# Patient Record
Sex: Male | Born: 1950 | Race: Black or African American | Hispanic: No | Smoking: Former smoker
Health system: Southern US, Community
[De-identification: ages and names within clinical notes are randomized; demographics above are authoritative.]

## PROBLEM LIST (undated history)

## (undated) DIAGNOSIS — Z86010 Personal history of colonic polyps: Secondary | ICD-10-CM

## (undated) DIAGNOSIS — C61 Malignant neoplasm of prostate: Secondary | ICD-10-CM

## (undated) DIAGNOSIS — M5030 Other cervical disc degeneration, unspecified cervical region: Secondary | ICD-10-CM

## (undated) DIAGNOSIS — Z8679 Personal history of other diseases of the circulatory system: Secondary | ICD-10-CM

## (undated) DIAGNOSIS — R972 Elevated prostate specific antigen [PSA]: Secondary | ICD-10-CM

## (undated) DIAGNOSIS — Z973 Presence of spectacles and contact lenses: Secondary | ICD-10-CM

## (undated) DIAGNOSIS — J449 Chronic obstructive pulmonary disease, unspecified: Secondary | ICD-10-CM

## (undated) DIAGNOSIS — Z87898 Personal history of other specified conditions: Secondary | ICD-10-CM

## (undated) DIAGNOSIS — K219 Gastro-esophageal reflux disease without esophagitis: Secondary | ICD-10-CM

## (undated) DIAGNOSIS — N138 Other obstructive and reflux uropathy: Secondary | ICD-10-CM

## (undated) DIAGNOSIS — N401 Enlarged prostate with lower urinary tract symptoms: Secondary | ICD-10-CM

## (undated) DIAGNOSIS — I2510 Atherosclerotic heart disease of native coronary artery without angina pectoris: Secondary | ICD-10-CM

## (undated) DIAGNOSIS — K5730 Diverticulosis of large intestine without perforation or abscess without bleeding: Secondary | ICD-10-CM

## (undated) HISTORY — PX: COLONOSCOPY: SHX174

## (undated) HISTORY — DX: Gastro-esophageal reflux disease without esophagitis: K21.9

## (undated) HISTORY — DX: Chronic obstructive pulmonary disease, unspecified: J44.9

## (undated) HISTORY — DX: Atherosclerotic heart disease of native coronary artery without angina pectoris: I25.10

## (undated) HISTORY — PX: CARDIAC CATHETERIZATION: SHX172

---

## 1998-05-05 ENCOUNTER — Ambulatory Visit (HOSPITAL_COMMUNITY): Admission: RE | Admit: 1998-05-05 | Discharge: 1998-05-05 | Payer: Self-pay | Admitting: Gastroenterology

## 1998-06-19 ENCOUNTER — Emergency Department (HOSPITAL_COMMUNITY): Admission: EM | Admit: 1998-06-19 | Discharge: 1998-06-19 | Payer: Self-pay | Admitting: Emergency Medicine

## 1998-09-21 ENCOUNTER — Emergency Department (HOSPITAL_COMMUNITY): Admission: EM | Admit: 1998-09-21 | Discharge: 1998-09-21 | Payer: Self-pay | Admitting: Emergency Medicine

## 1999-01-18 ENCOUNTER — Emergency Department (HOSPITAL_COMMUNITY): Admission: EM | Admit: 1999-01-18 | Discharge: 1999-01-18 | Payer: Self-pay

## 1999-02-28 ENCOUNTER — Encounter: Payer: Self-pay | Admitting: Emergency Medicine

## 1999-02-28 ENCOUNTER — Emergency Department (HOSPITAL_COMMUNITY): Admission: EM | Admit: 1999-02-28 | Discharge: 1999-03-01 | Payer: Self-pay | Admitting: Emergency Medicine

## 1999-03-15 ENCOUNTER — Emergency Department (HOSPITAL_COMMUNITY): Admission: EM | Admit: 1999-03-15 | Discharge: 1999-03-15 | Payer: Self-pay | Admitting: Emergency Medicine

## 1999-11-16 ENCOUNTER — Emergency Department (HOSPITAL_COMMUNITY): Admission: EM | Admit: 1999-11-16 | Discharge: 1999-11-16 | Payer: Self-pay | Admitting: *Deleted

## 2000-10-31 ENCOUNTER — Emergency Department (HOSPITAL_COMMUNITY): Admission: EM | Admit: 2000-10-31 | Discharge: 2000-10-31 | Payer: Self-pay | Admitting: Internal Medicine

## 2000-10-31 ENCOUNTER — Encounter: Payer: Self-pay | Admitting: Emergency Medicine

## 2000-11-06 ENCOUNTER — Encounter: Payer: Self-pay | Admitting: Gastroenterology

## 2000-11-06 ENCOUNTER — Ambulatory Visit (HOSPITAL_COMMUNITY): Admission: RE | Admit: 2000-11-06 | Discharge: 2000-11-06 | Payer: Self-pay | Admitting: Gastroenterology

## 2001-05-24 ENCOUNTER — Emergency Department (HOSPITAL_COMMUNITY): Admission: EM | Admit: 2001-05-24 | Discharge: 2001-05-24 | Payer: Self-pay | Admitting: Emergency Medicine

## 2001-05-29 ENCOUNTER — Emergency Department (HOSPITAL_COMMUNITY): Admission: EM | Admit: 2001-05-29 | Discharge: 2001-05-29 | Payer: Self-pay | Admitting: Emergency Medicine

## 2001-05-29 ENCOUNTER — Encounter: Payer: Self-pay | Admitting: Emergency Medicine

## 2001-06-12 ENCOUNTER — Encounter: Payer: Self-pay | Admitting: Gastroenterology

## 2001-06-12 ENCOUNTER — Encounter: Admission: RE | Admit: 2001-06-12 | Discharge: 2001-06-12 | Payer: Self-pay | Admitting: Gastroenterology

## 2002-03-21 ENCOUNTER — Emergency Department (HOSPITAL_COMMUNITY): Admission: EM | Admit: 2002-03-21 | Discharge: 2002-03-21 | Payer: Self-pay | Admitting: Emergency Medicine

## 2002-03-29 ENCOUNTER — Emergency Department (HOSPITAL_COMMUNITY): Admission: EM | Admit: 2002-03-29 | Discharge: 2002-03-29 | Payer: Self-pay | Admitting: Emergency Medicine

## 2002-04-14 ENCOUNTER — Emergency Department (HOSPITAL_COMMUNITY): Admission: EM | Admit: 2002-04-14 | Discharge: 2002-04-14 | Payer: Self-pay | Admitting: Emergency Medicine

## 2002-04-14 ENCOUNTER — Encounter: Payer: Self-pay | Admitting: *Deleted

## 2002-06-22 ENCOUNTER — Emergency Department (HOSPITAL_COMMUNITY): Admission: EM | Admit: 2002-06-22 | Discharge: 2002-06-22 | Payer: Self-pay | Admitting: Emergency Medicine

## 2002-06-22 ENCOUNTER — Encounter: Payer: Self-pay | Admitting: Emergency Medicine

## 2002-07-29 ENCOUNTER — Emergency Department (HOSPITAL_COMMUNITY): Admission: EM | Admit: 2002-07-29 | Discharge: 2002-07-29 | Payer: Self-pay | Admitting: Emergency Medicine

## 2002-07-29 ENCOUNTER — Encounter: Payer: Self-pay | Admitting: Emergency Medicine

## 2003-01-08 ENCOUNTER — Ambulatory Visit (HOSPITAL_COMMUNITY): Admission: RE | Admit: 2003-01-08 | Discharge: 2003-01-08 | Payer: Self-pay | Admitting: *Deleted

## 2003-01-08 ENCOUNTER — Encounter: Payer: Self-pay | Admitting: *Deleted

## 2003-01-18 ENCOUNTER — Emergency Department (HOSPITAL_COMMUNITY): Admission: EM | Admit: 2003-01-18 | Discharge: 2003-01-18 | Payer: Self-pay | Admitting: Emergency Medicine

## 2003-02-14 ENCOUNTER — Emergency Department (HOSPITAL_COMMUNITY): Admission: EM | Admit: 2003-02-14 | Discharge: 2003-02-14 | Payer: Self-pay | Admitting: Emergency Medicine

## 2003-02-14 ENCOUNTER — Encounter: Payer: Self-pay | Admitting: Emergency Medicine

## 2003-06-02 ENCOUNTER — Emergency Department (HOSPITAL_COMMUNITY): Admission: EM | Admit: 2003-06-02 | Discharge: 2003-06-02 | Payer: Self-pay | Admitting: Emergency Medicine

## 2003-06-12 ENCOUNTER — Emergency Department (HOSPITAL_COMMUNITY): Admission: EM | Admit: 2003-06-12 | Discharge: 2003-06-12 | Payer: Self-pay | Admitting: Emergency Medicine

## 2003-06-13 ENCOUNTER — Emergency Department (HOSPITAL_COMMUNITY): Admission: EM | Admit: 2003-06-13 | Discharge: 2003-06-13 | Payer: Self-pay | Admitting: Emergency Medicine

## 2005-11-06 ENCOUNTER — Emergency Department (HOSPITAL_COMMUNITY): Admission: EM | Admit: 2005-11-06 | Discharge: 2005-11-06 | Payer: Self-pay | Admitting: Emergency Medicine

## 2005-12-04 ENCOUNTER — Emergency Department (HOSPITAL_COMMUNITY): Admission: EM | Admit: 2005-12-04 | Discharge: 2005-12-04 | Payer: Self-pay | Admitting: Emergency Medicine

## 2006-05-01 ENCOUNTER — Emergency Department (HOSPITAL_COMMUNITY): Admission: EM | Admit: 2006-05-01 | Discharge: 2006-05-02 | Payer: Self-pay | Admitting: Emergency Medicine

## 2007-06-30 ENCOUNTER — Emergency Department (HOSPITAL_COMMUNITY): Admission: EM | Admit: 2007-06-30 | Discharge: 2007-06-30 | Payer: Self-pay | Admitting: Emergency Medicine

## 2007-11-15 ENCOUNTER — Emergency Department (HOSPITAL_COMMUNITY): Admission: EM | Admit: 2007-11-15 | Discharge: 2007-11-15 | Payer: Self-pay | Admitting: Emergency Medicine

## 2007-11-22 ENCOUNTER — Emergency Department (HOSPITAL_COMMUNITY): Admission: EM | Admit: 2007-11-22 | Discharge: 2007-11-22 | Payer: Self-pay | Admitting: Emergency Medicine

## 2007-12-14 ENCOUNTER — Emergency Department (HOSPITAL_COMMUNITY): Admission: EM | Admit: 2007-12-14 | Discharge: 2007-12-14 | Payer: Self-pay | Admitting: Emergency Medicine

## 2007-12-23 DIAGNOSIS — J449 Chronic obstructive pulmonary disease, unspecified: Secondary | ICD-10-CM

## 2007-12-23 DIAGNOSIS — J45909 Unspecified asthma, uncomplicated: Secondary | ICD-10-CM | POA: Insufficient documentation

## 2007-12-23 DIAGNOSIS — J4489 Other specified chronic obstructive pulmonary disease: Secondary | ICD-10-CM | POA: Insufficient documentation

## 2007-12-23 DIAGNOSIS — K219 Gastro-esophageal reflux disease without esophagitis: Secondary | ICD-10-CM | POA: Insufficient documentation

## 2007-12-24 ENCOUNTER — Ambulatory Visit: Payer: Self-pay | Admitting: Internal Medicine

## 2007-12-24 DIAGNOSIS — I10 Essential (primary) hypertension: Secondary | ICD-10-CM | POA: Insufficient documentation

## 2008-02-23 ENCOUNTER — Emergency Department (HOSPITAL_COMMUNITY): Admission: EM | Admit: 2008-02-23 | Discharge: 2008-02-24 | Payer: Self-pay | Admitting: Emergency Medicine

## 2008-03-05 ENCOUNTER — Emergency Department (HOSPITAL_COMMUNITY): Admission: EM | Admit: 2008-03-05 | Discharge: 2008-03-06 | Payer: Self-pay | Admitting: Emergency Medicine

## 2008-03-22 ENCOUNTER — Telehealth (INDEPENDENT_AMBULATORY_CARE_PROVIDER_SITE_OTHER): Payer: Self-pay | Admitting: *Deleted

## 2008-03-30 ENCOUNTER — Emergency Department (HOSPITAL_COMMUNITY): Admission: EM | Admit: 2008-03-30 | Discharge: 2008-03-30 | Payer: Self-pay | Admitting: Emergency Medicine

## 2008-07-23 ENCOUNTER — Ambulatory Visit: Payer: Self-pay | Admitting: Internal Medicine

## 2008-11-08 ENCOUNTER — Telehealth (INDEPENDENT_AMBULATORY_CARE_PROVIDER_SITE_OTHER): Payer: Self-pay | Admitting: *Deleted

## 2008-12-14 ENCOUNTER — Telehealth (INDEPENDENT_AMBULATORY_CARE_PROVIDER_SITE_OTHER): Payer: Self-pay | Admitting: *Deleted

## 2009-02-14 ENCOUNTER — Emergency Department (HOSPITAL_COMMUNITY): Admission: EM | Admit: 2009-02-14 | Discharge: 2009-02-14 | Payer: Self-pay | Admitting: Emergency Medicine

## 2009-05-30 ENCOUNTER — Telehealth: Payer: Self-pay | Admitting: Internal Medicine

## 2009-11-29 ENCOUNTER — Emergency Department (HOSPITAL_COMMUNITY): Admission: EM | Admit: 2009-11-29 | Discharge: 2009-11-30 | Payer: Self-pay | Admitting: Emergency Medicine

## 2010-08-05 ENCOUNTER — Emergency Department (HOSPITAL_COMMUNITY): Admission: EM | Admit: 2010-08-05 | Discharge: 2010-08-05 | Payer: Self-pay | Admitting: Emergency Medicine

## 2010-11-21 LAB — BASIC METABOLIC PANEL WITH GFR
BUN: 5 mg/dL — ABNORMAL LOW (ref 6–23)
CO2: 30 meq/L (ref 19–32)
Calcium: 9.2 mg/dL (ref 8.4–10.5)
Chloride: 101 meq/L (ref 96–112)
Creatinine, Ser: 0.91 mg/dL (ref 0.4–1.5)
GFR calc non Af Amer: >60 mL/min
Glucose, Bld: 72 mg/dL (ref 70–99)
Potassium: 3.7 meq/L (ref 3.5–5.1)
Sodium: 136 meq/L (ref 135–145)

## 2010-11-21 LAB — CBC
HCT: 40.2 % (ref 39.0–52.0)
Hemoglobin: 13.5 g/dL (ref 13.0–17.0)
MCH: 27.6 pg (ref 26.0–34.0)
MCHC: 33.6 g/dL (ref 30.0–36.0)
MCV: 82 fL (ref 78.0–100.0)
Platelets: 226 K/uL (ref 150–400)
RBC: 4.9 MIL/uL (ref 4.22–5.81)
RDW: 13 % (ref 11.5–15.5)
WBC: 4 K/uL (ref 4.0–10.5)

## 2010-11-21 LAB — CK TOTAL AND CKMB (NOT AT ARMC)
Relative Index: 1.7 (ref 0.0–2.5)
Total CK: 111 U/L (ref 7–232)

## 2010-11-21 LAB — DIFFERENTIAL
Basophils Relative: 2 % — ABNORMAL HIGH (ref 0–1)
Monocytes Relative: 10 % (ref 3–12)
Neutrophils Relative %: 52 % (ref 43–77)

## 2010-11-21 LAB — TROPONIN I: Troponin I: 0.02 ng/mL (ref 0.00–0.06)

## 2010-12-04 LAB — CBC
Hemoglobin: 13 g/dL (ref 13.0–17.0)
MCHC: 33.4 g/dL (ref 30.0–36.0)
MCV: 85.8 fL (ref 78.0–100.0)
Platelets: 200 10*3/uL (ref 150–400)
RBC: 4.52 MIL/uL (ref 4.22–5.81)
RDW: 13.6 % (ref 11.5–15.5)

## 2010-12-04 LAB — DIFFERENTIAL
Basophils Absolute: 0 10*3/uL (ref 0.0–0.1)
Eosinophils Absolute: 0.2 10*3/uL (ref 0.0–0.7)
Eosinophils Relative: 7 % — ABNORMAL HIGH (ref 0–5)
Lymphs Abs: 1.4 10*3/uL (ref 0.7–4.0)
Neutro Abs: 1.4 10*3/uL — ABNORMAL LOW (ref 1.7–7.7)
Neutrophils Relative %: 42 % — ABNORMAL LOW (ref 43–77)

## 2010-12-04 LAB — BASIC METABOLIC PANEL
BUN: 7 mg/dL (ref 6–23)
CO2: 25 mEq/L (ref 19–32)
GFR calc non Af Amer: >60 mL/min (ref >60)
Glucose, Bld: 98 mg/dL (ref 70–99)
Sodium: 135 mEq/L (ref 135–145)

## 2010-12-04 LAB — TROPONIN I: Troponin I: 0.04 ng/mL (ref 0.00–0.06)

## 2010-12-04 LAB — CK TOTAL AND CKMB (NOT AT ARMC): CK, MB: 2.9 ng/mL (ref 0.3–4.0)

## 2011-01-26 NOTE — Cardiovascular Report (Signed)
[name], [name]                           ACCOUNT NO.:  [id number]   MEDICAL RECORD NO.:  [id number]                   PATIENT TYPE:  OIB   LOCATION:  2860                                 FACILITY:  [facility]   PHYSICIAN:  Veneda Melter, M.D.                   DATE OF BIRTH:  Dec 12, 1950   DATE OF PROCEDURE:  01/08/2003  DATE OF DISCHARGE:  01/08/2003                              CARDIAC CATHETERIZATION   PROCEDURE PERFORMED:  1. Left heart catheterization.  2. Left ventriculogram.  3. Selective coronary angiography.  4. Attempted placement, Perclose, right femoral artery.   DIAGNOSES:  1. Mild coronary artery disease.  2. Normal left ventricular systolic function.   HISTORY:  The patient is a 60 year old black gentleman who presents with  substernal chest discomfort and shortness of breath.  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  He presents for further assessment.   TECHNIQUE:  Informed consent was obtained.  The patient was brought to the  catheterization lab.  A 6-French sheath was placed in the right femoral  artery using the modified Seldinger technique.  JL4 and JR4 6-French  catheters were then used to engage the left and right coronary arteries, and  selective angiography was performed in various projections using manual  injections of contrast.  A 6-French pigtail catheter was then advanced to  the left ventricle, and a left ventriculogram performed using power  injections of contrast.  At the termination of the case, an attempt was made  to place a Perclose suture closure device in the right femoral artery;  however, hemostasis could not be achieved, and manual pressure was applied.  Hemostasis was then achieved, and the patient was transferred to the  outpatient center in stable condition.  Findings are as follows:   FINDINGS:  1. Left main trunk:  Angiographically normal.  2. LAD:  This is a medium caliber vessel that provides 3 small diagonal     branches in its mid section.  The LAD has mild narrowing of 30% in the     mid section.  The remainder of the vessel has luminal irregularities.  3. Left circumflex artery:  This is a medium caliber vessel that provides 3     marginal branches in the proximal segment.  The left circumflex system     has mild irregularities.  4. Right coronary artery:  Dominant.  This is a medium caliber vessel that     provides a posterior descending artery and posterior ventricular branch     in the terminal segment.  The right coronary artery has luminal disease.  5. LV:  Normal end systolic and end diastolic dimensions.  Overall left     ventricular function appears well preserved.  Ejection fraction of     greater than 55%.  No  mitral regurgitation.   HEMODYNAMICS:  1. LV pressure is 140/0.  2. Aortic is 140/80.  3. LVEDP equals 20.    ASSESSMENT AND PLAN:  The patient is a 60 year old gentleman with mild  coronary artery disease and normal LV function.  Continued medical therapy  will be recommended and other causes of his symptoms investigated.                                                Veneda Melter, M.D.    Melton Alar  D:  01/08/2003  T:  01/09/2003  Job:  [id number]   cc:   Ulyess Mort, M.D. [facility]   Lindaann Slough, M.D.  [address]  [place]  [place] [address]  Fax: [phone]

## 2011-02-22 ENCOUNTER — Emergency Department (HOSPITAL_COMMUNITY)
Admission: EM | Admit: 2011-02-22 | Discharge: 2011-02-22 | Disposition: A | Discharge status: Home / Self Care | Payer: BC Managed Care – PPO | Attending: Emergency Medicine | Admitting: Emergency Medicine

## 2011-02-22 DIAGNOSIS — S61209A Unspecified open wound of unspecified finger without damage to nail, initial encounter: Secondary | ICD-10-CM | POA: Insufficient documentation

## 2011-02-22 DIAGNOSIS — W260XXA Contact with knife, initial encounter: Secondary | ICD-10-CM | POA: Insufficient documentation

## 2011-02-22 DIAGNOSIS — I2510 Atherosclerotic heart disease of native coronary artery without angina pectoris: Secondary | ICD-10-CM | POA: Insufficient documentation

## 2011-02-22 DIAGNOSIS — I519 Heart disease, unspecified: Secondary | ICD-10-CM | POA: Insufficient documentation

## 2011-02-22 DIAGNOSIS — J45909 Unspecified asthma, uncomplicated: Secondary | ICD-10-CM | POA: Insufficient documentation

## 2011-02-22 DIAGNOSIS — K219 Gastro-esophageal reflux disease without esophagitis: Secondary | ICD-10-CM | POA: Insufficient documentation

## 2011-02-22 DIAGNOSIS — M79609 Pain in unspecified limb: Secondary | ICD-10-CM | POA: Insufficient documentation

## 2011-02-22 DIAGNOSIS — W261XXA Contact with sword or dagger, initial encounter: Secondary | ICD-10-CM | POA: Insufficient documentation

## 2011-02-22 DIAGNOSIS — I10 Essential (primary) hypertension: Secondary | ICD-10-CM | POA: Insufficient documentation

## 2011-05-16 ENCOUNTER — Encounter: Payer: Self-pay | Admitting: Internal Medicine

## 2011-06-04 LAB — I-STAT 8, (EC8 V) (CONVERTED LAB)
Chloride: 105
Glucose, Bld: 107 — ABNORMAL HIGH
Potassium: 3.6
TCO2: 27

## 2011-06-04 LAB — DIFFERENTIAL
Basophils Absolute: 0.1
Basophils Relative: 1
Eosinophils Absolute: 0.4
Lymphocytes Relative: 38
Lymphs Abs: 2
Monocytes Absolute: 0.4
Neutrophils Relative %: 45

## 2011-06-04 LAB — POCT CARDIAC MARKERS
CKMB, poc: 2.4
Myoglobin, poc: 67
Operator id: 294341
Troponin i, poc: <0.05

## 2011-06-04 LAB — CBC
MCV: 84
Platelets: 238
WBC: 5.2

## 2011-06-04 LAB — PROTIME-INR
INR: 1
Prothrombin Time: 13

## 2011-06-05 LAB — CBC
HCT: 41.2
Hemoglobin: 13.7
MCHC: 33.2
MCV: 84
RBC: 4.91
RDW: 13.9
WBC: 4.5

## 2011-06-05 LAB — POCT I-STAT, CHEM 8
Calcium, Ion: 1.21
Chloride: 104
Creatinine, Ser: 1
Hemoglobin: 15
Sodium: 142

## 2011-06-05 LAB — DIFFERENTIAL
Basophils Absolute: 0.1
Basophils Relative: 3 — ABNORMAL HIGH
Monocytes Relative: 11
Neutro Abs: 1.7

## 2011-06-05 LAB — POCT CARDIAC MARKERS: Troponin i, poc: <0.05

## 2011-06-26 ENCOUNTER — Encounter: Payer: Self-pay | Admitting: Internal Medicine

## 2011-08-01 ENCOUNTER — Encounter: Payer: Self-pay | Admitting: Internal Medicine

## 2011-08-01 ENCOUNTER — Ambulatory Visit (INDEPENDENT_AMBULATORY_CARE_PROVIDER_SITE_OTHER): Payer: BC Managed Care – PPO | Admitting: Internal Medicine

## 2011-08-01 VITALS — BP 124/90 | HR 72 | Ht 63.0 in | Wt 113.6 lb

## 2011-08-01 DIAGNOSIS — I2510 Atherosclerotic heart disease of native coronary artery without angina pectoris: Secondary | ICD-10-CM

## 2011-08-01 DIAGNOSIS — K219 Gastro-esophageal reflux disease without esophagitis: Secondary | ICD-10-CM

## 2011-08-01 DIAGNOSIS — Z86010 Personal history of colonic polyps: Secondary | ICD-10-CM

## 2011-08-01 DIAGNOSIS — J449 Chronic obstructive pulmonary disease, unspecified: Secondary | ICD-10-CM

## 2011-08-01 NOTE — Progress Notes (Signed)
HISTORY OF PRESENT ILLNESS:  [name] is a 60 y.o. male with hypertension, coronary artery disease, COPD, and GERD. He presents to the regarding surveillance colonoscopy. As well some complaints of acid reflux. The patient has previously been under the GI care of Dr. Terrial Rhodes. He has had multiple colonoscopies with documented adenomatous polyps, dating back to 24. Most recent colonoscopy performed September 2005. No polyps at that time. Followup in 7 years recommended. The patient denies any lower GI complaints. He does complain of intermittent pyrosis and regurgitation. No dysphagia. He has also undergone previous upper endoscopy with esophageal dilation. His chronic medical problems are reported as stable.  REVIEW OF SYSTEMS:  All non-GI ROS negative except for difficulty initiating urinary stream  Past Medical History  Diagnosis Date  . Hypertension   . COPD (chronic obstructive pulmonary disease)   . CAD (coronary artery disease)   . GERD (gastroesophageal reflux disease)     No past surgical history on file.  Social History [name]  reports that he has quit smoking. He has never used smokeless tobacco. He reports that he does not drink alcohol or use illicit drugs.  family history includes Heart disease in his brother and sister.  No Known Allergies     PHYSICAL EXAMINATION: Vital signs: BP 124/90  Pulse 72  Ht 5\' 3"  (1.6 m)  Wt 113 lb 9.6 oz (51.529 kg)  BMI 20.12 kg/m2  Constitutional: generally well-appearing, no acute distress Psychiatric: alert and oriented x3, cooperative Eyes: extraocular movements intact, anicteric, conjunctiva pink Mouth: oral pharynx moist, no lesions Neck: supple no lymphadenopathy Cardiovascular: heart regular rate and rhythm, no murmur Lungs: clear to auscultation bilaterally Abdomen: soft, nontender, nondistended, no obvious ascites, no peritoneal signs, normal bowel sounds, no organomegaly Rectal: Deferred until  colonoscopy Extremities: no lower extremity edema bilaterally Skin: no lesions on visible extremities Neuro: No focal deficits.   ASSESSMENT:  #1. GERD without alarm features #2. History of adenomatous colon polyps. Due for surveillance #3. Multiple significant medical problems, stable   PLAN:  #1. Reflux precautions #2. Prilosec OTC on demand #3. Surveillance colonoscopy.The nature of the procedure, as well as the risks, benefits, and alternatives were carefully and thoroughly reviewed with the patient. Ample time for discussion and questions allowed. The patient understood, was satisfied, and agreed to proceed. The prep prescribed is Movi prep. The patient instructed on its use #4. Ongoing general medical care with your  PCP

## 2011-08-01 NOTE — Patient Instructions (Signed)
You have been scheduled for a colonoscopy. Please follow written instructions given to you at your visit today.  Please pick up your prep kit at the pharmacy within the next 2-3 days. 

## 2011-08-12 ENCOUNTER — Emergency Department (HOSPITAL_COMMUNITY): Payer: BC Managed Care – PPO

## 2011-08-12 ENCOUNTER — Emergency Department (HOSPITAL_COMMUNITY)
Admission: EM | Admit: 2011-08-12 | Discharge: 2011-08-12 | Disposition: A | Discharge status: Home / Self Care | Payer: BC Managed Care – PPO | Attending: Emergency Medicine | Admitting: Emergency Medicine

## 2011-08-12 ENCOUNTER — Encounter (HOSPITAL_COMMUNITY): Payer: Self-pay

## 2011-08-12 DIAGNOSIS — R059 Cough, unspecified: Secondary | ICD-10-CM | POA: Insufficient documentation

## 2011-08-12 DIAGNOSIS — R51 Headache: Secondary | ICD-10-CM

## 2011-08-12 DIAGNOSIS — R05 Cough: Secondary | ICD-10-CM | POA: Insufficient documentation

## 2011-08-12 DIAGNOSIS — R0602 Shortness of breath: Secondary | ICD-10-CM | POA: Insufficient documentation

## 2011-08-12 DIAGNOSIS — J4489 Other specified chronic obstructive pulmonary disease: Secondary | ICD-10-CM | POA: Insufficient documentation

## 2011-08-12 DIAGNOSIS — J449 Chronic obstructive pulmonary disease, unspecified: Secondary | ICD-10-CM

## 2011-08-12 DIAGNOSIS — I10 Essential (primary) hypertension: Secondary | ICD-10-CM | POA: Insufficient documentation

## 2011-08-12 MED ORDER — METOCLOPRAMIDE HCL 5 MG/ML IJ SOLN
10.0000 mg | Freq: Once | INTRAMUSCULAR | Status: DC
  Filled 2011-08-12: qty 2

## 2011-08-12 MED ORDER — ALBUTEROL SULFATE HFA 108 (90 BASE) MCG/ACT IN AERS
2.0000 | INHALATION_SPRAY | Freq: Once | RESPIRATORY_TRACT | Status: AC
  Administered 2011-08-12: 2 via RESPIRATORY_TRACT
  Filled 2011-08-12: qty 6.7

## 2011-08-12 MED ORDER — METOCLOPRAMIDE HCL 5 MG/ML IJ SOLN
10.0000 mg | Freq: Once | INTRAMUSCULAR | Status: AC
  Administered 2011-08-12: 10 mg via INTRAMUSCULAR

## 2011-08-12 NOTE — ED Provider Notes (Signed)
Medical screening examination/treatment/procedure(s) were conducted as a shared visit with non-physician practitioner(s) and myself.  I personally evaluated the patient during the encounter  60 year old male complains of a headache for the last week.  No other symptoms.  No fever.  No history of trauma.  He denies dizziness, vision changes, weakness in his arms or legs.  He denies nausea or vomiting.  He rarely gets headaches.  His neurological examination and mental status are completely normal.  Head CT shows asymmetry of the lateral ventricles, but no other abnormalities.  Consultation with the neurosurgeon was performed and the recommendation was that he gets no further radiographic testing  Nicholes Stairs, MD 08/12/11 1345

## 2011-08-12 NOTE — ED Notes (Signed)
Pt in from home with cold like sx and generalized body aches x1 week denies n/v/d no apparent distress

## 2011-08-12 NOTE — ED Provider Notes (Signed)
History     CSN: [id number] Arrival date & time: 08/12/2011  8:36 AM   First MD Initiated Contact with Patient 08/12/11 1008      Chief Complaint  Patient presents with  . URI    (Consider location/radiation/quality/duration/timing/severity/associated sxs/prior treatment) HPI History provided by pt.   Pt has had a severe, intermittent headache for the past 7 days.  Has temporary relief w/ tylenol.  No associated sx including fever, dizziness, blurred vision, photo/phonophobia, N/V, weakness, paresthesias.  Has not had a headache like this in several yrs.  No h/o migraines.  Denies head trauma.  Also c/o cough; onset approx same time as headache.  Non-productive.  Associated w/ mild, exertional SOB, wheezing, rhinorrhea and right ear pain.  H/o COPD and this feels like an exacerbation. Also has HTN for which he is non-compliant w/ medications.  Otherwise healthy.  No h/o cancer.  Past Medical History  Diagnosis Date  . Hypertension   . COPD (chronic obstructive pulmonary disease)   . CAD (coronary artery disease)   . GERD (gastroesophageal reflux disease)     History reviewed. No pertinent past surgical history.  Family History  Problem Relation Age of Onset  . Heart disease Brother   . Heart disease Sister     History  Substance Use Topics  . Smoking status: Former Games developer  . Smokeless tobacco: Never Used   Comment: Quit in 1988  . Alcohol Use: No      Review of Systems  All other systems reviewed and are negative.    Allergies  Review of patient's allergies indicates no known allergies.  Home Medications   Current Outpatient Rx  Name Route Sig Dispense Refill  . NON FORMULARY  Prostate medication, patient unable to remember name       BP 142/98  Pulse 75  Temp(Src) 98.6 F (37 C) (Oral)  Resp 20  SpO2 96%  Physical Exam  Nursing note and vitals reviewed. Constitutional: He is oriented to person, place, and time. He appears well-developed and  well-nourished.  HENT:  Head: Normocephalic and atraumatic.  Right Ear: External ear normal.       Right TM nml.  No edema, erythema or tenderness of mastoid.  Pinna nml.   Eyes:       Normal appearance  Neck: Normal range of motion. Neck supple. No rigidity. No Brudzinski's sign and no Kernig's sign noted.  Cardiovascular: Normal rate and regular rhythm.   Pulmonary/Chest: Effort normal and breath sounds normal.  Neurological: He is alert and oriented to person, place, and time. No cranial nerve deficit or sensory deficit. Coordination normal.       5/5 and equal upper and lower extremity strength.  No past pointing.  Nml gait.  Skin: Skin is warm and dry. No rash noted.  Psychiatric: He has a normal mood and affect. His behavior is normal.    ED Course  Procedures (including critical care time)  Date: 08/12/2011  Rate: 67  Rhythm: normal sinus rhythm  QRS Axis: left  Intervals: normal  ST/T Wave abnormalities: normal  Conduction Disutrbances:left anterior fascicular block and early precordial R/S transition  Narrative Interpretation:   Old EKG Reviewed: none available   Labs Reviewed - No data to display Dg Chest 2 View  08/12/2011  *RADIOLOGY REPORT*  Clinical Data: Cough, shortness of breath.  CHEST - 2 VIEW  Comparison: 08/05/2010  Findings: Mild hyperaeration, stable.  Peribronchial thickening again noted, also stable.  Heart is normal size.  Lungs are clear. No effusions or acute bony abnormality.  IMPRESSION: Suspect chronic bronchitic changes or asthma.  No active disease.  Original Report Authenticated By: Cyndie Chime, M.D.   Ct Head Wo Contrast  08/12/2011  *RADIOLOGY REPORT*  Clinical Data: Headache.  CT HEAD WITHOUT CONTRAST  Technique:  Contiguous axial images were obtained from the base of the skull through the vertex without contrast.  Comparison: None.  Findings: There is asymmetric size of the lateral ventricles with the left lateral ventricle slightly larger  than the right.  This is likely a normal variant or related to intraventricular cyst. If there are is concern for a increased intracranial pressure, MRI may be helpful.  No acute infarct or hemorrhage.  No mass lesion.  No acute calvarial abnormality. Visualized paranasal sinuses and mastoids clear.  Orbital soft tissues unremarkable.  IMPRESSION: Asymmetric size of the lateral ventricles, left larger than right. This may be a normal variant or related to intraventricular cyst. If there is concern that the patient's headache may be related to increased intracranial pressure, MRI may be helpful.  Original Report Authenticated By: Cyndie Chime, M.D.     1. Headache   2. COPD (chronic obstructive pulmonary disease)       MDM  Pt presents w/ diffuse, intermittent, non-traumatic headache x 1 week.  Afebrile, no meningeal signs or neuro deficits on exam.  B/c new onset and age of pt, CT head ordered and pending.  Pt to received 10mg  IM reglan for pain.  Also c/o non-productive cough x 1wk.  Associated w/ mild, exertional SOB and wheezing.  Feels like a COPD exacerbation.  CXR pending.  CXR shows chronic bronchitis changes; no pneumonia.  CT head shows asymmetric size lateral ventricles; nml variant vs. Intraventricular cyst.  Consulted Dr. Newell Coral who has reviewed his CT and does not believe he needs an MRI nor outpatient NS f/u.  Discussed w/ pt.  Return precautions given.  Received an albuterol inhaler for cough.       Arie Sabina [place], [place] 08/12/11 [phone]

## 2011-08-20 ENCOUNTER — Telehealth: Payer: Self-pay | Admitting: Internal Medicine

## 2011-08-20 NOTE — Telephone Encounter (Signed)
No charge. 

## 2011-08-22 ENCOUNTER — Other Ambulatory Visit: Payer: BC Managed Care – PPO | Admitting: Internal Medicine

## 2012-05-29 ENCOUNTER — Encounter: Payer: Self-pay | Admitting: Internal Medicine

## 2012-10-07 ENCOUNTER — Encounter (HOSPITAL_COMMUNITY): Payer: Self-pay | Admitting: Emergency Medicine

## 2012-10-07 ENCOUNTER — Emergency Department (HOSPITAL_COMMUNITY): Payer: BC Managed Care – PPO

## 2012-10-07 ENCOUNTER — Emergency Department (HOSPITAL_COMMUNITY)
Admission: EM | Admit: 2012-10-07 | Discharge: 2012-10-07 | Disposition: A | Discharge status: Home / Self Care | Payer: BC Managed Care – PPO | Attending: Emergency Medicine | Admitting: Emergency Medicine

## 2012-10-07 DIAGNOSIS — Z87891 Personal history of nicotine dependence: Secondary | ICD-10-CM | POA: Insufficient documentation

## 2012-10-07 DIAGNOSIS — J449 Chronic obstructive pulmonary disease, unspecified: Secondary | ICD-10-CM | POA: Insufficient documentation

## 2012-10-07 DIAGNOSIS — I2510 Atherosclerotic heart disease of native coronary artery without angina pectoris: Secondary | ICD-10-CM | POA: Insufficient documentation

## 2012-10-07 DIAGNOSIS — M25512 Pain in left shoulder: Secondary | ICD-10-CM

## 2012-10-07 DIAGNOSIS — Z8719 Personal history of other diseases of the digestive system: Secondary | ICD-10-CM | POA: Insufficient documentation

## 2012-10-07 DIAGNOSIS — Z79899 Other long term (current) drug therapy: Secondary | ICD-10-CM | POA: Insufficient documentation

## 2012-10-07 DIAGNOSIS — M25519 Pain in unspecified shoulder: Secondary | ICD-10-CM | POA: Insufficient documentation

## 2012-10-07 DIAGNOSIS — J4489 Other specified chronic obstructive pulmonary disease: Secondary | ICD-10-CM | POA: Insufficient documentation

## 2012-10-07 DIAGNOSIS — I10 Essential (primary) hypertension: Secondary | ICD-10-CM | POA: Insufficient documentation

## 2012-10-07 MED ORDER — DIAZEPAM 5 MG PO TABS: 5.0000 mg | ORAL_TABLET | Freq: Four times a day (QID) | ORAL | Status: DC | PRN

## 2012-10-07 MED ORDER — IBUPROFEN 800 MG PO TABS
800.0000 mg | ORAL_TABLET | Freq: Once | ORAL | Status: AC
  Administered 2012-10-07: 800 mg via ORAL
  Filled 2012-10-07: qty 1

## 2012-10-07 NOTE — ED Provider Notes (Signed)
History     CSN: [id number]  Arrival date & time 10/07/12  4098   None     Chief Complaint  Patient presents with  . Joint Pain    (Consider location/radiation/quality/duration/timing/severity/associated sxs/prior treatment) HPI  Stetson Pelaez is a 62 y.o. male complaining of right shoulder pain x3 days after raking. Pain is exacerbated by movement, it is worsened the evenings and improved in the a.m. He denies any other changes joint pain, he endorses a reduced range of motion denies numbness or paresthesia. Pain is rated at 10 out of 10 at worst. Thermal patches and APAP not helpful. No prior history of trauma to this shoulder.   Past Medical History  Diagnosis Date  . Hypertension   . COPD (chronic obstructive pulmonary disease)   . CAD (coronary artery disease)   . GERD (gastroesophageal reflux disease)     History reviewed. No pertinent past surgical history.  Family History  Problem Relation Age of Onset  . Heart disease Brother   . Heart disease Sister     History  Substance Use Topics  . Smoking status: Former Games developer  . Smokeless tobacco: Never Used     Comment: Quit in 1988  . Alcohol Use: No      Review of Systems  Constitutional: Negative for fever.  Respiratory: Negative for shortness of breath.   Cardiovascular: Negative for chest pain.  Gastrointestinal: Negative for nausea, vomiting, abdominal pain and diarrhea.  Musculoskeletal: Positive for arthralgias.  All other systems reviewed and are negative.    Allergies  Review of patient's allergies indicates no known allergies.  Home Medications   Current Outpatient Rx  Name  Route  Sig  Dispense  Refill  . ACETAMINOPHEN 500 MG PO TABS   Oral   Take 500 mg by mouth every 6 (six) hours as needed. For headache.          . ALBUTEROL SULFATE HFA 108 (90 BASE) MCG/ACT IN AERS   Inhalation   Inhale 2 puffs into the lungs every 4 (four) hours as needed. For shortness of breath.          Marland Kitchen  NON FORMULARY      Prostate medication, patient unable to remember name         . OVER THE COUNTER MEDICATION   Oral   Take 1 capsule by mouth daily. Prostate medication.          . TETRAHYDROZOLINE HCL 0.05 % OP SOLN   Both Eyes   Place 2 drops into both eyes as needed. For dry eyes.            BP 160/91  Pulse 68  Temp 98.8 F (37.1 C) (Oral)  Resp 14  SpO2 100%  Physical Exam  Nursing note and vitals reviewed. Constitutional: He is oriented to person, place, and time. He appears well-developed and well-nourished. No distress.  HENT:  Head: Normocephalic.  Eyes: Conjunctivae normal and EOM are normal.  Cardiovascular: Normal rate.   Pulmonary/Chest: Effort normal. No stridor.  Musculoskeletal: Normal range of motion.       Patient able to abduct left shoulder greater than 90 however it induces pain. Drop arm is negative. There is no swelling, erythema or warmth to the joint. Distal sensation is intact and radial pulses are 2+ bilaterally. Patient is diffusely tender to palpation of posterior shoulder up into the neck.  Neurological: He is alert and oriented to person, place, and time.  Psychiatric: He has a normal  mood and affect.    ED Course  Procedures (including critical care time)  Labs Reviewed - No data to display Dg Shoulder Right  10/07/2012  *RADIOLOGY REPORT*  Clinical Data: Joint pain, injured 2 weeks ago  RIGHT SHOULDER - 2+ VIEW  Comparison: Prior chest x-ray 08/12/2011.  Findings: Multiple views of the right shoulder including internal, external and scapular Y views are submitted for interpretation.  No evidence of acute fracture or malalignment.  The humeral head is located with respect to the glenoid.  On the internally rotated view there is some faint high density material in the region of the rotator cuff insertion which may represent some calcific tendinopathy.  The visualized lungs are clear.  Cardiac and mediastinal contours within normal limits.   Bony mineralization is within normal limits.  No aggressive lytic or blastic bony lesion identified.  IMPRESSION:  1.  No acute fracture or malalignment 2.  On a single view there is some relative high density in the region of the rotator cuff insertion which may represent developing calcific tendinopathy.   Original Report Authenticated By: Malachy Moan, M.D.      1. Arthralgia of left shoulder region       MDM  Physical exam is benign showing no issues with the rotator cuff or other physical abnormalities are reduced range of motion and tenderness. X-ray shows no bony abnormalities. This likely muscle spasm. I will refer him to or so and treatment such.   Pt verbalized understanding and agrees with care plan. Outpatient follow-up and return precautions given.    New Prescriptions   DIAZEPAM (VALIUM) 5 MG TABLET    Take 1 tablet (5 mg total) by mouth every 6 (six) hours as needed (spasms).          Wynetta Emery, PA-C 10/07/12 [phone]

## 2012-10-07 NOTE — ED Notes (Signed)
TRANSPORTED TO X-RAY. 

## 2012-10-07 NOTE — ED Notes (Signed)
PT. REPORTS RIGHT SHOULDER JOINT PAIN FOR 3 DAYS , PAIN WORSE WITH MOVEMENT  AND CERTAIN POSITIONS, DENIES INJURY OR FALL.

## 2012-10-07 NOTE — ED Provider Notes (Signed)
Medical screening examination/treatment/procedure(s) were performed by non-physician practitioner and as supervising physician I was immediately available for consultation/collaboration.  Olivia Mackie, MD 10/07/12 1530

## 2012-12-15 ENCOUNTER — Emergency Department (HOSPITAL_COMMUNITY)
Admission: EM | Admit: 2012-12-15 | Discharge: 2012-12-15 | Disposition: A | Discharge status: Home / Self Care | Payer: BC Managed Care – PPO | Attending: Emergency Medicine | Admitting: Emergency Medicine

## 2012-12-15 ENCOUNTER — Emergency Department (HOSPITAL_COMMUNITY): Payer: BC Managed Care – PPO

## 2012-12-15 ENCOUNTER — Encounter (HOSPITAL_COMMUNITY): Payer: Self-pay | Admitting: Emergency Medicine

## 2012-12-15 ENCOUNTER — Emergency Department (INDEPENDENT_AMBULATORY_CARE_PROVIDER_SITE_OTHER)
Admission: EM | Admit: 2012-12-15 | Discharge: 2012-12-15 | Disposition: A | Discharge status: Home / Self Care | Payer: BC Managed Care – PPO | Source: Home / Self Care

## 2012-12-15 ENCOUNTER — Encounter (HOSPITAL_COMMUNITY): Payer: Self-pay | Admitting: *Deleted

## 2012-12-15 DIAGNOSIS — Z8719 Personal history of other diseases of the digestive system: Secondary | ICD-10-CM | POA: Insufficient documentation

## 2012-12-15 DIAGNOSIS — R42 Dizziness and giddiness: Secondary | ICD-10-CM

## 2012-12-15 DIAGNOSIS — I10 Essential (primary) hypertension: Secondary | ICD-10-CM | POA: Insufficient documentation

## 2012-12-15 DIAGNOSIS — Z87891 Personal history of nicotine dependence: Secondary | ICD-10-CM | POA: Insufficient documentation

## 2012-12-15 DIAGNOSIS — R51 Headache: Secondary | ICD-10-CM | POA: Insufficient documentation

## 2012-12-15 DIAGNOSIS — J449 Chronic obstructive pulmonary disease, unspecified: Secondary | ICD-10-CM | POA: Insufficient documentation

## 2012-12-15 DIAGNOSIS — Z79899 Other long term (current) drug therapy: Secondary | ICD-10-CM | POA: Insufficient documentation

## 2012-12-15 DIAGNOSIS — J4489 Other specified chronic obstructive pulmonary disease: Secondary | ICD-10-CM | POA: Insufficient documentation

## 2012-12-15 DIAGNOSIS — H53149 Visual discomfort, unspecified: Secondary | ICD-10-CM | POA: Insufficient documentation

## 2012-12-15 DIAGNOSIS — I2510 Atherosclerotic heart disease of native coronary artery without angina pectoris: Secondary | ICD-10-CM | POA: Insufficient documentation

## 2012-12-15 LAB — CBC WITH DIFFERENTIAL/PLATELET
Basophils Absolute: 0.1 10*3/uL (ref 0.0–0.1)
HCT: 40 % (ref 39.0–52.0)
Hemoglobin: 14.2 g/dL (ref 13.0–17.0)
Lymphocytes Relative: 35 % (ref 12–46)
Monocytes Absolute: 0.5 10*3/uL (ref 0.1–1.0)
Monocytes Relative: 9 % (ref 3–12)
Neutro Abs: 2.5 10*3/uL (ref 1.7–7.7)
Neutrophils Relative %: 50 % (ref 43–77)
RDW: 12.9 % (ref 11.5–15.5)
WBC: 5.1 10*3/uL (ref 4.0–10.5)

## 2012-12-15 LAB — BASIC METABOLIC PANEL
CO2: 28 mEq/L (ref 19–32)
Chloride: 102 mEq/L (ref 96–112)
Creatinine, Ser: 0.99 mg/dL (ref 0.50–1.35)
Potassium: 3.8 mEq/L (ref 3.5–5.1)

## 2012-12-15 LAB — TROPONIN I: Troponin I: <0.3 ng/mL (ref <0.30)

## 2012-12-15 MED ORDER — ALBUTEROL SULFATE HFA 108 (90 BASE) MCG/ACT IN AERS: 2.0000 | INHALATION_SPRAY | RESPIRATORY_TRACT | Status: DC | PRN

## 2012-12-15 NOTE — ED Provider Notes (Signed)
History     CSN: [id number]  Arrival date & time 12/15/12  1329   None     Chief Complaint  Patient presents with  . Headache    (Consider location/radiation/quality/duration/timing/severity/associated sxs/prior treatment) Patient is a 62 y.o. male presenting with headaches. The history is provided by the patient.  Headache Pain location:  L parietal and R parietal Quality: pressure. Radiates to:  Does not radiate Severity currently:  5/10 Onset quality:  Unable to specify Duration:  1 week Timing:  Intermittent Progression:  Worsening Chronicity:  New Similar to prior headaches: yes (similar in sx to headache had 2 years ago)   Relieved by:  None tried Worsened by:  Nothing tried Ineffective treatments:  None tried Associated symptoms: dizziness   Associated symptoms: no blurred vision, no congestion, no drainage, no fever, no focal weakness, no hearing loss, no near-syncope, no numbness, no sinus pressure, no syncope and no visual change     Past Medical History  Diagnosis Date  . Hypertension   . COPD (chronic obstructive pulmonary disease)   . CAD (coronary artery disease)   . GERD (gastroesophageal reflux disease)     History reviewed. No pertinent past surgical history.  Family History  Problem Relation Age of Onset  . Heart disease Brother   . Heart disease Sister     History  Substance Use Topics  . Smoking status: Former Games developer  . Smokeless tobacco: Never Used     Comment: Quit in 1988  . Alcohol Use: No      Review of Systems  Constitutional: Negative for fever and chills.  HENT: Negative for hearing loss, congestion, postnasal drip and sinus pressure.   Eyes: Negative for blurred vision.  Cardiovascular: Negative for chest pain, palpitations, syncope and near-syncope.  Neurological: Positive for dizziness and headaches. Negative for focal weakness, weakness and numbness.    Allergies  Review of patient's allergies indicates no known  allergies.  Home Medications   Current Outpatient Rx  Name  Route  Sig  Dispense  Refill  . acetaminophen (TYLENOL) 500 MG tablet   Oral   Take 500 mg by mouth every 6 (six) hours as needed. For headache.          . albuterol (PROVENTIL HFA;VENTOLIN HFA) 108 (90 BASE) MCG/ACT inhaler   Inhalation   Inhale 2 puffs into the lungs every 4 (four) hours as needed. For shortness of breath.          . diazepam (VALIUM) 5 MG tablet   Oral   Take 1 tablet (5 mg total) by mouth every 6 (six) hours as needed (spasms).   10 tablet   0     BP 133/89  Pulse 76  Temp(Src) 99 F (37.2 C) (Oral)  Resp 20  SpO2 99%  Physical Exam  Constitutional: He is oriented to person, place, and time. He appears well-developed and well-nourished. No distress.  Cardiovascular: Normal rate and regular rhythm.   Pulmonary/Chest: Effort normal and breath sounds normal.  Neurological: He is alert and oriented to person, place, and time. He has normal strength. No cranial nerve deficit. He displays a negative Romberg sign. Coordination and gait normal.    ED Course  Procedures (including critical care time)  Labs Reviewed - No data to display No results found.   1. Headache   2. Dizziness       MDM  Headache, dizziness for a week. Hx asymmetric ventricle size on head CT from 2012.  Concern  for further changes.  Transfer to ER for further eval.        Cathlyn Parsons, NP 12/15/12 1545

## 2012-12-15 NOTE — ED Notes (Signed)
Pt tolerating PO fluids without issue. Pt ambulates independently.

## 2012-12-15 NOTE — ED Notes (Signed)
Pt comfortable with d/c instructions. Precscription x1.

## 2012-12-15 NOTE — ED Notes (Signed)
Pt c/o bilateral head pressure x 1 week; pt sent here from [facility] for further eval

## 2012-12-15 NOTE — ED Provider Notes (Signed)
Medical screening examination/treatment/procedure(s) were performed by non-physician practitioner and as supervising physician I was immediately available for consultation/collaboration.  Raynald Blend, MD 12/15/12 [phone]

## 2012-12-15 NOTE — ED Notes (Signed)
Pt   Reports  Symptoms  Of  Headache       described  As   A  Pressure        With  Lightheaded       Sensation  And  dizzyness         Pt  Reports  Symptoms   X  4  Days         Pt  Reports  He  Had   Symptoms   Similar  In past         He  Is  Awake  And  Alert  And  Oriented         Ambulated  With a  Steady  Fluid  gait

## 2012-12-15 NOTE — ED Provider Notes (Signed)
History     CSN: [id number]  Arrival date & time 12/15/12  1604   First MD Initiated Contact with Patient 12/15/12 1618      Chief Complaint  Patient presents with  . Headache    (Consider location/radiation/quality/duration/timing/severity/associated sxs/prior treatment) HPI Comments: Patient is a 61 year old male with a history of hypertension, CAD, and COPD who presents for headache x4 days. Patient states the headache came on gradually, is pressure-like, present in b/l parietal region, and waxing and waning in severity. He denies any aggravating or alleviating factors. Patient admits to associated light headiness and dizziness, primarily when going from a supine to upright position. Patient denies any syncope. Patient denies fever, visual disturbances, trouble swallowing, neck pain or stiffness, chest pain or shortness of breath, nausea, vomiting, abdominal pain, no numbness or tingling in his extremities, and weakness.  Patient is a 62 y.o. male presenting with headaches. The history is provided by the patient. No language interpreter was used.  Headache Associated symptoms: dizziness   Associated symptoms: no back pain, no fever, no nausea, no neck pain, no neck stiffness, no numbness and no vomiting     Past Medical History  Diagnosis Date  . Hypertension   . COPD (chronic obstructive pulmonary disease)   . CAD (coronary artery disease)   . GERD (gastroesophageal reflux disease)     History reviewed. No pertinent past surgical history.  Family History  Problem Relation Age of Onset  . Heart disease Brother   . Heart disease Sister     History  Substance Use Topics  . Smoking status: Former Games developer  . Smokeless tobacco: Never Used     Comment: Quit in 1988  . Alcohol Use: No     Review of Systems  Constitutional: Negative for fever and chills.  HENT: Negative for trouble swallowing, neck pain, neck stiffness and tinnitus.   Respiratory: Negative for shortness of  breath.   Cardiovascular: Negative for chest pain.  Gastrointestinal: Negative for nausea, vomiting and blood in stool.  Musculoskeletal: Negative for back pain.  Neurological: Positive for dizziness, light-headedness and headaches. Negative for syncope, weakness and numbness.  All other systems reviewed and are negative.    Allergies  Review of patient's allergies indicates no known allergies.  Home Medications   Current Outpatient Rx  Name  Route  Sig  Dispense  Refill  . acetaminophen (TYLENOL) 500 MG tablet   Oral   Take 500 mg by mouth every 6 (six) hours as needed. For headache.          . polyvinyl alcohol (LIQUIFILM TEARS) 1.4 % ophthalmic solution   Both Eyes   Place 1 drop into both eyes daily as needed. For dry eyes         . albuterol (PROVENTIL HFA;VENTOLIN HFA) 108 (90 BASE) MCG/ACT inhaler   Inhalation   Inhale 2 puffs into the lungs every 4 (four) hours as needed for wheezing.   1 Inhaler   0     BP 155/97  Pulse 62  Temp(Src) 97.6 F (36.4 C) (Oral)  Resp 18  SpO2 98%  Physical Exam  Nursing note and vitals reviewed. Constitutional: He is oriented to person, place, and time. He appears well-developed and well-nourished. No distress.  HENT:  Head: Normocephalic and atraumatic.  Mouth/Throat: Oropharynx is clear and moist. No oropharyngeal exudate.  Symmetric rise of the uvula with phonation  Eyes: Conjunctivae are normal. Pupils are equal, round, and reactive to light. No scleral icterus.  Neck:  Normal range of motion. Neck supple.  No meningeal signs  Cardiovascular: Normal rate, regular rhythm, normal heart sounds and intact distal pulses.   Distal radial, dorsalis pedis, posterior tibial pulses 2+ bilaterally.  Pulmonary/Chest: Effort normal and breath sounds normal. No respiratory distress. He has no wheezes. He has no rales.  Abdominal: Soft. Bowel sounds are normal. He exhibits no distension. There is no tenderness. There is no rebound and  no guarding.  Musculoskeletal: Normal range of motion. He exhibits no edema.  Lymphadenopathy:    He has no cervical adenopathy.  Neurological: He is alert and oriented to person, place, and time. He has normal reflexes.  Cranial nerves II through XII grossly intact. Patient is equal grip strength bilaterally and 5 out of 5 strength against resistance in his bilateral upper and lower extremities. DTRs normal and symmetric. No sensory or motor deficits appreciated.  Skin: Skin is warm and dry. No rash noted. He is not diaphoretic. No erythema.  Psychiatric: He has a normal mood and affect. His behavior is normal.    ED Course  Procedures (including critical care time)  Labs Reviewed  CBC WITH DIFFERENTIAL - Abnormal; Notable for the following:    Basophils Relative 2 (*)    All other components within normal limits  BASIC METABOLIC PANEL - Abnormal; Notable for the following:    GFR calc non Af Amer 86 (*)    All other components within normal limits  TROPONIN I  SEDIMENTATION RATE   Ct Head Wo Contrast  12/15/2012  *RADIOLOGY REPORT*  Clinical Data: Headache.  CT HEAD WITHOUT CONTRAST  Technique:  Contiguous axial images were obtained from the base of the skull through the vertex without contrast.  Comparison: CT head without contrast 12/02 post 10/1010.  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  There is inferior displacement of the choroid plexus without significant change.  Atrophy and mild white matter disease is similar to the prior exam.  No acute cortical infarct, hemorrhage, mass lesion is present.  There is no significant extra-axial fluid collection.  Chronic opacification of posterior right ethmoid air cells is again noted.  The paranasal sinuses and mastoid air cells are otherwise clear.  IMPRESSION:  1.  Stable asymmetry of the lateral ventricles.  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 2.  Stable  atrophy and white matter disease. 3.  No acute intracranial abnormality.   Original Report Authenticated By: Marin Roberts, M.D.     Date: 12/15/2012  Rate: 55  Rhythm: sinus bradycardia  QRS Axis: left  Intervals: normal  ST/T Wave abnormalities: normal  Conduction Disutrbances:early precordial R/S transition  Narrative Interpretation: NSR without STEMI or T wave inversion  Old EKG Reviewed: unchanged from 08/12/2011   1. Headache     MDM  Patient presents from urgent care for evaluation of headache x4 days. Headache without thunderclap onset and patient neurovascularly intact without sensory or motor deficits; no meningeal signs or nuchal rigidity. 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 EKG unchanged from prior and CBC, BMP, troponin, and ESR wnl. Patient states headache has improved without tylenol or advil and is requesting d/c. Will d/c with PCP follow up. Indications for ED return discussed. Patient states comfort and understanding with this d/c plan with no unaddressed concerns. Patient seen also by Dr. 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        Antony Madura, PA-C 12/18/12 1039

## 2012-12-18 NOTE — ED Provider Notes (Signed)
Medical screening examination/treatment/procedure(s) were conducted as a shared visit with non-physician practitioner(s) and myself.  I personally evaluated the patient during the encounter  Gradual onset headache. NO neuro deficits.  Sent from urgent care because of history of asymmetric ventricles.  NO meningismus, no temporal artery tenderness  Glynn Octave, MD 12/18/12 1801

## 2013-05-20 ENCOUNTER — Emergency Department (HOSPITAL_COMMUNITY)
Admission: EM | Admit: 2013-05-20 | Discharge: 2013-05-20 | Disposition: A | Discharge status: Home / Self Care | Payer: BC Managed Care – PPO | Attending: Emergency Medicine | Admitting: Emergency Medicine

## 2013-05-20 ENCOUNTER — Encounter (HOSPITAL_COMMUNITY): Payer: Self-pay | Admitting: Emergency Medicine

## 2013-05-20 DIAGNOSIS — I10 Essential (primary) hypertension: Secondary | ICD-10-CM | POA: Insufficient documentation

## 2013-05-20 DIAGNOSIS — J4489 Other specified chronic obstructive pulmonary disease: Secondary | ICD-10-CM | POA: Insufficient documentation

## 2013-05-20 DIAGNOSIS — Z8719 Personal history of other diseases of the digestive system: Secondary | ICD-10-CM | POA: Insufficient documentation

## 2013-05-20 DIAGNOSIS — J449 Chronic obstructive pulmonary disease, unspecified: Secondary | ICD-10-CM | POA: Insufficient documentation

## 2013-05-20 DIAGNOSIS — Z79899 Other long term (current) drug therapy: Secondary | ICD-10-CM | POA: Insufficient documentation

## 2013-05-20 DIAGNOSIS — R42 Dizziness and giddiness: Secondary | ICD-10-CM | POA: Insufficient documentation

## 2013-05-20 DIAGNOSIS — I2510 Atherosclerotic heart disease of native coronary artery without angina pectoris: Secondary | ICD-10-CM | POA: Insufficient documentation

## 2013-05-20 DIAGNOSIS — Z87891 Personal history of nicotine dependence: Secondary | ICD-10-CM | POA: Insufficient documentation

## 2013-05-20 LAB — BASIC METABOLIC PANEL
BUN: 6 mg/dL (ref 6–23)
Calcium: 10.2 mg/dL (ref 8.4–10.5)
Chloride: 99 mEq/L (ref 96–112)
Creatinine, Ser: 0.91 mg/dL (ref 0.50–1.35)
GFR calc Af Amer: >90 mL/min (ref >90)

## 2013-05-20 LAB — CBC
HCT: 43.1 % (ref 39.0–52.0)
MCH: 29 pg (ref 26.0–34.0)
MCV: 80.7 fL (ref 78.0–100.0)
Platelets: 215 10*3/uL (ref 150–400)
RDW: 12.6 % (ref 11.5–15.5)
WBC: 4.3 10*3/uL (ref 4.0–10.5)

## 2013-05-20 LAB — POCT I-STAT TROPONIN I: Troponin i, poc: 0.02 ng/mL (ref 0.00–0.08)

## 2013-05-20 MED ORDER — MECLIZINE HCL 25 MG PO TABS: 25.0000 mg | ORAL_TABLET | Freq: Once | ORAL | Status: DC

## 2013-05-20 MED ORDER — MECLIZINE HCL 25 MG PO TABS
25.0000 mg | ORAL_TABLET | Freq: Once | ORAL | Status: AC
  Administered 2013-05-20: 25 mg via ORAL
  Filled 2013-05-20: qty 1

## 2013-05-20 NOTE — ED Notes (Signed)
Pt reports feeling "dizzy". Pt also states that the room is spinning while standing, sitting and lying down.

## 2013-05-20 NOTE — ED Provider Notes (Signed)
CSN: [id number]     Arrival date & time 05/20/13  1454 History   First MD Initiated Contact with Patient 05/20/13 1809     Chief Complaint  Patient presents with  . Dizziness    HPI Pt reports for the last week he's been feeling dizzy. Reports room spinning sensation when standing or turning quickly. Pt reports pressure sensation in his head. Currently awake, alert, oriented x4, NAD.  Past Medical History  Diagnosis Date  . Hypertension   . COPD (chronic obstructive pulmonary disease)   . CAD (coronary artery disease)   . GERD (gastroesophageal reflux disease)    History reviewed. No pertinent past surgical history. Family History  Problem Relation Age of Onset  . Heart disease Brother   . Heart disease Sister    History  Substance Use Topics  . Smoking status: Former Games developer  . Smokeless tobacco: Never Used     Comment: Quit in 1988  . Alcohol Use: No    Review of Systems All other systems reviewed and are negative Allergies  Review of patient's allergies indicates no known allergies.  Home Medications   Current Outpatient Rx  Name  Route  Sig  Dispense  Refill  . acetaminophen (TYLENOL) 500 MG tablet   Oral   Take 500 mg by mouth every 6 (six) hours as needed. For headache.          . albuterol (PROVENTIL HFA;VENTOLIN HFA) 108 (90 BASE) MCG/ACT inhaler   Inhalation   Inhale 2 puffs into the lungs every 4 (four) hours as needed for wheezing.   1 Inhaler   0   . polyvinyl alcohol (LIQUIFILM TEARS) 1.4 % ophthalmic solution   Both Eyes   Place 1 drop into both eyes daily as needed. For dry eyes         . meclizine (ANTIVERT) 25 MG tablet   Oral   Take 1 tablet (25 mg total) by mouth once.   30 tablet   0    BP 157/91  Pulse 51  Temp(Src) 97.6 F (36.4 C) (Oral)  Resp 15  SpO2 100% Physical Exam  Nursing note and vitals reviewed. Constitutional: He is oriented to person, place, and time. He appears well-developed and well-nourished. No distress.   HENT:  Head: Normocephalic and atraumatic.  Eyes: Pupils are equal, round, and reactive to light.  Neck: Normal range of motion.  Cardiovascular: Normal rate and intact distal pulses.  Exam reveals no friction rub.   No murmur heard. Pulmonary/Chest: No respiratory distress.  Abdominal: Normal appearance. He exhibits no distension. There is no tenderness. There is no rebound.  Musculoskeletal: Normal range of motion.  Neurological: He is alert and oriented to person, place, and time. No cranial nerve deficit.  Skin: Skin is warm and dry. No rash noted.  Psychiatric: He has a normal mood and affect. His behavior is normal.    ED Course  Procedures (including critical care time)   Date: 05/20/2013  Rate: 76  Rhythm: normal sinus rhythm  QRS Axis: normal  Intervals: normal  ST/T Wave abnormalities: Nonspecific changes  Conduction Disutrbances: none  Narrative Interpretation: Abnormal EKG   Medications  meclizine (ANTIVERT) tablet 25 mg (25 mg Oral Given 05/20/13 1837)   After treatment in the ED the patient feels back to baseline and wants to go home.  Labs Review Labs Reviewed  BASIC METABOLIC PANEL - Abnormal; Notable for the following:    GFR calc non Af Amer 88 (*)  All other components within normal limits  CBC  POCT I-STAT TROPONIN I   Imaging Review No results found.  MDM   1. Vertigo        Nelia Shi, MD 05/31/13 2150

## 2013-05-20 NOTE — ED Notes (Signed)
Pt reports for the last week he's been feeling dizzy. Reports room spinning sensation when standing or turning quickly. Pt reports pressure sensation in his head. Currently awake, alert, oriented x4, NAD.

## 2013-05-21 ENCOUNTER — Telehealth (HOSPITAL_COMMUNITY): Payer: Self-pay | Admitting: Emergency Medicine

## 2013-12-07 ENCOUNTER — Emergency Department (INDEPENDENT_AMBULATORY_CARE_PROVIDER_SITE_OTHER)
Admission: EM | Admit: 2013-12-07 | Discharge: 2013-12-07 | Disposition: A | Discharge status: Nursing Home (Includes ICF) | Payer: BC Managed Care – PPO | Source: Home / Self Care | Attending: Emergency Medicine | Admitting: Emergency Medicine

## 2013-12-07 ENCOUNTER — Emergency Department (INDEPENDENT_AMBULATORY_CARE_PROVIDER_SITE_OTHER): Payer: BC Managed Care – PPO

## 2013-12-07 ENCOUNTER — Emergency Department (HOSPITAL_COMMUNITY)
Admission: EM | Admit: 2013-12-07 | Discharge: 2013-12-07 | Disposition: A | Discharge status: Home / Self Care | Payer: BC Managed Care – PPO | Attending: Emergency Medicine | Admitting: Emergency Medicine

## 2013-12-07 ENCOUNTER — Encounter (HOSPITAL_COMMUNITY): Payer: Self-pay | Admitting: Emergency Medicine

## 2013-12-07 DIAGNOSIS — J4489 Other specified chronic obstructive pulmonary disease: Secondary | ICD-10-CM | POA: Insufficient documentation

## 2013-12-07 DIAGNOSIS — R072 Precordial pain: Secondary | ICD-10-CM | POA: Insufficient documentation

## 2013-12-07 DIAGNOSIS — R079 Chest pain, unspecified: Secondary | ICD-10-CM

## 2013-12-07 DIAGNOSIS — Z87891 Personal history of nicotine dependence: Secondary | ICD-10-CM | POA: Insufficient documentation

## 2013-12-07 DIAGNOSIS — M549 Dorsalgia, unspecified: Secondary | ICD-10-CM | POA: Insufficient documentation

## 2013-12-07 DIAGNOSIS — Z8719 Personal history of other diseases of the digestive system: Secondary | ICD-10-CM | POA: Insufficient documentation

## 2013-12-07 DIAGNOSIS — I2510 Atherosclerotic heart disease of native coronary artery without angina pectoris: Secondary | ICD-10-CM | POA: Insufficient documentation

## 2013-12-07 DIAGNOSIS — J449 Chronic obstructive pulmonary disease, unspecified: Secondary | ICD-10-CM | POA: Insufficient documentation

## 2013-12-07 DIAGNOSIS — I10 Essential (primary) hypertension: Secondary | ICD-10-CM | POA: Insufficient documentation

## 2013-12-07 DIAGNOSIS — Z79899 Other long term (current) drug therapy: Secondary | ICD-10-CM | POA: Insufficient documentation

## 2013-12-07 LAB — BASIC METABOLIC PANEL
BUN: 7 mg/dL (ref 6–23)
CHLORIDE: 102 meq/L (ref 96–112)
CO2: 20 mEq/L (ref 19–32)
Calcium: 9.2 mg/dL (ref 8.4–10.5)
Creatinine, Ser: 0.85 mg/dL (ref 0.50–1.35)
GFR calc non Af Amer: >90 mL/min (ref >90)
Glucose, Bld: 95 mg/dL (ref 70–99)
POTASSIUM: 4 meq/L (ref 3.7–5.3)
SODIUM: 139 meq/L (ref 137–147)

## 2013-12-07 LAB — CBC
HEMATOCRIT: 43.7 % (ref 39.0–52.0)
Hemoglobin: 15.2 g/dL (ref 13.0–17.0)
MCH: 28.9 pg (ref 26.0–34.0)
MCHC: 34.8 g/dL (ref 30.0–36.0)
MCV: 83.1 fL (ref 78.0–100.0)
Platelets: 187 10*3/uL (ref 150–400)
RBC: 5.26 MIL/uL (ref 4.22–5.81)
RDW: 13 % (ref 11.5–15.5)
WBC: 5.6 10*3/uL (ref 4.0–10.5)

## 2013-12-07 LAB — I-STAT TROPONIN, ED
TROPONIN I, POC: 0.01 ng/mL (ref 0.00–0.08)
TROPONIN I, POC: 0.01 ng/mL (ref 0.00–0.08)

## 2013-12-07 MED ORDER — NITROGLYCERIN 0.4 MG SL SUBL: 0.4000 mg | SUBLINGUAL_TABLET | SUBLINGUAL | Status: DC | PRN

## 2013-12-07 MED ORDER — GI COCKTAIL ~~LOC~~
30.0000 mL | Freq: Once | ORAL | Status: AC
  Administered 2013-12-07: 30 mL via ORAL
  Filled 2013-12-07: qty 30

## 2013-12-07 MED ORDER — PANTOPRAZOLE SODIUM 20 MG PO TBEC: 20.0000 mg | DELAYED_RELEASE_TABLET | Freq: Every day | ORAL | Status: DC

## 2013-12-07 MED ORDER — ASPIRIN 325 MG PO TABS
325.0000 mg | ORAL_TABLET | Freq: Once | ORAL | Status: AC
  Administered 2013-12-07: 325 mg via ORAL
  Filled 2013-12-07: qty 1

## 2013-12-07 NOTE — ED Notes (Signed)
Notified phlebotomy to please draw istat troponin. RN unsuccessful

## 2013-12-07 NOTE — ED Notes (Signed)
Attempted to obtain lab, unsuccessful. Notified phlebotomy to please draw lab.

## 2013-12-07 NOTE — Discharge Instructions (Signed)
Chest Pain (Nonspecific) °It is often hard to give a specific diagnosis for the cause of chest pain. There is always a chance that your pain could be related to something serious, such as a heart attack or a blood clot in the lungs. You need to follow up with your caregiver for further evaluation. °CAUSES  °· Heartburn. °· Pneumonia or bronchitis. °· Anxiety or stress. °· Inflammation around your heart (pericarditis) or lung (pleuritis or pleurisy). °· A blood clot in the lung. °· A collapsed lung (pneumothorax). It can develop suddenly on its own (spontaneous pneumothorax) or from injury (trauma) to the chest. °· Shingles infection (herpes zoster virus). °The chest wall is composed of bones, muscles, and cartilage. Any of these can be the source of the pain. °· The bones can be bruised by injury. °· The muscles or cartilage can be strained by coughing or overwork. °· The cartilage can be affected by inflammation and become sore (costochondritis). °DIAGNOSIS  °Lab tests or other studies, such as X-rays, electrocardiography, stress testing, or cardiac imaging, may be needed to find the cause of your pain.  °TREATMENT  °· Treatment depends on what may be causing your chest pain. Treatment may include: °· Acid blockers for heartburn. °· Anti-inflammatory medicine. °· Pain medicine for inflammatory conditions. °· Antibiotics if an infection is present. °· You may be advised to change lifestyle habits. This includes stopping smoking and avoiding alcohol, caffeine, and chocolate. °· You may be advised to keep your head raised (elevated) when sleeping. This reduces the chance of acid going backward from your stomach into your esophagus. °· Most of the time, nonspecific chest pain will improve within 2 to 3 days with rest and mild pain medicine. °HOME CARE INSTRUCTIONS  °· If antibiotics were prescribed, take your antibiotics as directed. Finish them even if you start to feel better. °· For the next few days, avoid physical  activities that bring on chest pain. Continue physical activities as directed. °· Do not smoke. °· Avoid drinking alcohol. °· Only take over-the-counter or prescription medicine for pain, discomfort, or fever as directed by your caregiver. °· Follow your caregiver's suggestions for further testing if your chest pain does not go away. °· Keep any follow-up appointments you made. If you do not go to an appointment, you could develop lasting (chronic) problems with pain. If there is any problem keeping an appointment, you must call to reschedule. °SEEK MEDICAL CARE IF:  °· You think you are having problems from the medicine you are taking. Read your medicine instructions carefully. °· Your chest pain does not go away, even after treatment. °· You develop a rash with blisters on your chest. °SEEK IMMEDIATE MEDICAL CARE IF:  °· You have increased chest pain or pain that spreads to your arm, neck, jaw, back, or abdomen. °· You develop shortness of breath, an increasing cough, or you are coughing up blood. °· You have severe back or abdominal pain, feel nauseous, or vomit. °· You develop severe weakness, fainting, or chills. °· You have a fever. °THIS IS AN EMERGENCY. Do not wait to see if the pain will go away. Get medical help at once. Call your local emergency services (911 in [place] Do not drive yourself to the hospital. °MAKE SURE YOU:  °· Understand these instructions. °· Will watch your condition. °· Will get help right away if you are not doing well or get worse. °Document Released: 06/06/2005 Document Revised: 11/19/2011 Document Reviewed: 04/01/2008 °ExitCare® Patient Information ©2014 [employer],   LLC. ° ° ° °Emergency Department Resource Guide °1) Find a Doctor and Pay Out of Pocket °Although you won't have to find out who is covered by your insurance plan, it is a good idea to ask around and get recommendations. You will then need to call the office and see if the doctor you have chosen will accept you as a new  patient and what types of options they offer for patients who are self-pay. Some doctors offer discounts or will set up payment plans for their patients who do not have insurance, but you will need to ask so you aren't surprised when you get to your appointment. ° °2) Contact Your Local Health Department °Not all health departments have doctors that can see patients for sick visits, but many do, so it is worth a call to see if yours does. If you don't know where your local health department is, you can check in your phone book. The CDC also has a tool to help you locate your state's health department, and many state websites also have listings of all of their local health departments. ° °3) Find a Walk-in Clinic °If your illness is not likely to be very severe or complicated, you may want to try a walk in clinic. These are popping up all over the country in pharmacies, drugstores, and shopping centers. They're usually staffed by nurse practitioners or physician assistants that have been trained to treat common illnesses and complaints. They're usually fairly quick and inexpensive. However, if you have serious medical issues or chronic medical problems, these are probably not your best option. ° °No Primary Care Doctor: °- Call [facility] at  832-8000 - they can help you locate a primary care doctor that  accepts your insurance, provides certain services, etc. °- Physician Referral Service- 1-800-533-3463 ° °Chronic Pain Problems: °Organization         Address  Phone   Notes  °[facility]  (336) 297-2271 Patients need to be referred by their primary care doctor.  ° °Medication Assistance: °Organization         Address  Phone   Notes  °[place] [facility] [address] °[place], [place] [address] (336) 641-8030 --Must be a resident of [place] °-- Must have NO insurance coverage whatsoever (no Medicaid/ Medicare, etc.) °-- The pt. MUST have a primary  care doctor that directs their care regularly and follows them in the community °  °[facility]  (866) 331-1348   °[employer]  (888) 892-1162   ° °Agencies that provide inexpensive medical care: °Organization         Address  Phone   Notes  °[facility]  (336) 832-8035   °[facility]    (336) 832-7272   °[facility] [address] °[place], [place] [address] (336) 832-4777   °[facility] [address], °[place] (336) 271-4999   °[facility]    (336) 373-0678   °[facility]    (336) 272-1050   °[facility] ° [address], [place] Phone:  (336) 832-4444, Fax:  (336) 832-4440 Hours of Operation:  9 am - 6 pm, M-F.  Also accepts Medicaid/Medicare and self-pay.  °[facility] ° [address], [place] Phone: (336) 832-3150, Fax: (336) 832-3151. Hours of Operation:  8:30 am - 5:30 pm, M-F.  Also accepts Medicaid and self-pay.  °  [facility] [place] [address], [place] Phone: (336) 878-6027   °[facility] [address], [place], [place] (336)723-1848, Ext. 123 Mondays & Thursdays: 7-9 AM.  First 15 patients are seen on a first come, first serve basis. °  ° °Medicaid-accepting [place] Providers: ° °Organization         Address  Phone   Notes  °[facility] [address], [place] (336) 641-2100 Also accepts self-pay patients.  °[facility] [address], [place] ° (336) 856-9996   °[facility] [address], [place] (336) 288-8857   °[facility] [address] [place] [address], [place] (336) 299-7000   °Veita Bland [address], [place]  ° (336) 373-1557 Only accepts [place] Access Medicaid patients after they have their name applied to their card.  ° °Self-Pay (no insurance) in [place]: ° °Organization         Address  Phone   Notes  °Sickle Cell Patients, [facility] [address], [place] (336) 832-1970   °[facility] [address], [place] (336) 832-4400   °[facility] ° [address],  (336) 992-4800   °[facility]/Dr. Osei-Bonsu ° [address] [place] [address], [place] or [address], [place] (336) 841-8500 Phone number for both [place] and [place] locations is the same.  °[facility] [address], [place] (336) 299-0000   °[facility] [address] [place] [address], [place] or [address] (336) 852-7530 °(336) 878-2260   °[facility] [address], [place] (336) 350-1642, phone; (336) 294-5005, fax Sees patients 1st and 3rd Saturday of every month.  Must not qualify for public or private insurance (i.e. Medicaid, Medicare, [place] Health Choice, Veterans' Benefits) • Household income should be no more than 200% of the poverty level •The clinic cannot treat you if you are pregnant or think you are pregnant • Sexually transmitted diseases are not treated at the clinic.  ° ° °Dental Care: °Organization         Address  Phone  Notes  °[facility] [facility] [address], [place] (336) 641-6152 Accepts children up to age 21 who are enrolled in Medicaid or [place] Health Choice; pregnant women with a Medicaid card; and children who have applied for Medicaid or [place] Health Choice, but were declined, whose parents can pay a reduced fee at time of service.  °[facility] [place]  [address], [place] (336) 641-7733 Accepts children up to age 21 who are enrolled in Medicaid or [place] Health Choice; pregnant women with a Medicaid card; and children who have applied for Medicaid or [place] Health Choice, but were declined, whose parents can  pay a reduced fee at time of service.  °[facility] ° [address], [place] (336) 641-4533 Patients are seen by appointment only. Walk-ins are not accepted. [facility] will see patients 18 years of age and older. °Monday - Tuesday (8am-5pm) °Most Wednesdays (8:30-5pm) °$30 per visit, cash only  °[facility] ° [address], [place] (336) 641-4533 Patients are seen by appointment only. Walk-ins are not accepted. [facility] will see patients 18 years of   age and older. °One Wednesday Evening (Monthly: Volunteer Based).  $30 per visit, cash only  °[facility]  (919) 537-3737 for adults; Children under age 4, call Graduate Pediatric Dentistry at (919) [phone]. Children aged 4-14, please call (919) [phone] to request a pediatric application. ° Dental services are provided in all areas of dental care including fillings, crowns and bridges, complete and partial dentures, implants, gum treatment, root canals, and extractions. Preventive care is also provided. Treatment is provided to both adults and children. °Patients are selected via a lottery and there is often a waiting list. °  °[facility] [address], °[place] ° (336) 763-8833 [email or web address] °  °[facility] [address], [place], [place] (336)723-1848, Ext. 123 Second and Fourth Thursday of each month, opens at 6:30 AM; Clinic ends at 9 AM.  Patients are seen on a first-come first-served basis, and a limited number are seen during each clinic.  ° °[facility] ° [address], [place], [place] (336) 723-7904   Eligibility Requirements °You must have lived in [place], [place], or [place] counties for at least the last three months. °  You cannot be eligible for state or federal sponsored healthcare insurance, including Veterans Administration, Medicaid, or Medicare. °  You generally cannot be eligible for healthcare  insurance through your employer.  °  How to apply: °Eligibility screenings are held every Tuesday and Wednesday afternoon from 1:00 pm until 4:00 pm. You do not need an appointment for the interview!  °[facility] [address], [place], [place] 336-631-2330   °[facility]  336-342-8273   °[facility]  336-703-3100   °[facility]  336-570-6415   ° °Behavioral Health Resources in the Community: °Intensive Outpatient Programs °Organization         Address  Phone  Notes  °[facility] [address], [place], [place] 336-878-6098   °[facility] [address], [place], [place] 336-832-9800   °[facility] [address], [place], [place] ° 336-882-2125   °[facility] [address],  °[place], [place] 1-800-853-5163 or 336-641-4981   °Substance Abuse Resources °Organization         Address  Phone  Notes  °[facility]  336-882-2125   °[facility]  336-784-9470   °[facility]  336-285-9073   °[facility]  336-845-3988   °Residential & Outpatient Substance Abuse Program  1-800-659-3381   °Psychological Services °Organization         Address  Phone  Notes  °[facility]  336- 832-9600   °[facility]  336- 378-7881   °[facility] [address], [place] 1-800-853-5163 or 336-641-4981   ° °Mobile Crisis Teams °Organization         Address  Phone  Notes  °[facility], Mobile Crisis Care Unit  1-877-626-1772   °Assertive °[facility] ° [address] [place], [place] 336-834-9664   °Sharon DeEsch [address] °[place] 336-554-5454   ° °Self-Help/Support Groups °Organization         Address  Phone             Notes  °[facility] - variety of support groups  336- 373-1402 Call for more information  °Narcotics Anonymous (NA),  [facility] [address], °[place]  2   meetings at this location  ° °Residential Treatment Programs °Organization         Address  Phone  Notes  °[facility] [address],    °[place]  1-866-801-8205   °[facility] ° [address], [place], [place] 704-293-8524   °[facility] [address], [place] 336-845-3988 Admissions: 8am-3pm M-F  °[facility] [address]    °[place], [place] 336-841-1104   °[facility] [address], [place], [place] 336-379-7146   °[facility] [address]  °[place], [place] 336-285-9073   °[facility] - Intensive Outpatient [address], [place], [place] 336-852-3033   °[facility] [address]  °[place], [place] 1-877-615-2722 or 336-784-9470   °[facility] [address] [place], [place] 336-227-7417 Accepts Medicaid  °[facility] [address]  ° [place] 1-800-659-3381 Substance Abuse/Addiction Treatment  ° °[place] Behavioral Health Resources °Organization         Address  Phone  Notes  °[facility]  (888) 581-9988   °Julie Brannon, PhD [address] [place], [place]   (336) 349-5553 or (336) 951-0000   °[facility]   [address] °[place], [place] (336) 349-4454   °[facility] [address], [place], [place] (336) 342-8316 Insurance/Medicaid/sponsorship through [facility]  °[facility] [address]                                    [place], [place] (336) 342-8316 Therapy/tele-psych/case  °[facility] [address]  ° [place], [place] (336) 349-2233    °Dr. Arfeen  (336) 349-4544   °[facility] [place]  [employer] [place] [facility] 1) [address], [place] °2) [address], [place] °3)  [address], [place] (336) 349-3220 °(336) 342-7768 ° °(336) 342-8140    °[place] [facility] (336) 342-1394 or (336) 342-3537 (After Hours)    ° ° ° °

## 2013-12-07 NOTE — ED Provider Notes (Signed)
Medical screening examination/treatment/procedure(s) were performed by non-physician practitioner and as supervising physician I was immediately available for consultation/collaboration.  Philipp Deputy, M.D.  Harden Mo, MD 12/07/13 2223

## 2013-12-07 NOTE — ED Notes (Signed)
Gave pt turkey sandwich and water 

## 2013-12-07 NOTE — ED Notes (Signed)
C/o  Burning sensation through out chest.  Pain is worse with lying.   Pt has tried acid reflux meds otc with no relief.   Denies any other symptoms.

## 2013-12-07 NOTE — ED Notes (Addendum)
Per pt sts for about a week he has been having chest pain, back pain, neck pain and bilateral shoulder pain. Describes the pain as a soreness. sts the pain is worse when he lays down. Denies SOB. Pt sent here from [facility] for cardiac work up.

## 2013-12-07 NOTE — ED Provider Notes (Signed)
CSN: [id number]     Arrival date & time 12/07/13  1453 History   First MD Initiated Contact with Patient 12/07/13 1528     Chief Complaint  Patient presents with  . Chest Pain  . Back Pain     (Consider location/radiation/quality/duration/timing/severity/associated sxs/prior Treatment) Patient is a 63 y.o. male presenting with chest pain and back pain.  Chest Pain Pain location:  Substernal area Pain quality: burning   Pain radiates to:  Does not radiate Pain radiates to the back: no   Pain severity:  Moderate Onset quality:  Gradual Timing:  Intermittent Progression:  Unchanged Chronicity:  New Context: at rest   Relieved by: getting up, moving around. Exacerbated by: lying down. Associated symptoms: back pain   Associated symptoms: no abdominal pain, no cough, no fever, no nausea, no shortness of breath and not vomiting   Back Pain Associated symptoms: chest pain   Associated symptoms: no abdominal pain and no fever     Past Medical History  Diagnosis Date  . Hypertension   . COPD (chronic obstructive pulmonary disease)   . CAD (coronary artery disease)   . GERD (gastroesophageal reflux disease)    History reviewed. No pertinent past surgical history. Family History  Problem Relation Age of Onset  . Heart disease Brother   . Heart disease Sister    History  Substance Use Topics  . Smoking status: Former Research scientist (life sciences)  . Smokeless tobacco: Never Used     Comment: Quit in 1988  . Alcohol Use: No    Review of Systems  Constitutional: Negative for fever and chills.  Respiratory: Negative for cough and shortness of breath.   Cardiovascular: Positive for chest pain. Negative for leg swelling.  Gastrointestinal: Negative for nausea, vomiting and abdominal pain.  Musculoskeletal: Positive for back pain.  All other systems reviewed and are negative.      Allergies  Review of patient's allergies indicates no known allergies.  Home Medications   Current  Outpatient Rx  Name  Route  Sig  Dispense  Refill  . acetaminophen (TYLENOL) 500 MG tablet   Oral   Take 500 mg by mouth every 6 (six) hours as needed. For headache.          . albuterol (PROVENTIL HFA;VENTOLIN HFA) 108 (90 BASE) MCG/ACT inhaler   Inhalation   Inhale 2 puffs into the lungs every 4 (four) hours as needed for wheezing.   1 Inhaler   0   . meclizine (ANTIVERT) 25 MG tablet   Oral   Take 1 tablet (25 mg total) by mouth once.   30 tablet   0   . polyvinyl alcohol (LIQUIFILM TEARS) 1.4 % ophthalmic solution   Both Eyes   Place 1 drop into both eyes daily as needed. For dry eyes          BP 173/98  Resp 18  SpO2 98% Physical Exam  Nursing note and vitals reviewed. Constitutional: He is oriented to person, place, and time. He appears well-developed and well-nourished. No distress.  HENT:  Head: Normocephalic and atraumatic.  Mouth/Throat: No oropharyngeal exudate.  Eyes: EOM are normal. Pupils are equal, round, and reactive to light.  Neck: Normal range of motion. Neck supple.  Cardiovascular: Normal rate and regular rhythm.  Exam reveals no friction rub.   No murmur heard. Pulmonary/Chest: Effort normal and breath sounds normal. No respiratory distress. He has no wheezes. He has no rales. He exhibits no tenderness.  Abdominal: He exhibits no distension.  There is no tenderness. There is no rebound.  Musculoskeletal: Normal range of motion. He exhibits no edema.  Neurological: He is alert and oriented to person, place, and time.  Skin: No rash noted. He is not diaphoretic.    ED Course  Procedures (including critical care time) Labs Review Labs Reviewed  CBC  BASIC METABOLIC PANEL  Randolm Idol, ED   Imaging Review Dg Chest 2 View  12/07/2013   CLINICAL DATA:  Chest pain.  Burning sensation throughout the chest.  EXAM: CHEST  2 VIEW  COMPARISON:  PA and lateral chest 08/12/2011.  FINDINGS: The lungs are clear. Heart size is normal. No pneumothorax  or pleural effusion.  IMPRESSION: Negative chest.   Electronically Signed   By: Inge Rise M.D.   On: 12/07/2013 14:11     EKG Interpretation   Date/Time:  Monday December 07 2013 15:03:48 EDT Ventricular Rate:  63 PR Interval:  154 QRS Duration: 100 QT Interval:  416 QTC Calculation: 425 R Axis:   -44 Text Interpretation:  Normal sinus rhythm Left axis deviation Moderate  voltage criteria for LVH, may be normal variant Nonspecific T wave  abnormality Abnormal ECG Similar to prior Confirmed by Mingo Amber  MD, [place]  ([id number]) on 12/07/2013 3:28:55 PM      MDM   Final diagnoses:  Chest pain    [name] presents from Urgent Care for concerns of ischemic chest pain. He told them 2 weeks of CP, he told me this began Saturday, 2 days ago. No SOB, no N/V/D. He has hx of GERD, but this was worse and he was unable to control with his GERD medicine. He has hx of HTN, CAD, GERD, COPD.  Pain described as burning, worse with moving around. It is improving today though. Worse this morning when he woke up, worse with doing things. No tenderness to palpation, no exacerbation with using arms/chest muscles. EKG with some ST elevation anteriorly, seen on prior, no reciprocal depression. Will start with labs. CXR normal. Pain relived after GI cocktail and remained pain free. 2nd troponin negative. Clinical picture c/w GERD as it was described as burning. Will give resource guide and instruct f/u w/ PCP.  Osvaldo Shipper, MD 12/07/13 [phone]

## 2013-12-07 NOTE — ED Provider Notes (Signed)
CSN: [id number]     Arrival date & time 12/07/13  1141 History   First MD Initiated Contact with Patient 12/07/13 1255     Chief Complaint  Patient presents with  . Chest Pain    burning sensation through out chest   (Consider location/radiation/quality/duration/timing/severity/associated sxs/prior Treatment) Patient is a 63 y.o. male presenting with chest pain. The history is provided by the patient. No language interpreter was used.  Chest Pain Pain location:  Substernal area Pain quality: aching   Pain radiates to:  L jaw, R jaw, L arm, R arm and upper back Pain radiates to the back: yes   Pain severity:  Moderate Onset quality:  Gradual Duration:  2 weeks Timing:  Constant Progression:  Worsening Chronicity:  New Context: breathing   Worsened by:  Nothing tried Ineffective treatments:  None tried Associated symptoms: no abdominal pain   Pt has chest pain that moves around. Pt reports sometimes radiates to arms.   Aching up to neck  Past Medical History  Diagnosis Date  . Hypertension   . COPD (chronic obstructive pulmonary disease)   . CAD (coronary artery disease)   . GERD (gastroesophageal reflux disease)    History reviewed. No pertinent past surgical history. Family History  Problem Relation Age of Onset  . Heart disease Brother   . Heart disease Sister    History  Substance Use Topics  . Smoking status: Former Research scientist (life sciences)  . Smokeless tobacco: Never Used     Comment: Quit in 1988  . Alcohol Use: No    Review of Systems  Cardiovascular: Positive for chest pain.  Gastrointestinal: Negative for abdominal pain.  All other systems reviewed and are negative.    Allergies  Review of patient's allergies indicates no known allergies.  Home Medications   Current Outpatient Rx  Name  Route  Sig  Dispense  Refill  . acetaminophen (TYLENOL) 500 MG tablet   Oral   Take 500 mg by mouth every 6 (six) hours as needed. For headache.          . albuterol  (PROVENTIL HFA;VENTOLIN HFA) 108 (90 BASE) MCG/ACT inhaler   Inhalation   Inhale 2 puffs into the lungs every 4 (four) hours as needed for wheezing.   1 Inhaler   0   . meclizine (ANTIVERT) 25 MG tablet   Oral   Take 1 tablet (25 mg total) by mouth once.   30 tablet   0   . polyvinyl alcohol (LIQUIFILM TEARS) 1.4 % ophthalmic solution   Both Eyes   Place 1 drop into both eyes daily as needed. For dry eyes          BP 149/94  Pulse 71  Temp(Src) 98.3 F (36.8 C) (Oral)  Resp 16  SpO2 98% Physical Exam  Nursing note and vitals reviewed. Constitutional: He is oriented to person, place, and time. He appears well-developed and well-nourished.  HENT:  Head: Normocephalic.  Right Ear: External ear normal.  Left Ear: External ear normal.  Eyes: EOM are normal.  Neck: Normal range of motion.  Cardiovascular: Normal rate and normal heart sounds.   Pulmonary/Chest: Effort normal and breath sounds normal.  Abdominal: Soft. Bowel sounds are normal. He exhibits no distension.  Musculoskeletal: Normal range of motion.  Neurological: He is alert and oriented to person, place, and time.  Skin: Skin is warm.  Psychiatric: He has a normal mood and affect.    ED Course  Procedures (including critical care time) Labs  Review Labs Reviewed - No data to display Imaging Review No results found. Chest xray normal.   EKG  Normal sinus no acute abnormality  MDM   1. Chest pain    Pt does not have primary MD.   I am concerned about chest pain being cardiac  No Acute.   Pt to ED for troponin    Parkin, PA-C 12/07/13 [place], [place] 12/07/13 [phone]

## 2013-12-07 NOTE — ED Notes (Signed)
Pt reports chest pain is present with movement, denies CP at this time.

## 2014-01-23 ENCOUNTER — Emergency Department (HOSPITAL_COMMUNITY): Payer: BC Managed Care – PPO

## 2014-01-23 ENCOUNTER — Encounter (HOSPITAL_COMMUNITY): Payer: Self-pay | Admitting: Emergency Medicine

## 2014-01-23 ENCOUNTER — Emergency Department (HOSPITAL_COMMUNITY)
Admission: EM | Admit: 2014-01-23 | Discharge: 2014-01-23 | Disposition: A | Discharge status: Home / Self Care | Payer: BC Managed Care – PPO | Attending: Emergency Medicine | Admitting: Emergency Medicine

## 2014-01-23 DIAGNOSIS — I10 Essential (primary) hypertension: Secondary | ICD-10-CM | POA: Insufficient documentation

## 2014-01-23 DIAGNOSIS — K219 Gastro-esophageal reflux disease without esophagitis: Secondary | ICD-10-CM | POA: Insufficient documentation

## 2014-01-23 DIAGNOSIS — M549 Dorsalgia, unspecified: Secondary | ICD-10-CM | POA: Insufficient documentation

## 2014-01-23 DIAGNOSIS — M542 Cervicalgia: Secondary | ICD-10-CM

## 2014-01-23 DIAGNOSIS — Z79899 Other long term (current) drug therapy: Secondary | ICD-10-CM | POA: Insufficient documentation

## 2014-01-23 DIAGNOSIS — Z87891 Personal history of nicotine dependence: Secondary | ICD-10-CM | POA: Insufficient documentation

## 2014-01-23 DIAGNOSIS — M25519 Pain in unspecified shoulder: Secondary | ICD-10-CM | POA: Insufficient documentation

## 2014-01-23 DIAGNOSIS — I2510 Atherosclerotic heart disease of native coronary artery without angina pectoris: Secondary | ICD-10-CM | POA: Insufficient documentation

## 2014-01-23 DIAGNOSIS — J4489 Other specified chronic obstructive pulmonary disease: Secondary | ICD-10-CM | POA: Insufficient documentation

## 2014-01-23 DIAGNOSIS — J449 Chronic obstructive pulmonary disease, unspecified: Secondary | ICD-10-CM | POA: Insufficient documentation

## 2014-01-23 MED ORDER — TRAMADOL HCL 50 MG PO TABS: 50.0000 mg | ORAL_TABLET | Freq: Four times a day (QID) | ORAL | Status: DC | PRN

## 2014-01-23 MED ORDER — PREDNISONE 20 MG PO TABS: ORAL_TABLET | ORAL | Status: DC

## 2014-01-23 MED ORDER — NAPROXEN 500 MG PO TABS: 500.0000 mg | ORAL_TABLET | Freq: Two times a day (BID) | ORAL | Status: DC

## 2014-01-23 MED ORDER — METHOCARBAMOL 500 MG PO TABS: 500.0000 mg | ORAL_TABLET | Freq: Three times a day (TID) | ORAL | Status: DC

## 2014-01-23 NOTE — ED Provider Notes (Signed)
CSN: [id number]     Arrival date & time 01/23/14  1626 History  This chart was scribed for non-physician practitioner, Alecia Lemming, PA-C working with Carmin Muskrat, MD by Einar Pheasant, ED scribe. This patient was seen in room WTR7/WTR7 and the patient's care was started at 5:04 PM.    Chief Complaint  Patient presents with  . Shoulder Pain  . Neck Pain   The history is provided by the patient. No language interpreter was used.   HPI Comments: [name] is a 63 y.o. male who presents to the Emergency Department complaining of gradual onset worsening neck pain that started approximately 2 months ago. Pt is also complaining of associated bilateral shoulder pain. Mr. Biedermann states that the pain is worse at night. He reports taking Tylenol with intermittent relief. Pt states that he was seen in the ED for similar symptoms, but does not recall what he was prescribed upon discharge pt states that sometimes the pain radiates down into his arms. He describes the pain as a "soreness" and he states that sometimes when he turn his neck he can hear his neck popping. He denies any paraesthesia.  Pt does not have a PCP.  Past Medical History  Diagnosis Date  . Hypertension   . COPD (chronic obstructive pulmonary disease)   . CAD (coronary artery disease)   . GERD (gastroesophageal reflux disease)    No past surgical history on file. Family History  Problem Relation Age of Onset  . Heart disease Brother   . Heart disease Sister    History  Substance Use Topics  . Smoking status: Former Research scientist (life sciences)  . Smokeless tobacco: Never Used     Comment: Quit in 1988  . Alcohol Use: No    Review of Systems  Constitutional: Negative for fever and unexpected weight change.  Gastrointestinal: Negative for constipation.       Neg for fecal incontinence  Genitourinary: Negative for hematuria, flank pain and difficulty urinating.       Negative for urinary incontinence or retention  Musculoskeletal:  Positive for arthralgias (bilateral shoulder pain), back pain and neck pain.  Neurological: Negative for weakness and numbness.       Negative for saddle paresthesias    Allergies  Review of patient's allergies indicates no known allergies.  Home Medications   Prior to Admission medications   Medication Sig Start Date End Date Taking? Authorizing Provider  acetaminophen (TYLENOL) 500 MG tablet Take 500 mg by mouth every 6 (six) hours as needed. For headache.     Historical Provider, MD  pantoprazole (PROTONIX) 20 MG tablet Take 1 tablet (20 mg total) by mouth daily. 12/07/13   Osvaldo Shipper, MD   BP 120/82  Pulse 88  Temp(Src) 98.2 F (36.8 C) (Oral)  Resp 18  SpO2 100%  Physical Exam  Nursing note and vitals reviewed. Constitutional: He appears well-developed and well-nourished.  HENT:  Head: Normocephalic and atraumatic.  Eyes: Conjunctivae are normal.  Neck: Normal range of motion.  Cardiovascular: Normal rate and regular rhythm.   Pulses:      Radial pulses are 2+ on the right side, and 2+ on the left side.  Pulmonary/Chest: Effort normal.  Abdominal: Soft. He exhibits no distension. There is no tenderness. There is no CVA tenderness.  Musculoskeletal: Normal range of motion.       Right shoulder: He exhibits tenderness (generalized). He exhibits normal range of motion and no bony tenderness.       Left shoulder:  He exhibits tenderness (generalized). He exhibits normal range of motion and no bony tenderness.       Cervical back: He exhibits tenderness. He exhibits normal range of motion and no bony tenderness.       Thoracic back: Normal. He exhibits normal range of motion, no tenderness and no bony tenderness.  No step-off noted with palpation of spine.   Neurological: He is alert. He has normal reflexes. No sensory deficit. He exhibits normal muscle tone.  5/5 strength in entire upper and lower extremities bilaterally. No sensation deficit.   Skin: Skin is warm  and dry.  Psychiatric: He has a normal mood and affect.    ED Course  Procedures (including critical care time)  DIAGNOSTIC STUDIES: Oxygen Saturation is 100% on RA, normal by my interpretation.    COORDINATION OF CARE: 5:10 PM- will order X-Rays of neck and shoulders. Pt advised of plan for treatment and pt agrees.  Labs Review Labs Reviewed - No data to display  Imaging Review Dg Cervical Spine Complete  01/23/2014   CLINICAL DATA:  Chronic neck pain without history of injury  EXAM: CERVICAL SPINE  4+ VIEWS  COMPARISON:  None.  FINDINGS: The C2 through C4 vertebral bodies are preserved in height. There is mild loss of height of the bodies of C5, C6, and C7. Moderate-to-severe degenerative disc change is present at these levels. Prominent anterior endplate osteophytes at C5-6 and at C6-7 are present. There is mild anterolisthesis of C4 with respect to C5 amounting to approximately 3 mm. There is mild facet joint hypertrophy in the mid cervical spine bilaterally. Mild encroachment upon the neural foramina is seen bilaterally due to uncovertebral joint osteophytes. The spinous processes are intact. The odontoid appears intact and the lateral masses of C1 align normally with those of C2. The observed portions of the first and second ribs appear normal.  IMPRESSION: There is moderate degenerative disc and facet and uncovertebral joint change centered at C5-6 and at C6-7 with milder changes at C7-T1. There is encroachment upon the neural foramina bilaterally by bony osteophytes. Given the patient's chronic symptoms, elective outpatient follow-up cervical spine MRI may be useful.   Electronically Signed   By: David  Martinique   On: 01/23/2014 17:52     EKG Interpretation None      Patient seen and examined. X-ray ordered.   Vital signs reviewed and are as follows: Filed Vitals:   01/23/14 1835  BP: 140/95  Pulse: 66  Temp: 98 F (36.7 C)  Resp:     7:05 PM Pt informed of x-ray results.  Discussed that he will need to find a primary care physician to have his pain further evaluated and at the current treatment is likely only a temporary measure to improve his pain. Referrals given.  No red flag s/s of low back pain. Patient was counseled on back pain precautions and told to do activity as tolerated but do not lift, push, or pull heavy objects more than 10 pounds for the next week.  Patient counseled to use ice or heat on back for no longer than 15 minutes every hour.   Patient prescribed muscle relaxer and counseled on proper use of muscle relaxant medication.    Patient prescribed narcotic pain medicine and counseled on proper use of narcotic pain medications. Counseled not to combine this medication with others containing tylenol.   Urged patient not to drink alcohol, drive, or perform any other activities that requires focus while taking either of  these medications.  Patient urged to follow-up with PCP if pain does not improve with treatment and rest or if pain becomes recurrent. Urged to return with worsening severe pain, loss of bowel or bladder control, trouble walking.   The patient verbalizes understanding and agrees with the plan.  MDM   Final diagnoses:  Neck pain   Patient with bilateral neck and shoulder pain x1 month. X-ray show significant arthritis. Suspect this is likely related to arthritis and/or cervical radiculopathy. No neurological deficits on exam. Patient is ambulatory. No warning symptoms of back pain including: loss of bowel or bladder control, night sweats, waking from sleep with back pain, unexplained fevers or weight loss, h/o cancer, IVDU, recent trauma. No concern for cauda equina, epidural abscess, or other serious cause of back pain. Conservative measures such as rest, ice/heat and pain medicine indicated with PCP follow-up if no improvement with conservative management.   I personally performed the services described in this documentation, which  was scribed in my presence. The recorded information has been reviewed and is accurate.    Carlisle Cater, PA-C 01/23/14 1907

## 2014-01-23 NOTE — Discharge Instructions (Signed)
Please read and follow all provided instructions.  Your diagnoses today include:  1. Neck pain    Tests performed today include:  Vital signs - see below for your results today  X-rays of your neck - showing significant arthritis, you may need an MRI x-ray of your neck as an outpatient. Your primary doctor can help decide if you need this.   Medications prescribed:   Tramadol - narcotic-like pain medication  DO NOT drive or perform any activities that require you to be awake and alert because this medicine can make you drowsy.    Naproxen - anti-inflammatory pain medication  Do not exceed 500mg  naproxen every 12 hours, take with food  You have been prescribed an anti-inflammatory medication or NSAID. Take with food. Take smallest effective dose for the shortest duration needed for your pain. Stop taking if you experience stomach pain or vomiting.    Prednisone - steroid medicine   It is best to take this medication in the morning to prevent sleeping problems. If you are diabetic, monitor your blood sugar closely and stop taking Prednisone if blood sugar is over 300. Take with food to prevent stomach upset.    Robaxin (methocarbamol) - muscle relaxer medication  DO NOT drive or perform any activities that require you to be awake and alert because this medicine can make you drowsy.   Take any prescribed medications only as directed.  Home care instructions:   Follow any educational materials contained in this packet  Please rest, use ice or heat on your back for the next several days  Do not lift, push, pull anything more than 10 pounds for the next week  Follow-up instructions: Please follow-up with your primary care provider in the next 1 week for further evaluation of your symptoms. If you do not have a primary care doctor -- see below for referral information.   Return instructions:  SEEK IMMEDIATE MEDICAL ATTENTION IF YOU HAVE:  New numbness, tingling, weakness,  or problem with the use of your arms or legs  Severe back pain not relieved with medications  Loss control of your bowels or bladder  Increasing pain in any areas of the body (such as chest or abdominal pain)  Shortness of breath, dizziness, or fainting.   Worsening nausea (feeling sick to your stomach), vomiting, fever, or sweats  Any other emergent concerns regarding your health   Additional Information:  Your vital signs today were: BP 120/82   Pulse 88   Temp(Src) 98.2 F (36.8 C) (Oral)   Resp 18   SpO2 100% If your blood pressure (BP) was elevated above 135/85 this visit, please have this repeated by your doctor within one month. --------------  Emergency Department Resource Guide 1) Find a Doctor and Pay Out of Pocket Although you won't have to find out who is covered by your insurance plan, it is a good idea to ask around and get recommendations. You will then need to call the office and see if the doctor you have chosen will accept you as a new patient and what types of options they offer for patients who are self-pay. Some doctors offer discounts or will set up payment plans for their patients who do not have insurance, but you will need to ask so you aren't surprised when you get to your appointment.  2) Contact Your Local Health Department Not all health departments have doctors that can see patients for sick visits, but many do, so it is worth a call  to see if yours does. If you don't know where your local health department is, you can check in your phone book. The CDC also has a tool to help you locate your state's health department, and many state websites also have listings of all of their local health departments.  3) Find a [place] [facility] If your illness is not likely to be very severe or complicated, you may want to try a walk in clinic. These are popping up all over the country in pharmacies, drugstores, and shopping centers. They're usually staffed by nurse  practitioners or physician assistants that have been trained to treat common illnesses and complaints. They're usually fairly quick and inexpensive. However, if you have serious medical issues or chronic medical problems, these are probably not your best option.  No Primary Care Doctor: - Call Health Connect at  [phone] - they can help you locate a primary care doctor that  accepts your insurance, provides certain services, etc. - Physician Referral Service- [phone]  Chronic Pain Problems: Organization         Address  Phone   Notes  [facility]  [phone] Patients need to be referred by their primary care doctor.   Medication Assistance: Organization         Address  Phone   Notes  [facility] Medication [facility] [place] [place], [place] [address] [phone] --Must be a resident of [place] -- Must have NO insurance coverage whatsoever (no Medicaid/ Medicare, etc.) -- The pt. MUST have a primary care doctor that directs their care regularly and follows them in the community   Med[facility]ist  [phone]   [employer]  [phone]    Agencies that provide inexpensive medical care: Organization         Address  Phone   Notes  [place]  [phone]   [facility]    [phone]   [facility] [place], [place] [address] [phone]   [place] [address] [place] [phone]   Planned Parenthood    [phone]   [facility]    [phone]   [place] and [address], [place] Phone:  [phone], Fax:  [phone] Hours of Operation:  9 am - 6 pm, M-F.  Also accepts Medicaid/Medicare and self-pay.  [facility] for [place] [address], [address], [place] Phone: [phone], Fax: [phone]. Hours of Operation:  8:30 am -  5:30 pm, M-F.  Also accepts Medicaid and self-pay.  [facility] [place] [address], [place] Phone: [phone]   [place], [place], [place] [phone], Ext. [phone] Mondays & Thursdays: 7-9 AM.  First 15 patients are seen on a first come, first serve basis.    [facility] Providers:  Organization         Address  Phone   Notes  [facility] [address], [place] [phone] Also accepts self-pay patients.  [place], [place]  [phone]   [place], [address], [place] [phone]   [place] [address] [place] [phone]   Lucianne Lei [address], [place]   [phone] Only accepts [place] Access Medicaid patients after they have their name applied to their card.   Self-Pay (no insurance) in [facility]:  Organization         Address  Phone   Notes  Sickle Cell Patients, [facility] [place] [phone]   [facility] [place] [phone]   [facility] [place]  [place], [place], [place] [phone]   [facility]/Dr. Osei-Bonsu  [address] [place] or [address], [place] [phone] Phone number for both [place] and [place] locations is the same.  Urgent Medical and [facility] [address], [place] [phone]   [facility] [address] [place] or [address] [phone]   [facility] [address] [place] [phone], phone; [phone], fax Sees patients 1st and 3rd Saturday of every month.  Must not qualify for public or private insurance (i.e. Medicaid, Medicare, [place] Health Choice,  Veterans' Benefits)  Household income should be no more than 200% of the poverty level The clinic cannot treat you if you are pregnant or think you are pregnant  Sexually transmitted diseases are not treated at the clinic.    Dental Care: Organization         Address  Phone  Notes  [facility] Department of [facility] [place] [phone] Accepts children up to age 3 who are enrolled in [place] or [place]; pregnant women with a Medicaid card; and children who have applied for Medicaid or [place] Health Choice, but were declined, whose parents can pay a reduced fee at time of service.  [facility]  [address], [place] [phone] Accepts children up to age 7 who are enrolled in [place] or [place]; pregnant women with a Medicaid card; and children who have applied for Medicaid or [place] Health Choice, but were declined, whose parents can pay a reduced fee at time of service.  [facility] Adult [facility] Access PROGRAM  [place] [phone] Patients are seen by appointment only. Walk-ins are not accepted. [place] will see patients 43 years of age and older. Monday - Tuesday (8am-5pm) Most Wednesdays (8:30-5pm) $30 per visit, cash only  [facility] Adult Dental Access PROGRAM  [address], [facility] [phone] Patients are seen by appointment only. Walk-ins are not accepted. [place] will see patients 82 years of age and older. One Wednesday Evening (Monthly: Volunteer Based).  $30 per visit, cash only  [place]  [phone] for adults; Children under age 70, call Graduate Pediatric Dentistry at [phone]. Children aged 3-14, please call [phone] to request a pediatric application.  Dental services are provided in all areas of dental care including fillings, crowns and bridges, complete and  partial dentures, implants, gum treatment, root canals, and extractions. Preventive care is also provided. Treatment is provided to both adults and children. Patients are selected via a lottery and there is often a waiting list.   [facility] [address], [place]  [phone] [email or web address], [place], [place] [phone], Ext. [phone] Second and Fourth Thursday of each month, opens at 6:30  AM; Clinic ends at 9 AM.  Patients are seen on a first-come first-served basis, and a limited number are seen during each clinic.   [facility]  [address] [place] Danker [place], [place] [phone]   Eligibility Requirements You must have lived in [place], [place], or [place] counties for at least the last three months.   You cannot be eligible for state or federal sponsored [employer], including [employer], [place], or [employer].   You generally cannot be eligible for healthcare insurance through your employer.    How to apply: Eligibility screenings are held every Tuesday and Wednesday afternoon from 1:00 pm until 4:00 pm. You do not need an appointment for the interview!  [facility] [address] [place], [place]   [place]  [place] Department  [place]  [phone]    Behavioral Health Resources in the Community: Intensive Outpatient Programs Organization         Address  Phone  Notes  [place] Yale. [address], [place], [place] [phone]   [facility] Outpatient [address], [place], [place]   ADS: Alcohol & Drug Svcs [address] [place], [place]   [place] [address],  [place], [place] or [phone]   Substance Abuse Resources Organization          Address  Phone  Notes  Alcohol and Drug Services  [phone]   [place]  [phone]   [facility] [place]   Chinita Pester  [phone]   Residential & Outpatient Substance Abuse Program  [phone]   Psychological Services Organization         Address  Phone  Notes  [facility] [place]  [place]  [phone]   [place] [address] [place] or [phone]    Mobile Crisis Teams Organization         Address  Phone  Notes  [facility], Mobile Crisis Care Unit  [phone]   [facility] Services  [address] [place], [place]   [place] [address] [place] [phone]    Self-Help/Support Groups Organization         Address  Phone             Notes  [place]. of [place] - variety of support groups  Zion Call for more information  Narcotics Anonymous (NA), [facility]aring [facility] [address], [employer] [place]  2 meetings at this location   Special educational needs teacher         Address  Phone  Notes  [facility] [place],    [place]  [phone]   [facility]  [address], [place] [phone], [place], [place]   [place], [place] [phone] Admissions: 8am-3pm M-F  Incentives Substance [place] [phone],    [place], [place] [phone]   [facility] [address] Jadene Pierini [place], [place]   [facility] [address]  [place], [place]   [facility] Programs - Intensive Outpatient [address] Kristeen [address], [place], [place]   [facility] ([place] [address] [place], [place] or [phone]  [facility]idential Treatment Services (RTS) [address] [place], [place] Accepts Medicaid  [place] [address]  [place] [phone] Substance Abuse/Addiction Treatment   [facility] Organization         Address  Phone  Notes  [facility]  [phone]   Domenic Schwab, PhD [address] [place] [address] Arlis Porta [place], [place]   [phone] or [phone]   [employer] [facility] [place] [place], [place] [phone]   [address], [place], [place] [phone] Insurance/Medicaid/sponsorship through [facility] and Families [address]                                    [place], [place] [phone] [place] [address], [place] [phone]    Dr. Adele Schilder  [phone]   [facility] Dept. 1) [address] [place] 2) [place] 3)  [address], [place] [phone]  [phone]   [place] [phone] or [phone] (After Hours)

## 2014-01-23 NOTE — ED Notes (Addendum)
Patient is from home. Patient c/o pain in neck and shoulders bilaterally that started 2 months ago. Patient states the pain is getting worst. Patient states the pain is worst at night and been taking tylenol with no relief.

## 2014-01-23 NOTE — ED Provider Notes (Signed)
  Medical screening examination/treatment/procedure(s) were performed by non-physician practitioner and as supervising physician I was immediately available for consultation/collaboration.   EKG Interpretation None         Carmin Muskrat, MD 01/23/14 2358

## 2014-08-31 ENCOUNTER — Encounter (HOSPITAL_COMMUNITY): Payer: Self-pay | Admitting: Emergency Medicine

## 2014-08-31 ENCOUNTER — Emergency Department (HOSPITAL_COMMUNITY): Payer: BC Managed Care – PPO

## 2014-08-31 ENCOUNTER — Emergency Department (HOSPITAL_COMMUNITY)
Admission: EM | Admit: 2014-08-31 | Discharge: 2014-08-31 | Disposition: A | Discharge status: Home / Self Care | Payer: BC Managed Care – PPO | Attending: Emergency Medicine | Admitting: Emergency Medicine

## 2014-08-31 DIAGNOSIS — R0989 Other specified symptoms and signs involving the circulatory and respiratory systems: Secondary | ICD-10-CM

## 2014-08-31 DIAGNOSIS — K219 Gastro-esophageal reflux disease without esophagitis: Secondary | ICD-10-CM | POA: Insufficient documentation

## 2014-08-31 DIAGNOSIS — I10 Essential (primary) hypertension: Secondary | ICD-10-CM | POA: Diagnosis not present

## 2014-08-31 DIAGNOSIS — I2510 Atherosclerotic heart disease of native coronary artery without angina pectoris: Secondary | ICD-10-CM | POA: Insufficient documentation

## 2014-08-31 DIAGNOSIS — J441 Chronic obstructive pulmonary disease with (acute) exacerbation: Secondary | ICD-10-CM | POA: Diagnosis not present

## 2014-08-31 DIAGNOSIS — Z87891 Personal history of nicotine dependence: Secondary | ICD-10-CM | POA: Insufficient documentation

## 2014-08-31 DIAGNOSIS — R079 Chest pain, unspecified: Secondary | ICD-10-CM | POA: Diagnosis present

## 2014-08-31 LAB — BASIC METABOLIC PANEL
Anion gap: 3 — ABNORMAL LOW (ref 5–15)
BUN: <5 mg/dL — ABNORMAL LOW (ref 6–23)
CALCIUM: 9.1 mg/dL (ref 8.4–10.5)
CO2: 31 mmol/L (ref 19–32)
CREATININE: 1.02 mg/dL (ref 0.50–1.35)
Chloride: 105 mEq/L (ref 96–112)
GFR calc Af Amer: 88 mL/min — ABNORMAL LOW (ref >90)
GFR calc non Af Amer: 76 mL/min — ABNORMAL LOW (ref >90)
GLUCOSE: 95 mg/dL (ref 70–99)
Potassium: 3.5 mmol/L (ref 3.5–5.1)
Sodium: 139 mmol/L (ref 135–145)

## 2014-08-31 LAB — CBC
HCT: 38.2 % — ABNORMAL LOW (ref 39.0–52.0)
HEMOGLOBIN: 12.9 g/dL — AB (ref 13.0–17.0)
MCH: 27.4 pg (ref 26.0–34.0)
MCHC: 33.8 g/dL (ref 30.0–36.0)
MCV: 81.1 fL (ref 78.0–100.0)
Platelets: 200 10*3/uL (ref 150–400)
RBC: 4.71 MIL/uL (ref 4.22–5.81)
RDW: 13 % (ref 11.5–15.5)
WBC: 4.7 10*3/uL (ref 4.0–10.5)

## 2014-08-31 LAB — I-STAT TROPONIN, ED: Troponin i, poc: 0 ng/mL (ref 0.00–0.08)

## 2014-08-31 MED ORDER — PREDNISONE 10 MG PO TABS: 20.0000 mg | ORAL_TABLET | Freq: Every day | ORAL | Status: DC

## 2014-08-31 MED ORDER — AEROCHAMBER PLUS FLO-VU MEDIUM MISC
1.0000 | Freq: Once | Status: AC
  Administered 2014-08-31: 1
  Filled 2014-08-31: qty 1

## 2014-08-31 MED ORDER — ALBUTEROL SULFATE (2.5 MG/3ML) 0.083% IN NEBU
5.0000 mg | INHALATION_SOLUTION | Freq: Once | RESPIRATORY_TRACT | Status: AC
  Administered 2014-08-31: 5 mg via RESPIRATORY_TRACT
  Filled 2014-08-31: qty 6

## 2014-08-31 MED ORDER — ALBUTEROL SULFATE HFA 108 (90 BASE) MCG/ACT IN AERS
2.0000 | INHALATION_SPRAY | RESPIRATORY_TRACT | Status: DC | PRN
  Administered 2014-08-31: 2 via RESPIRATORY_TRACT
  Filled 2014-08-31: qty 6.7

## 2014-08-31 MED ORDER — PREDNISONE 20 MG PO TABS
60.0000 mg | ORAL_TABLET | Freq: Once | ORAL | Status: AC
  Administered 2014-08-31: 60 mg via ORAL
  Filled 2014-08-31: qty 3

## 2014-08-31 MED ORDER — LEVOFLOXACIN 500 MG PO TABS: 500.0000 mg | ORAL_TABLET | Freq: Every day | ORAL | Status: DC

## 2014-08-31 NOTE — ED Provider Notes (Signed)
CSN: [id number]     Arrival date & time 08/31/14  1122 History   First MD Initiated Contact with Patient 08/31/14 1209     Chief Complaint  Patient presents with  . Chest Pain  . Nasal Congestion  . Cough     (Consider location/radiation/quality/duration/timing/severity/associated sxs/prior Treatment) HPI  63 year old male with a history of asthma who comes in today complaining of cough and congestion for the past week. He states he has some chest discomfort that feels like tightness. It is worse with coughing and with laying down at night. He feels mildly dyspneic when he coughs. The cough is productive of yellowish sputum. He has had an inhaler in the past but has been out of it. He has felt mildly dyspneic. He has not had any fever or chills. He is not sure if he had his flu shot.  Past Medical History  Diagnosis Date  . Hypertension   . COPD (chronic obstructive pulmonary disease)   . CAD (coronary artery disease)   . GERD (gastroesophageal reflux disease)    History reviewed. No pertinent past surgical history. Family History  Problem Relation Age of Onset  . Heart disease Brother   . Heart disease Sister    History  Substance Use Topics  . Smoking status: Former Research scientist (life sciences)  . Smokeless tobacco: Never Used     Comment: Quit in 1988  . Alcohol Use: No    Review of Systems  All other systems reviewed and are negative.     Allergies  Review of patient's allergies indicates no known allergies.  Home Medications   Prior to Admission medications   Medication Sig Start Date End Date Taking? Authorizing Provider  acetaminophen (TYLENOL) 500 MG tablet Take 500 mg by mouth every 6 (six) hours as needed. For headache.    Yes Historical Provider, MD  methocarbamol (ROBAXIN) 500 MG tablet Take 1 tablet (500 mg total) by mouth 3 (three) times daily. Patient not taking: Reported on 08/31/2014 01/23/14   Carlisle Cater, PA-C  naproxen (NAPROSYN) 500 MG tablet Take 1 tablet (500 mg  total) by mouth 2 (two) times daily. Patient not taking: Reported on 08/31/2014 01/23/14   Carlisle Cater, PA-C  pantoprazole (PROTONIX) 20 MG tablet Take 1 tablet (20 mg total) by mouth daily. Patient not taking: Reported on 08/31/2014 12/07/13   Evelina Bucy, MD  predniSONE (DELTASONE) 20 MG tablet 3 Tabs PO Days 1-3, then 2 tabs PO Days 4-6, then 1 tab PO Day 7-9, then Half Tab PO Day 10-12 Patient not taking: Reported on 08/31/2014 01/23/14   Carlisle Cater, PA-C  traMADol (ULTRAM) 50 MG tablet Take 1 tablet (50 mg total) by mouth every 6 (six) hours as needed. Patient not taking: Reported on 08/31/2014 01/23/14   Carlisle Cater, PA-C   BP 144/90 mmHg  Pulse 68  Temp(Src) 97.8 F (36.6 C) (Oral)  Resp 20  Ht 5\' 3"  (1.6 m)  Wt 120 lb (54.432 kg)  BMI 21.26 kg/m2  SpO2 99% Physical Exam  Constitutional: He is oriented to person, place, and time. He appears well-developed and well-nourished.  HENT:  Head: Normocephalic and atraumatic.  Right Ear: External ear normal.  Left Ear: External ear normal.  Nose: Nose normal.  Mouth/Throat: Oropharynx is clear and moist.  Eyes: Conjunctivae and EOM are normal. Pupils are equal, round, and reactive to light.  Neck: Normal range of motion. Neck supple.  Cardiovascular: Normal rate, regular rhythm, normal heart sounds and intact distal pulses.   Pulmonary/Chest:  Effort normal and breath sounds normal. No respiratory distress. He has no wheezes. He exhibits no tenderness.  Mildly decreased breath sounds throughout  Abdominal: Soft. Bowel sounds are normal. He exhibits no distension and no mass. There is no tenderness. There is no guarding.  Musculoskeletal: Normal range of motion. He exhibits no edema or tenderness.  Neurological: He is alert and oriented to person, place, and time. He has normal reflexes. He exhibits normal muscle tone. Coordination normal.  Skin: Skin is warm and dry.  Psychiatric: He has a normal mood and affect. His behavior is  normal. Judgment and thought content normal.  Nursing note and vitals reviewed.   ED Course  Procedures (including critical care time) Labs Review Labs Reviewed  CBC - Abnormal; Notable for the following:    Hemoglobin 12.9 (*)    HCT 38.2 (*)    All other components within normal limits  BASIC METABOLIC PANEL - Abnormal; Notable for the following:    BUN <5 (*)    GFR calc non Af Amer 76 (*)    GFR calc Af Amer 88 (*)    Anion gap 3 (*)    All other components within normal limits  I-STAT TROPOININ, ED    Imaging Review Dg Chest 2 View  08/31/2014   CLINICAL DATA:  Chest pain and congestion for 2 days.  EXAM: CHEST  2 VIEW  COMPARISON:  Chest radiographs 12/07/2013  FINDINGS: Stable hyperinflation. The cardiomediastinal contours are normal. Pulmonary vasculature is normal. No consolidation, pleural effusion, or pneumothorax. No acute osseous abnormalities are seen.  IMPRESSION: Stable hyperinflation, no acute pulmonary process.   Electronically Signed   By: Jeb Levering M.D.   On: 08/31/2014 13:53     EKG Interpretation   Date/Time:  Tuesday August 31 2014 11:28:35 EST Ventricular Rate:  79 PR Interval:  146 QRS Duration: 82 QT Interval:  348 QTC Calculation: 399 R Axis:   -15 Text Interpretation:  Sinus rhythm with occasional Premature ventricular  complexes Possible Left atrial enlargement Nonspecific T wave abnormality  Abnormal ECG No significant change since last tracing Confirmed by Calyn Rubi MD,  Andee Poles ([id number]) on 08/31/2014 3:51:52 PM      MDM   Final diagnoses:  Chest congestion  COPD exacerbation   patient with chest tightness which appears to be consistent with his URI. Given his history of COPD he will be placed on antibiotics. He has had his albuterol refilled here. He is also placed on prednisone. His respiratory status appears stable and I have low index of suspicion for acute coronary syndrome or pulmonary embolism.    Shaune Pollack,  MD 09/06/14 [phone]

## 2014-08-31 NOTE — ED Notes (Signed)
Pt. Stated, I've had a cold for about a week with a cough, congestion and a cough.  My chest started hurting also , not sure if its from my cough.

## 2014-08-31 NOTE — Discharge Instructions (Signed)
Chronic Obstructive Pulmonary Disease Chronic obstructive pulmonary disease (COPD) is a common lung problem. In COPD, the flow of air from the lungs is limited. The way your lungs work will probably never return to normal, but there are things you can do to improve your lungs and make yourself feel better. HOME CARE  Take all medicines as told by your doctor.  Avoid medicines or cough syrups that dry up your airway (such as antihistamines) and do not allow you to get rid of thick spit. You do not need to avoid them if told differently by your doctor.  If you smoke, stop. Smoking makes the problem worse.  Avoid being around things that make your breathing worse (like smoke, chemicals, and fumes).  Use oxygen therapy and therapy to help improve your lungs (pulmonary rehabilitation) if told by your doctor. If you need home oxygen therapy, ask your doctor if you should buy a tool to measure your oxygen level (oximeter).  Avoid people who have a sickness you can catch (contagious).  Avoid going outside when it is very hot, cold, or humid.  Eat healthy foods. Eat smaller meals more often. Rest before meals.  Stay active, but remember to also rest.  Make sure to get all the shots (vaccines) your doctor recommends. Ask your doctor if you need a pneumonia shot.  Learn and use tips on how to relax.  Learn and use tips on how to control your breathing as told by your doctor. Try:  Breathing in (inhaling) through your nose for 1 second. Then, pucker your lips and breath out (exhale) through your lips for 2 seconds.  Putting one hand on your belly (abdomen). Breathe in slowly through your nose for 1 second. Your hand on your belly should move out. Pucker your lips and breathe out slowly through your lips. Your hand on your belly should move in as you breathe out.  Learn and use controlled coughing to clear thick spit from your lungs. The steps are: 1. Lean your head a little forward. 2. Breathe  in deeply. 3. Try to hold your breath for 3 seconds. 4. Keep your mouth slightly open while coughing 2 times. 5. Spit any thick spit out into a tissue. 6. Rest and do the steps again 1 or 2 times as needed. GET HELP IF:  You cough up more thick spit than usual.  There is a change in the color or thickness of the spit.  It is harder to breathe than usual.  Your breathing is faster than usual. GET HELP RIGHT AWAY IF:   You have shortness of breath while resting.  You have shortness of breath that stops you from:  Being able to talk.  Doing normal activities.  You chest hurts for longer than 5 minutes.  Your skin color is more blue than usual.  Your pulse oximeter shows that you have low oxygen for longer than 5 minutes. MAKE SURE YOU:   Understand these instructions.  Will watch your condition.  Will get help right away if you are not doing well or get worse. Document Released: 02/13/2008 Document Revised: 01/11/2014 Document Reviewed: 04/23/2013 ExitCare Patient Information 2015 [employer]. This information is not intended to replace advice given to you by your health care provider. Make sure you discuss any questions you have with your health care provider.  

## 2015-06-01 ENCOUNTER — Emergency Department (HOSPITAL_COMMUNITY)
Admission: EM | Admit: 2015-06-01 | Discharge: 2015-06-01 | Disposition: A | Discharge status: Home / Self Care | Payer: Medicare Other | Attending: Emergency Medicine | Admitting: Emergency Medicine

## 2015-06-01 ENCOUNTER — Encounter (HOSPITAL_COMMUNITY): Payer: Self-pay | Admitting: Emergency Medicine

## 2015-06-01 ENCOUNTER — Emergency Department (HOSPITAL_COMMUNITY): Payer: Medicare Other

## 2015-06-01 DIAGNOSIS — Z87891 Personal history of nicotine dependence: Secondary | ICD-10-CM | POA: Diagnosis not present

## 2015-06-01 DIAGNOSIS — I10 Essential (primary) hypertension: Secondary | ICD-10-CM | POA: Diagnosis not present

## 2015-06-01 DIAGNOSIS — J449 Chronic obstructive pulmonary disease, unspecified: Secondary | ICD-10-CM | POA: Insufficient documentation

## 2015-06-01 DIAGNOSIS — Z79899 Other long term (current) drug therapy: Secondary | ICD-10-CM | POA: Insufficient documentation

## 2015-06-01 DIAGNOSIS — K2970 Gastritis, unspecified, without bleeding: Secondary | ICD-10-CM

## 2015-06-01 DIAGNOSIS — I2510 Atherosclerotic heart disease of native coronary artery without angina pectoris: Secondary | ICD-10-CM | POA: Insufficient documentation

## 2015-06-01 DIAGNOSIS — K2100 Gastro-esophageal reflux disease with esophagitis, without bleeding: Secondary | ICD-10-CM

## 2015-06-01 DIAGNOSIS — R079 Chest pain, unspecified: Secondary | ICD-10-CM | POA: Diagnosis present

## 2015-06-01 LAB — CBC
HEMATOCRIT: 38.6 % — AB (ref 39.0–52.0)
HEMOGLOBIN: 12.9 g/dL — AB (ref 13.0–17.0)
MCH: 27.7 pg (ref 26.0–34.0)
MCHC: 33.4 g/dL (ref 30.0–36.0)
MCV: 82.8 fL (ref 78.0–100.0)
Platelets: 193 10*3/uL (ref 150–400)
RBC: 4.66 MIL/uL (ref 4.22–5.81)
RDW: 13.3 % (ref 11.5–15.5)
WBC: 4.6 10*3/uL (ref 4.0–10.5)

## 2015-06-01 LAB — I-STAT TROPONIN, ED
Troponin i, poc: 0.01 ng/mL (ref 0.00–0.08)
Troponin i, poc: 0.01 ng/mL (ref 0.00–0.08)

## 2015-06-01 LAB — BASIC METABOLIC PANEL
ANION GAP: 6 (ref 5–15)
BUN: 5 mg/dL — ABNORMAL LOW (ref 6–20)
CHLORIDE: 104 mmol/L (ref 101–111)
CO2: 28 mmol/L (ref 22–32)
Calcium: 9.4 mg/dL (ref 8.9–10.3)
Creatinine, Ser: 1.19 mg/dL (ref 0.61–1.24)
GFR calc non Af Amer: >60 mL/min (ref >60)
GLUCOSE: 87 mg/dL (ref 65–99)
Potassium: 3.4 mmol/L — ABNORMAL LOW (ref 3.5–5.1)
Sodium: 138 mmol/L (ref 135–145)

## 2015-06-01 MED ORDER — GI COCKTAIL ~~LOC~~
30.0000 mL | Freq: Once | ORAL | Status: AC
  Administered 2015-06-01: 30 mL via ORAL
  Filled 2015-06-01: qty 30

## 2015-06-01 MED ORDER — PANTOPRAZOLE SODIUM 20 MG PO TBEC: 20.0000 mg | DELAYED_RELEASE_TABLET | Freq: Every day | ORAL | Status: DC

## 2015-06-01 NOTE — ED Notes (Signed)
Patient with chest pain for the last month.  Patient denies any nausea, vomiting or shortness of breath with the pain.

## 2015-06-01 NOTE — ED Provider Notes (Signed)
CSN: [id number]     Arrival date & time 06/01/15  1930 History   First MD Initiated Contact with Patient 06/01/15 1958     Chief Complaint  Patient presents with  . Chest Pain   Patient is a 64 y.o. male presenting with general illness. The history is provided by the patient. No language interpreter was used.  Illness Location:  Chest Quality:  Pain Severity:  Mild Onset quality:  Gradual Timing:  Intermittent Progression:  Waxing and waning Chronicity:  Recurrent Context:  PMHx of GERD presenting with CP. Onset several months ago. Occurring daily. Worse after eating and with lying down. Substernal burning sensation radiating to throat. Denies SOB, diaphoresis, and nausea. Patient states this feels similar to previous reflux. Associated symptoms: chest pain   Associated symptoms: no abdominal pain, no cough, no diarrhea, no fever, no headaches, no loss of consciousness, no nausea, no shortness of breath and no sore throat     Past Medical History  Diagnosis Date  . Hypertension   . COPD (chronic obstructive pulmonary disease)   . CAD (coronary artery disease)   . GERD (gastroesophageal reflux disease)    History reviewed. No pertinent past surgical history. Family History  Problem Relation Age of Onset  . Heart disease Brother   . Heart disease Sister    Social History  Substance Use Topics  . Smoking status: Former Research scientist (life sciences)  . Smokeless tobacco: Never Used     Comment: Quit in 1988  . Alcohol Use: No    Review of Systems  Constitutional: Negative for fever.  HENT: Negative for sore throat.   Respiratory: Negative for cough and shortness of breath.   Cardiovascular: Positive for chest pain. Negative for palpitations and leg swelling.  Gastrointestinal: Negative for nausea, abdominal pain and diarrhea.  Neurological: Negative for loss of consciousness and headaches.  All other systems reviewed and are negative.   Allergies  Review of patient's allergies indicates no  known allergies.  Home Medications   Prior to Admission medications   Medication Sig Start Date End Date Taking? Authorizing Provider  naphazoline-glycerin (CLEAR EYES) 0.012-0.2 % SOLN Place 1-2 drops into both eyes every 4 (four) hours as needed for irritation.   Yes Historical Provider, MD  levofloxacin (LEVAQUIN) 500 MG tablet Take 1 tablet (500 mg total) by mouth daily. Patient not taking: Reported on 06/01/2015 08/31/14   Pattricia Boss, MD  methocarbamol (ROBAXIN) 500 MG tablet Take 1 tablet (500 mg total) by mouth 3 (three) times daily. Patient not taking: Reported on 08/31/2014 01/23/14   Carlisle Cater, PA-C  naproxen (NAPROSYN) 500 MG tablet Take 1 tablet (500 mg total) by mouth 2 (two) times daily. Patient not taking: Reported on 08/31/2014 01/23/14   Carlisle Cater, PA-C  pantoprazole (PROTONIX) 20 MG tablet Take 1 tablet (20 mg total) by mouth daily. 06/01/15   Mayer Camel, MD  predniSONE (DELTASONE) 10 MG tablet Take 2 tablets (20 mg total) by mouth daily. Patient not taking: Reported on 06/01/2015 08/31/14   Pattricia Boss, MD  traMADol (ULTRAM) 50 MG tablet Take 1 tablet (50 mg total) by mouth every 6 (six) hours as needed. Patient not taking: Reported on 08/31/2014 01/23/14   Carlisle Cater, PA-C   BP 155/93 mmHg  Pulse 58  Temp(Src) 97.6 F (36.4 C) (Oral)  Resp 19  Ht 5\' 3"  (1.6 m)  Wt 125 lb (56.7 kg)  BMI 22.15 kg/m2  SpO2 100%   Physical Exam  Constitutional: He is oriented to person,  place, and time. He appears well-developed and well-nourished. No distress.  HENT:  Head: Normocephalic.  Eyes: Conjunctivae are normal. Pupils are equal, round, and reactive to light.  Neck: Normal range of motion. Neck supple.  Cardiovascular: Normal rate, regular rhythm and intact distal pulses.   Pulmonary/Chest: Effort normal and breath sounds normal.  Abdominal: Soft. Bowel sounds are normal.  Musculoskeletal: Normal range of motion.  Neurological: He is alert and oriented to  person, place, and time.  Skin: Skin is warm and dry. He is not diaphoretic.    ED Course  Procedures   Labs Review Labs Reviewed  BASIC METABOLIC PANEL - Abnormal; Notable for the following:    Potassium 3.4 (*)    BUN 5 (*)    All other components within normal limits  CBC - Abnormal; Notable for the following:    Hemoglobin 12.9 (*)    HCT 38.6 (*)    All other components within normal limits  I-STAT TROPOININ, ED  Randolm Idol, ED   Imaging Review Dg Chest 2 View  06/01/2015   CLINICAL DATA:  Chest pain for 1 month.  EXAM: CHEST  2 VIEW  COMPARISON:  August 31, 2014.  FINDINGS: The heart size and mediastinal contours are within normal limits. Both lungs are clear. No pneumothorax or pleural effusion is noted. The visualized skeletal structures are unremarkable.  IMPRESSION: No active cardiopulmonary disease.   Electronically Signed   By: Marijo Conception, M.D.   On: 06/01/2015 21:25   I have personally reviewed and evaluated these images and lab results as part of my medical decision-making.   EKG Interpretation   Date/Time:  Wednesday June 01 2015 20:04:03 EDT Ventricular Rate:  62 PR Interval:  149 QRS Duration: 91 QT Interval:  397 QTC Calculation: 403 R Axis:   -37 Text Interpretation:  Sinus rhythm Abnormal R-wave progression, early  transition Left ventricular hypertrophy No significant change since last  tracing Confirmed by YAO  MD, DAVID ([id number]) on 06/01/2015 10:09:47 PM      MDM  Mr. Schrecengost is a 64 yo male w/ PMHx of GERD presenting with CP. Onset several months ago. Occurring daily. Exertional. Nonradiating. Worse after eating and with lying down. Substernal burning sensation radiating to throat. Denies SOB, diaphoresis, and nausea. Patient states this feels similar to previous reflux.  Exam above notable for elderly male lying in stretcher in no acute distress. Afebrile. Not tachycardic. Hypertensive. Breathing well on RA and maintaining  saturations without supplemental oxygen. Lungs CTAB. Abd benign. CV RRR. Remainder of examination unremarkable.  Patient risk stratified using heart score to low risk. EKG showing evidence of ST elevation in anterior leads consistent with early removal and unchanged from previous EKGs. Delta troponin within normal limits. CBC and BMP unremarkable. Chest x-ray showing no acute cardiopulmonary process.. Laboratory and imaging results were personally reviewed by myself and used in the medical decision making of this patient's treatment and disposition.  Patient given GI cocktail with near resolution in pain. Patient provided with a prescription for Prilosec at home given likely cause of pain is gastritis.  Pt discharged home in stable condition. Strict ED return precautions dicussed. Pt understands and agrees with the plan and has no further questions or concerns.   Pt care discussed with and followed by my attending, Dr. Shirlyn Goltz  Mayer Camel, MD Pager [phone]   Final diagnoses:  Gastritis  Gastroesophageal reflux disease with esophagitis    Mayer Camel, MD 06/02/15 [place]  Darl Householder, MD 06/02/15 1121

## 2015-06-01 NOTE — ED Notes (Signed)
Pt A&Ox4, ambulatory at d/c with steady gait, NAD 

## 2015-10-28 DIAGNOSIS — Z1211 Encounter for screening for malignant neoplasm of colon: Secondary | ICD-10-CM | POA: Insufficient documentation

## 2015-10-28 DIAGNOSIS — N138 Other obstructive and reflux uropathy: Secondary | ICD-10-CM | POA: Insufficient documentation

## 2015-10-28 DIAGNOSIS — N401 Enlarged prostate with lower urinary tract symptoms: Secondary | ICD-10-CM

## 2015-10-28 DIAGNOSIS — Z136 Encounter for screening for cardiovascular disorders: Secondary | ICD-10-CM | POA: Insufficient documentation

## 2015-11-04 ENCOUNTER — Encounter: Payer: Self-pay | Admitting: Internal Medicine

## 2015-11-25 DIAGNOSIS — R972 Elevated prostate specific antigen [PSA]: Secondary | ICD-10-CM | POA: Insufficient documentation

## 2015-11-30 ENCOUNTER — Ambulatory Visit (AMBULATORY_SURGERY_CENTER): Payer: Self-pay

## 2015-11-30 VITALS — Ht 63.0 in | Wt 116.0 lb

## 2015-11-30 DIAGNOSIS — Z86010 Personal history of colonic polyps: Secondary | ICD-10-CM

## 2015-11-30 MED ORDER — NA SULFATE-K SULFATE-MG SULF 17.5-3.13-1.6 GM/177ML PO SOLN: 1.0000 | Freq: Once | ORAL | Status: DC

## 2015-11-30 NOTE — Progress Notes (Signed)
No egg or soy allergies Not on home 02 No previous anesthesia complications No diet or weight loss meds 

## 2015-12-08 ENCOUNTER — Encounter: Payer: Self-pay | Admitting: *Deleted

## 2015-12-08 ENCOUNTER — Telehealth: Payer: Self-pay | Admitting: Internal Medicine

## 2015-12-08 NOTE — Telephone Encounter (Signed)
Pt states prep is too expensive. He states it was 47$ and that is too much for him to pay.   We have no suprep samples and neither does 3rd floor.  Changed to miralax mixed in gatorade   Pt to pick up new instructions Friday 12-09-15.   Lelan Pons PV

## 2015-12-14 ENCOUNTER — Encounter: Payer: Self-pay | Admitting: Internal Medicine

## 2015-12-14 ENCOUNTER — Ambulatory Visit (AMBULATORY_SURGERY_CENTER): Payer: Medicare Other | Admitting: Internal Medicine

## 2015-12-14 VITALS — BP 114/78 | HR 68 | Temp 98.4°F | Resp 16 | Ht 63.0 in | Wt 116.0 lb

## 2015-12-14 DIAGNOSIS — D123 Benign neoplasm of transverse colon: Secondary | ICD-10-CM | POA: Diagnosis not present

## 2015-12-14 DIAGNOSIS — Z86010 Personal history of colonic polyps: Secondary | ICD-10-CM | POA: Diagnosis present

## 2015-12-14 DIAGNOSIS — D122 Benign neoplasm of ascending colon: Secondary | ICD-10-CM

## 2015-12-14 MED ORDER — SODIUM CHLORIDE 0.9 % IV SOLN: 500.0000 mL | INTRAVENOUS | Status: DC

## 2015-12-14 NOTE — Progress Notes (Signed)
A/ox3, pleased with MAC, report to RN 

## 2015-12-14 NOTE — Patient Instructions (Signed)
Colon polyps removed and diverticulosis seen today. Handouts given on polyps, diverticulosis. Repeat colonoscopy in 5 years.  Resume current medications. Call us with any questions or concerns. Thank you!   YOU HAD AN ENDOSCOPIC PROCEDURE TODAY AT [facility]:   Refer to the procedure report that was given to you for any specific questions about what was found during the examination.  If the procedure report does not answer your questions, please call your gastroenterologist to clarify.  If you requested that your care partner not be given the details of your procedure findings, then the procedure report has been included in a sealed envelope for you to review at your convenience later.  YOU SHOULD EXPECT: Some feelings of bloating in the abdomen. Passage of more gas than usual.  Walking can help get rid of the air that was put into your GI tract during the procedure and reduce the bloating. If you had a lower endoscopy (such as a colonoscopy or flexible sigmoidoscopy) you may notice spotting of blood in your stool or on the toilet paper. If you underwent a bowel prep for your procedure, you may not have a normal bowel movement for a few days.  Please Note:  You might notice some irritation and congestion in your nose or some drainage.  This is from the oxygen used during your procedure.  There is no need for concern and it should clear up in a day or so.  SYMPTOMS TO REPORT IMMEDIATELY:   Following lower endoscopy (colonoscopy or flexible sigmoidoscopy):  Excessive amounts of blood in the stool  Significant tenderness or worsening of abdominal pains  Swelling of the abdomen that is new, acute  Fever of 100F or higher   For urgent or emergent issues, a gastroenterologist can be reached at any hour by calling [phone].   DIET: Your first meal following the procedure should be a small meal and then it is ok to progress to your normal diet. Heavy or fried foods are harder  to digest and may make you feel nauseous or bloated.  Likewise, meals heavy in dairy and vegetables can increase bloating.  Drink plenty of fluids but you should avoid alcoholic beverages for 24 hours.  ACTIVITY:  You should plan to take it easy for the rest of today and you should NOT DRIVE or use heavy machinery until tomorrow (because of the sedation medicines used during the test).    FOLLOW UP: Our staff will call the number listed on your records the next business day following your procedure to check on you and address any questions or concerns that you may have regarding the information given to you following your procedure. If we do not reach you, we will leave a message.  However, if you are feeling well and you are not experiencing any problems, there is no need to return our call.  We will assume that you have returned to your regular daily activities without incident.  If any biopsies were taken you will be contacted by phone or by letter within the next 1-3 weeks.  Please call us at [phone] if you have not heard about the biopsies in 3 weeks.    SIGNATURES/CONFIDENTIALITY: You and/or your care partner have signed paperwork which will be entered into your electronic medical record.  These signatures attest to the fact that that the information above on your After Visit Summary has been reviewed and is understood.  Full responsibility of the confidentiality of this discharge  information lies with you and/or your care-partner. 

## 2015-12-14 NOTE — Progress Notes (Signed)
Called to room to assist during endoscopic procedure.  Patient ID and intended procedure confirmed with present staff. Received instructions for my participation in the procedure from the performing physician.  

## 2015-12-14 NOTE — Op Note (Signed)
[facility] Patient Name: [name] Procedure Date: 12/14/2015 1:37 PM MRN: [id number] Endoscopist: Docia Chuck. Henrene Pastor , MD Age: 65 Referring MD:  Date of Birth: 01-07-1951 Gender: Male Procedure:                Colonoscopy with snare polypectomy -2 Indications:              High risk colon cancer surveillance: Personal                            history of colonic polyps. multiple prior                            colonoscopies dating back to 1998 with Dr. Velora Heckler.                            Documented adenomatous polyps. Last examination                            2005 was negative Medicines:                Monitored Anesthesia Care Procedure:                Pre-Anesthesia Assessment:                           - Prior to the procedure, a History and Physical                            was performed, and patient medications and                            allergies were reviewed. The patient's tolerance of                            previous anesthesia was also reviewed. The risks                            and benefits of the procedure and the sedation                            options and risks were discussed with the patient.                            All questions were answered, and informed consent                            was obtained. Prior Anticoagulants: The patient has                            taken no previous anticoagulant or antiplatelet                            agents. ASA Grade Assessment: II - A patient with  mild systemic disease. After reviewing the risks                            and benefits, the patient was deemed in                            satisfactory condition to undergo the procedure.                           After obtaining informed consent, the colonoscope                            was passed under direct vision. Throughout the                            procedure, the patient's blood pressure, pulse, and                         oxygen saturations were monitored continuously. The                            Model CF-HQ190L [id number]) scope was introduced                            through the anus and advanced to the the cecum,                            identified by the appendiceal orifice, ileocecal                            valve and palpation. The colonoscopy was performed                            without difficulty. The patient tolerated the                            procedure well. The quality of the bowel                            preparation was excellent. The bowel preparation                            used was SUPREP. The ileocecal valve, appendiceal                            orifice, and rectum were photographed. Scope In: 1:42:03 PM Scope Out: 1:54:44 PM Scope Withdrawal Time: 0 hours 11 minutes 1 second  Total Procedure Duration: 0 hours 12 minutes 41 seconds  Findings:      The digital rectal exam was normal.      Two polyps were found in the transverse colon and ascending colon. The       polyps were 3 to 5 mm in size. These polyps were removed with a cold       snare. Resection and retrieval were complete.  Multiple medium-mouthed diverticula were found in the sigmoid colon.      The exam was otherwise without abnormality on direct and retroflexion       views. Complications:            No immediate complications. Estimated Blood Loss:     Estimated blood loss: none. Impression:               - Two 3 to 5 mm polyps in the transverse colon and                            in the ascending colon, removed with a cold snare.                            Resected and retrieved.                           - Diverticulosis in the sigmoid colon.                           - The examination was otherwise normal on direct                            and retroflexion views. Recommendation:           - Patient has a contact number available for                             emergencies. The signs and symptoms of potential                            delayed complications were discussed with the                            patient. Return to normal activities tomorrow.                            Written discharge instructions were provided to the                            patient.                           - Resume previous diet.                           - Continue present medications.                           - Await pathology results.                           - Repeat colonoscopy in 5 years for surveillance. Docia Chuck. Henrene Pastor, MD 12/14/2015 2:11:45 PM This report has been signed electronically. Number of Addenda: 0 CC Letter to:             Bernerd Limbo Referring MD:      Bernerd Limbo

## 2015-12-15 ENCOUNTER — Telehealth: Payer: Self-pay | Admitting: *Deleted

## 2015-12-15 NOTE — Telephone Encounter (Signed)
No answer, patient's message stated unable to leave at message. No message left.

## 2015-12-19 ENCOUNTER — Encounter: Payer: Self-pay | Admitting: Internal Medicine

## 2016-08-03 DIAGNOSIS — Z136 Encounter for screening for cardiovascular disorders: Secondary | ICD-10-CM | POA: Insufficient documentation

## 2016-08-03 DIAGNOSIS — Z0000 Encounter for general adult medical examination without abnormal findings: Secondary | ICD-10-CM | POA: Insufficient documentation

## 2016-08-03 DIAGNOSIS — Z1159 Encounter for screening for other viral diseases: Secondary | ICD-10-CM | POA: Insufficient documentation

## 2016-09-22 ENCOUNTER — Emergency Department (HOSPITAL_COMMUNITY): Payer: Medicare Other

## 2016-09-22 ENCOUNTER — Emergency Department (HOSPITAL_COMMUNITY)
Admission: EM | Admit: 2016-09-22 | Discharge: 2016-09-22 | Disposition: A | Discharge status: Home / Self Care | Payer: Medicare Other | Attending: Emergency Medicine | Admitting: Emergency Medicine

## 2016-09-22 ENCOUNTER — Encounter (HOSPITAL_COMMUNITY): Payer: Self-pay | Admitting: Emergency Medicine

## 2016-09-22 DIAGNOSIS — I10 Essential (primary) hypertension: Secondary | ICD-10-CM | POA: Insufficient documentation

## 2016-09-22 DIAGNOSIS — R0600 Dyspnea, unspecified: Secondary | ICD-10-CM | POA: Diagnosis not present

## 2016-09-22 DIAGNOSIS — R059 Cough, unspecified: Secondary | ICD-10-CM

## 2016-09-22 DIAGNOSIS — J449 Chronic obstructive pulmonary disease, unspecified: Secondary | ICD-10-CM | POA: Diagnosis not present

## 2016-09-22 DIAGNOSIS — R05 Cough: Secondary | ICD-10-CM

## 2016-09-22 DIAGNOSIS — Z79899 Other long term (current) drug therapy: Secondary | ICD-10-CM | POA: Diagnosis not present

## 2016-09-22 DIAGNOSIS — Z87891 Personal history of nicotine dependence: Secondary | ICD-10-CM | POA: Insufficient documentation

## 2016-09-22 DIAGNOSIS — R0602 Shortness of breath: Secondary | ICD-10-CM | POA: Diagnosis present

## 2016-09-22 LAB — CBC WITH DIFFERENTIAL/PLATELET
BASOS ABS: 0.1 10*3/uL (ref 0.0–0.1)
Basophils Relative: 1 %
EOS ABS: 0.2 10*3/uL (ref 0.0–0.7)
EOS PCT: 4 %
HCT: 45.7 % (ref 39.0–52.0)
Hemoglobin: 15.7 g/dL (ref 13.0–17.0)
LYMPHS PCT: 40 %
Lymphs Abs: 2 10*3/uL (ref 0.7–4.0)
MCH: 28.2 pg (ref 26.0–34.0)
MCHC: 34.4 g/dL (ref 30.0–36.0)
MCV: 82 fL (ref 78.0–100.0)
Monocytes Absolute: 0.4 10*3/uL (ref 0.1–1.0)
Monocytes Relative: 7 %
NEUTROS PCT: 48 %
Neutro Abs: 2.4 10*3/uL (ref 1.7–7.7)
PLATELETS: 206 10*3/uL (ref 150–400)
RBC: 5.57 MIL/uL (ref 4.22–5.81)
RDW: 12.9 % (ref 11.5–15.5)
WBC: 5 10*3/uL (ref 4.0–10.5)

## 2016-09-22 LAB — BASIC METABOLIC PANEL
ANION GAP: 8 (ref 5–15)
BUN: <5 mg/dL — ABNORMAL LOW (ref 6–20)
CALCIUM: 9.8 mg/dL (ref 8.9–10.3)
CO2: 29 mmol/L (ref 22–32)
Chloride: 100 mmol/L — ABNORMAL LOW (ref 101–111)
Creatinine, Ser: 1.08 mg/dL (ref 0.61–1.24)
Glucose, Bld: 108 mg/dL — ABNORMAL HIGH (ref 65–99)
POTASSIUM: 3.8 mmol/L (ref 3.5–5.1)
Sodium: 137 mmol/L (ref 135–145)

## 2016-09-22 LAB — I-STAT TROPONIN, ED: Troponin i, poc: 0 ng/mL (ref 0.00–0.08)

## 2016-09-22 MED ORDER — ALBUTEROL SULFATE HFA 108 (90 BASE) MCG/ACT IN AERS
2.0000 | INHALATION_SPRAY | Freq: Once | RESPIRATORY_TRACT | Status: AC
  Administered 2016-09-22: 2 via RESPIRATORY_TRACT
  Filled 2016-09-22: qty 6.7

## 2016-09-22 MED ORDER — DEXTROMETHORPHAN-GUAIFENESIN 10-100 MG/5ML PO LIQD: 5.0000 mL | ORAL | 0 refills | Status: DC | PRN

## 2016-09-22 MED ORDER — PREDNISONE 20 MG PO TABS
60.0000 mg | ORAL_TABLET | ORAL | Status: AC
  Administered 2016-09-22: 60 mg via ORAL
  Filled 2016-09-22: qty 3

## 2016-09-22 MED ORDER — PREDNISONE 20 MG PO TABS: 40.0000 mg | ORAL_TABLET | Freq: Every day | ORAL | 0 refills | Status: DC

## 2016-09-22 NOTE — ED Notes (Signed)
Patient transported to X-ray 

## 2016-09-22 NOTE — Discharge Instructions (Signed)
As discussed, your evaluation today has been largely reassuring.  But, it is important that you monitor your condition carefully, and do not hesitate to return to the ED if you develop new, or concerning changes in your condition.  There is some suspicion for underlying pulmonary disease, and following up with your primary care physician is very important. Please take all medication as directed, and return here for concerning changes in your condition.

## 2016-09-22 NOTE — ED Provider Notes (Signed)
[name] DEPT Provider Note   CSN: [id number] Arrival date & time: 09/22/16  [id number]     History   Chief Complaint Chief Complaint  Patient presents with  . Shortness of Breath  . Chest Pain    HPI [name] is a 66 y.o. male.  HPI Patient presents with concern of cough, dyspnea. Patient denies history of pulmonary disease, but chart review suggests patient has COPD. Patient is a former smoker. This illness began yesterday, since onset has slightly improved, without clear intervention beyond OTC medication. He specifically denies focal chest pain, fever, confusion, disorientation, syncope. No recent changes in health status, new medication, diet, activity.    Past Medical History:  Diagnosis Date  . Asthma   . CAD (coronary artery disease)   . COPD (chronic obstructive pulmonary disease) ([place])   . GERD (gastroesophageal reflux disease)   . Hypertension     Patient Active Problem List   Diagnosis Date Noted  . Elevated prostate specific antigen (PSA) 11/25/2015  . 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 HYPERTENSION 12/24/2007  . Essential (primary) hypertension 12/24/2007  . ASTHMA 12/23/2007  . C O P D 12/23/2007  . G E R D 12/23/2007  . Chronic obstructive pulmonary disease ([place]) 12/23/2007  . Acid reflux 12/23/2007    Past Surgical History:  Procedure Laterality Date  . COLONOSCOPY  2005   last colon  . POLYPECTOMY  2000       Home Medications    Prior to Admission medications   Medication Sig Start Date End Date Taking? Authorizing Provider  naphazoline-glycerin (CLEAR EYES) 0.012-0.2 % SOLN Place 1-2 drops into both eyes every 4 (four) hours as needed for irritation. Reported on 11/30/2015   Yes Historical Provider, MD  NON FORMULARY Take 1 capsule by mouth daily. Prostate supplement   Yes Historical Provider, MD  dextromethorphan-guaiFENesin (ROBITUSSIN-DM) 10-100 MG/5ML liquid Take 5 mLs by mouth every 4  (four) hours as needed for cough. 09/22/16   Carmin Muskrat, MD  naproxen (NAPROSYN) 500 MG tablet Take 1 tablet (500 mg total) by mouth 2 (two) times daily. Patient not taking: Reported on 09/22/2016 01/23/14   Carlisle Cater, PA-C  pantoprazole (PROTONIX) 20 MG tablet Take 1 tablet (20 mg total) by mouth daily. Patient not taking: Reported on 09/22/2016 06/01/15   Mayer Camel, MD  predniSONE (DELTASONE) 20 MG tablet Take 2 tablets (40 mg total) by mouth daily with breakfast. For the next four days 09/22/16   Carmin Muskrat, MD  tamsulosin (FLOMAX) 0.4 MG CAPS capsule Take 0.4 mg by mouth. 10/28/15   Historical Provider, MD  traMADol (ULTRAM) 50 MG tablet Take 1 tablet (50 mg total) by mouth every 6 (six) hours as needed. Patient not taking: Reported on 09/22/2016 01/23/14   Carlisle Cater, PA-C    Family History Family History  Problem Relation Age of Onset  . Heart disease Brother   . Heart disease Sister   . Colon cancer Neg Hx     Social History Social History  Substance Use Topics  . Smoking status: Former Research scientist (life sciences)  . Smokeless tobacco: Never Used     Comment: Quit in 1988  . Alcohol use No     Allergies   Patient has no known allergies.   Review of Systems Review of Systems  Constitutional:       Per HPI, otherwise negative  HENT:       Per HPI, otherwise negative  Respiratory:  Per HPI, otherwise negative  Cardiovascular:       Per HPI, otherwise negative  Gastrointestinal: Negative for vomiting.  Endocrine:       Negative aside from HPI  Genitourinary:       Neg aside from HPI   Musculoskeletal:       Per HPI, otherwise negative  Skin: Negative.   Neurological: Negative for syncope.     Physical Exam Updated Vital Signs BP 159/98   Pulse 67   Temp 98 F (36.7 C) (Oral)   Resp 17   SpO2 98%   Physical Exam  Constitutional: He is oriented to person, place, and time. He appears well-developed. No distress.  HENT:  Head: Normocephalic and  atraumatic.  Eyes: Conjunctivae and EOM are normal.  Cardiovascular: Normal rate and regular rhythm.   Pulmonary/Chest: Effort normal. No stridor. No respiratory distress.  Diminished breath sounds no wheezing  Abdominal: He exhibits no distension.  Musculoskeletal: He exhibits no edema.  Neurological: He is alert and oriented to person, place, and time.  Skin: Skin is warm and dry.  Psychiatric: He has a normal mood and affect.  Nursing note and vitals reviewed.    ED Treatments / Results  Labs (all labs ordered are listed, but only abnormal results are displayed) Labs Reviewed  BASIC METABOLIC PANEL - Abnormal; Notable for the following:       Result Value   Chloride 100 (*)    Glucose, Bld 108 (*)    BUN <5 (*)    All other components within normal limits  CBC WITH DIFFERENTIAL/PLATELET  Randolm Idol, ED    EKG  EKG Interpretation  Date/Time:  Saturday September 22 2016 08:28:26 EST Ventricular Rate:  83 PR Interval:  152 QRS Duration: 90 QT Interval:  360 QTC Calculation: 423 R Axis:   -49 Text Interpretation:  Normal sinus rhythm Left anterior fascicular block Left ventricular hypertrophy Nonspecific ST and T wave abnormality Abnormal ekg Confirmed by Carmin Muskrat  MD [phone]) on 09/22/2016 8:51:17 AM       Radiology Dg Chest 2 View  Result Date: 09/22/2016 CLINICAL DATA:  [name] Hx of HTN, COPD, CAD. Former smoker. EXAM: CHEST  2 VIEW COMPARISON:  06/01/2015 FINDINGS: Midline trachea. Normal heart size and mediastinal contours. No pleural effusion or pneumothorax. Mild hyperinflation. Clear lungs. Lucency under the left hemidiaphragm is likely gas within the stomach. IMPRESSION: Hyperinflation, without acute disease. Lucency under the left hemidiaphragm is likely gas within the stomach. If there is a concern of free intraperitoneal air, recommend dedicated abdominal radiographs. Electronically Signed    By: Abigail Miyamoto M.D.   On: 09/22/2016 09:33    Procedures Procedures (including critical care time)  Medications Ordered in ED Medications  predniSONE (DELTASONE) tablet 60 mg (not administered)     Initial Impression / Assessment and Plan / ED Course  I have reviewed the triage vital signs and the nursing notes.  Pertinent labs & imaging results that were available during my care of the patient were reviewed by me and considered in my medical decision making (see chart for details).  Clinical Course     On repeat exam the patient is awake and alert, states that he feels better, appears well. We discussed all findings, including possibility of COPD exacerbation. Patient will follow-up with primary care. Absent evidence for pneumonia, hypoxia, chest pain, evidence for ACS, and was suspicion for COPD, patient was  discharged after initiating steroids, symptomatically control.   Final Clinical Impressions(s) / ED Diagnoses   Final diagnoses:  Dyspnea, unspecified type  Cough    New Prescriptions New Prescriptions   DEXTROMETHORPHAN-GUAIFENESIN (ROBITUSSIN-DM) 10-100 MG/5ML LIQUID    Take 5 mLs by mouth every 4 (four) hours as needed for cough.   PREDNISONE (DELTASONE) 20 MG TABLET    Take 2 tablets (40 mg total) by mouth daily with breakfast. For the next four days     Carmin Muskrat, MD 09/22/16 1132

## 2016-09-22 NOTE — ED Triage Notes (Signed)
Pt reports last night he experienced trouble getting a good breath in his upper chest and had to sleep sitting up. Pt reports he had occasional chest pain. Pt reports he has asthma. Pt reports it did not feel like an asthma attack. Pt denies shortness of breath or chest pain currently.

## 2016-09-22 NOTE — ED Notes (Signed)
Pt verbalizes understanding of discharge instructions and importance of follow up with primary care physician. NAD. Refused wheelchair at departure.

## 2016-11-27 ENCOUNTER — Emergency Department (HOSPITAL_COMMUNITY): Payer: Medicare Other

## 2016-11-27 ENCOUNTER — Encounter (HOSPITAL_COMMUNITY): Payer: Self-pay

## 2016-11-27 ENCOUNTER — Emergency Department (HOSPITAL_COMMUNITY)
Admission: EM | Admit: 2016-11-27 | Discharge: 2016-11-27 | Disposition: A | Discharge status: Home / Self Care | Payer: Medicare Other | Attending: Emergency Medicine | Admitting: Emergency Medicine

## 2016-11-27 DIAGNOSIS — Z79899 Other long term (current) drug therapy: Secondary | ICD-10-CM | POA: Diagnosis not present

## 2016-11-27 DIAGNOSIS — I10 Essential (primary) hypertension: Secondary | ICD-10-CM | POA: Insufficient documentation

## 2016-11-27 DIAGNOSIS — R072 Precordial pain: Secondary | ICD-10-CM

## 2016-11-27 DIAGNOSIS — J449 Chronic obstructive pulmonary disease, unspecified: Secondary | ICD-10-CM | POA: Diagnosis not present

## 2016-11-27 DIAGNOSIS — I2510 Atherosclerotic heart disease of native coronary artery without angina pectoris: Secondary | ICD-10-CM | POA: Insufficient documentation

## 2016-11-27 DIAGNOSIS — Z87891 Personal history of nicotine dependence: Secondary | ICD-10-CM | POA: Insufficient documentation

## 2016-11-27 DIAGNOSIS — R079 Chest pain, unspecified: Secondary | ICD-10-CM | POA: Diagnosis present

## 2016-11-27 LAB — CBC
HCT: 43.8 % (ref 39.0–52.0)
Hemoglobin: 14.7 g/dL (ref 13.0–17.0)
MCH: 27.6 pg (ref 26.0–34.0)
MCHC: 33.6 g/dL (ref 30.0–36.0)
MCV: 82.2 fL (ref 78.0–100.0)
Platelets: 241 10*3/uL (ref 150–400)
RBC: 5.33 MIL/uL (ref 4.22–5.81)
RDW: 13.2 % (ref 11.5–15.5)
WBC: 4.5 10*3/uL (ref 4.0–10.5)

## 2016-11-27 LAB — BASIC METABOLIC PANEL
Anion gap: 8 (ref 5–15)
BUN: 10 mg/dL (ref 6–20)
CO2: 25 mmol/L (ref 22–32)
Calcium: 10 mg/dL (ref 8.9–10.3)
Chloride: 102 mmol/L (ref 101–111)
Creatinine, Ser: 1.48 mg/dL — ABNORMAL HIGH (ref 0.61–1.24)
GFR calc Af Amer: 55 mL/min — ABNORMAL LOW (ref >60)
GFR, EST NON AFRICAN AMERICAN: 48 mL/min — AB (ref >60)
GLUCOSE: 114 mg/dL — AB (ref 65–99)
POTASSIUM: 4.9 mmol/L (ref 3.5–5.1)
Sodium: 135 mmol/L (ref 135–145)

## 2016-11-27 LAB — I-STAT TROPONIN, ED: TROPONIN I, POC: 0.01 ng/mL (ref 0.00–0.08)

## 2016-11-27 MED ORDER — ASPIRIN 81 MG PO CHEW: 81.0000 mg | CHEWABLE_TABLET | Freq: Every day | ORAL | 1 refills | Status: DC

## 2016-11-27 NOTE — ED Triage Notes (Signed)
Onset 1 month chest pain "all over".  Worse in the morning upon awakening.  No other s/s noted.

## 2016-11-27 NOTE — ED Provider Notes (Signed)
[facility] DEPT Provider Note   CSN: [id number] Arrival date & time: 11/27/16  1201     History   Chief Complaint Chief Complaint  Patient presents with  . Chest Pain    HPI [name] is a 66 y.o. male.  Patient presents with intermittent anterior chest pain lasting seconds coming and going for over a month. Was worse this morning upon awakening. No shortness of breath no nausea vomiting no diaphoresis. Does not radiate to the back no abdominal pain.      Past Medical History:  Diagnosis Date  . Asthma   . CAD (coronary artery disease)   . COPD (chronic obstructive pulmonary disease) ([place])   . GERD (gastroesophageal reflux disease)   . Hypertension     Patient Active Problem List   Diagnosis Date Noted  . Elevated prostate specific antigen (PSA) 11/25/2015  . 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 HYPERTENSION 12/24/2007  . Essential (primary) hypertension 12/24/2007  . ASTHMA 12/23/2007  . C O P D 12/23/2007  . G E R D 12/23/2007  . Chronic obstructive pulmonary disease ([place]) 12/23/2007  . Acid reflux 12/23/2007    Past Surgical History:  Procedure Laterality Date  . COLONOSCOPY  2005   last colon  . POLYPECTOMY  2000       Home Medications    Prior to Admission medications   Medication Sig Start Date End Date Taking? Authorizing Provider  dextromethorphan-guaiFENesin (ROBITUSSIN-DM) 10-100 MG/5ML liquid Take 5 mLs by mouth every 4 (four) hours as needed for cough. 09/22/16  Yes Carmin Muskrat, MD  naphazoline-glycerin (CLEAR EYES) 0.012-0.2 % SOLN Place 1-2 drops into both eyes every 4 (four) hours as needed for irritation. Reported on 11/30/2015   Yes Historical Provider, MD  NON FORMULARY Take 1 capsule by mouth daily. Prostate supplement   Yes Historical Provider, MD  tamsulosin (FLOMAX) 0.4 MG CAPS capsule Take 0.4 mg by mouth. 10/28/15  Yes Historical Provider, MD  aspirin 81 MG chewable tablet Chew 1 tablet (81  mg total) by mouth daily. 11/27/16   Fredia Sorrow, MD  naproxen (NAPROSYN) 500 MG tablet Take 1 tablet (500 mg total) by mouth 2 (two) times daily. Patient not taking: Reported on 09/22/2016 01/23/14   Carlisle Cater, PA-C  pantoprazole (PROTONIX) 20 MG tablet Take 1 tablet (20 mg total) by mouth daily. Patient not taking: Reported on 09/22/2016 06/01/15   Mayer Camel, MD  predniSONE (DELTASONE) 20 MG tablet Take 2 tablets (40 mg total) by mouth daily with breakfast. For the next four days Patient not taking: Reported on 11/27/2016 09/22/16   Carmin Muskrat, MD  traMADol (ULTRAM) 50 MG tablet Take 1 tablet (50 mg total) by mouth every 6 (six) hours as needed. Patient not taking: Reported on 09/22/2016 01/23/14   Carlisle Cater, PA-C    Family History Family History  Problem Relation Age of Onset  . Heart disease Brother   . Heart disease Sister   . Colon cancer Neg Hx     Social History Social History  Substance Use Topics  . Smoking status: Former Research scientist (life sciences)  . Smokeless tobacco: Never Used     Comment: Quit in 1988  . Alcohol use No     Allergies   Patient has no known allergies.   Review of Systems Review of Systems  Constitutional: Negative for fever.  HENT: Negative for congestion.   Eyes: Negative for visual disturbance.  Respiratory: Negative for shortness of breath.   Cardiovascular: Positive for  chest pain. Negative for palpitations and leg swelling.  Gastrointestinal: Negative for abdominal pain, nausea and vomiting.  Genitourinary: Negative for dysuria.  Musculoskeletal: Negative for back pain.  Skin: Negative for rash.  Neurological: Negative for headaches.  Hematological: Does not bruise/bleed easily.  Psychiatric/Behavioral: Negative for confusion.     Physical Exam Updated Vital Signs BP (!) 131/95 (BP Location: Left Arm)   Pulse 75   Temp 98.3 F (36.8 C) (Oral)   Resp (!) 21   Ht 5\' 3"  (1.6 m)   Wt 49.9 kg   SpO2 97%   BMI 19.49 kg/m   Physical  Exam  Constitutional: He is oriented to person, place, and time. He appears well-developed and well-nourished. No distress.  HENT:  Head: Normocephalic and atraumatic.  Mouth/Throat: Oropharynx is clear and moist.  Eyes: EOM are normal. Pupils are equal, round, and reactive to light.  Neck: Normal range of motion. Neck supple.  Cardiovascular: Normal rate, regular rhythm and normal heart sounds.   Pulmonary/Chest: Effort normal and breath sounds normal. No respiratory distress.  Abdominal: Soft. Bowel sounds are normal. There is no tenderness.  Musculoskeletal: Normal range of motion. He exhibits no edema.  Neurological: He is alert and oriented to person, place, and time. No cranial nerve deficit or sensory deficit. He exhibits normal muscle tone. Coordination normal.  Skin: Skin is warm.  Nursing note and vitals reviewed.    ED Treatments / Results  Labs (all labs ordered are listed, but only abnormal results are displayed) Labs Reviewed  BASIC METABOLIC PANEL - Abnormal; Notable for the following:       Result Value   Glucose, Bld 114 (*)    Creatinine, Ser 1.48 (*)    GFR calc non Af Amer 48 (*)    GFR calc Af Amer 55 (*)    All other components within normal limits  CBC  I-STAT [facility], ED    EKG  EKG Interpretation  Date/Time:  Tuesday November 27 2016 12:12:34 EDT Ventricular Rate:  89 PR Interval:  132 QRS Duration: 90 QT Interval:  332 QTC Calculation: 403 R Axis:   -52 Text Interpretation:  Normal sinus rhythm Right atrial enlargement Pulmonary disease pattern Left anterior fascicular block Left ventricular hypertrophy T wave abnormality, consider lateral ischemia Abnormal ECG No significant change since last tracing Confirmed by Tanga Gloor  MD, Belkis Norbeck [phone]) on 11/27/2016 4:14:42 PM       Radiology Dg Chest 2 View  Result Date: 11/27/2016 CLINICAL DATA:  Chest pain. EXAM: CHEST  2 VIEW COMPARISON:  09/22/2016 FINDINGS: The heart size and mediastinal contours  are within normal limits. Both lungs are clear. The visualized skeletal structures are unremarkable. IMPRESSION: No active cardiopulmonary disease. Electronically Signed   By: Kathreen Devoid   On: 11/27/2016 12:49    Procedures Procedures (including critical care time)  Medications Ordered in ED Medications - No data to display   Initial Impression / Assessment and Plan / ED Course  I have reviewed the triage vital signs and the nursing notes.  Pertinent labs & imaging results that were available during my care of the patient were reviewed by me and considered in my medical decision making (see chart for details).     Patient with intermittent chest pain lasting only seconds for the past month. Patient denies any shortness of breath nausea or vomiting. Workup here EKG without acute changes. Troponin negative pains never lasted 20 minutes or longer. Patient has not followed up with cardiology for this.  We'll start baby aspirin a day and have him follow-up with cardiology. Chest x-ray negative.  Final Clinical Impressions(s) / ED Diagnoses   Final diagnoses:  Precordial pain    New Prescriptions New Prescriptions   ASPIRIN 81 MG CHEWABLE TABLET    Chew 1 tablet (81 mg total) by mouth daily.     Fredia Sorrow, MD 11/27/16 [phone]

## 2016-11-27 NOTE — Discharge Instructions (Signed)
Today's workup without any acute findings for the chest pain. Start taking a baby aspirin a day and make appointment to follow-up with cardiology. Return for any new or worse symptoms. Return for chest pain lasting 20 minutes or longer.

## 2016-12-27 ENCOUNTER — Emergency Department (HOSPITAL_COMMUNITY)
Admission: EM | Admit: 2016-12-27 | Discharge: 2016-12-27 | Disposition: A | Discharge status: Home / Self Care | Payer: Medicare Other | Attending: Physician Assistant | Admitting: Physician Assistant

## 2016-12-27 ENCOUNTER — Emergency Department (HOSPITAL_COMMUNITY): Payer: Medicare Other

## 2016-12-27 ENCOUNTER — Encounter (HOSPITAL_COMMUNITY): Payer: Self-pay | Admitting: Emergency Medicine

## 2016-12-27 DIAGNOSIS — I10 Essential (primary) hypertension: Secondary | ICD-10-CM | POA: Insufficient documentation

## 2016-12-27 DIAGNOSIS — J449 Chronic obstructive pulmonary disease, unspecified: Secondary | ICD-10-CM | POA: Insufficient documentation

## 2016-12-27 DIAGNOSIS — Z79899 Other long term (current) drug therapy: Secondary | ICD-10-CM | POA: Insufficient documentation

## 2016-12-27 DIAGNOSIS — Z7982 Long term (current) use of aspirin: Secondary | ICD-10-CM | POA: Diagnosis not present

## 2016-12-27 DIAGNOSIS — R0789 Other chest pain: Secondary | ICD-10-CM

## 2016-12-27 DIAGNOSIS — Z87891 Personal history of nicotine dependence: Secondary | ICD-10-CM | POA: Insufficient documentation

## 2016-12-27 DIAGNOSIS — R079 Chest pain, unspecified: Secondary | ICD-10-CM | POA: Diagnosis present

## 2016-12-27 LAB — BASIC METABOLIC PANEL
Anion gap: 6 (ref 5–15)
BUN: 7 mg/dL (ref 6–20)
CALCIUM: 9.5 mg/dL (ref 8.9–10.3)
CHLORIDE: 104 mmol/L (ref 101–111)
CO2: 28 mmol/L (ref 22–32)
CREATININE: 1.01 mg/dL (ref 0.61–1.24)
GFR calc Af Amer: >60 mL/min (ref >60)
Glucose, Bld: 86 mg/dL (ref 65–99)
Potassium: 4 mmol/L (ref 3.5–5.1)
Sodium: 138 mmol/L (ref 135–145)

## 2016-12-27 LAB — I-STAT TROPONIN, ED
TROPONIN I, POC: 0.01 ng/mL (ref 0.00–0.08)
TROPONIN I, POC: 0 ng/mL (ref 0.00–0.08)

## 2016-12-27 LAB — CBC
HCT: 41.7 % (ref 39.0–52.0)
Hemoglobin: 13.9 g/dL (ref 13.0–17.0)
MCH: 27.4 pg (ref 26.0–34.0)
MCHC: 33.3 g/dL (ref 30.0–36.0)
MCV: 82.2 fL (ref 78.0–100.0)
PLATELETS: 193 10*3/uL (ref 150–400)
RBC: 5.07 MIL/uL (ref 4.22–5.81)
RDW: 13 % (ref 11.5–15.5)
WBC: 4.9 10*3/uL (ref 4.0–10.5)

## 2016-12-27 NOTE — Discharge Instructions (Signed)
Read the information below.  You may return to the Emergency Department at any time for worsening condition or any new symptoms that concern you.   If you develop worsening chest pain, shortness of breath, fever, you pass out, or become weak or dizzy, return to the ER for a recheck.     It is very important that you follow up with your primary care provider and the heart specialist listed above.  Please call to make appointments.

## 2016-12-27 NOTE — ED Notes (Signed)
Pt ambulatory to restroom

## 2016-12-27 NOTE — ED Provider Notes (Signed)
[facility] DEPT Provider Note   CSN: [id number] Arrival date & time: 12/27/16  1322     History   Chief Complaint Chief Complaint  Patient presents with  . Chest Pain    HPI [name] is a 66 y.o. male.  HPI   Pt with hx HTN, COPD, GERD p/w intermittent chest pain that has been ongoing at least 1 month.  States he gets sharp, nonradiating left chest pain that occurs randomly, at all times of day, mostly at rest, lasts 1-2 seconds at a time.  The last episode was at noon today while he was sitting in his truck.  Was seen in the ED for this previously and referred to cardiology and started on aspirin.  He has been taking the aspirin but has not followed up with cardiology.   The symptoms cannot be brought in by anything, no exacerbating or palliative factors known.   Denies fever, SOB, N/V, sweats, leg swelling, lightheadedness/dizziness.  No known family hx CAD.  Denies recent immobilization, hx DVT.    Past Medical History:  Diagnosis Date  . Asthma   . CAD (coronary artery disease)   . COPD (chronic obstructive pulmonary disease) ([place])   . GERD (gastroesophageal reflux disease)   . Hypertension     Patient Active Problem List   Diagnosis Date Noted  . Elevated prostate specific antigen (PSA) 11/25/2015  . 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 HYPERTENSION 12/24/2007  . Essential (primary) hypertension 12/24/2007  . ASTHMA 12/23/2007  . C O P D 12/23/2007  . G E R D 12/23/2007  . Chronic obstructive pulmonary disease ([place]) 12/23/2007  . Acid reflux 12/23/2007    Past Surgical History:  Procedure Laterality Date  . COLONOSCOPY  2005   last colon  . POLYPECTOMY  2000       Home Medications    Prior to Admission medications   Medication Sig Start Date End Date Taking? Authorizing Provider  aspirin 81 MG chewable tablet Chew 1 tablet (81 mg total) by mouth daily. 11/27/16   Fredia Sorrow, MD  dextromethorphan-guaiFENesin  (ROBITUSSIN-DM) 10-100 MG/5ML liquid Take 5 mLs by mouth every 4 (four) hours as needed for cough. 09/22/16   Carmin Muskrat, MD  naphazoline-glycerin (CLEAR EYES) 0.012-0.2 % SOLN Place 1-2 drops into both eyes every 4 (four) hours as needed for irritation. Reported on 11/30/2015    Historical Provider, MD  naproxen (NAPROSYN) 500 MG tablet Take 1 tablet (500 mg total) by mouth 2 (two) times daily. Patient not taking: Reported on 09/22/2016 01/23/14   Carlisle Cater, PA-C  NON FORMULARY Take 1 capsule by mouth daily. Prostate supplement    Historical Provider, MD  pantoprazole (PROTONIX) 20 MG tablet Take 1 tablet (20 mg total) by mouth daily. Patient not taking: Reported on 09/22/2016 06/01/15   Mayer Camel, MD  predniSONE (DELTASONE) 20 MG tablet Take 2 tablets (40 mg total) by mouth daily with breakfast. For the next four days Patient not taking: Reported on 11/27/2016 09/22/16   Carmin Muskrat, MD  tamsulosin (FLOMAX) 0.4 MG CAPS capsule Take 0.4 mg by mouth. 10/28/15   Historical Provider, MD  traMADol (ULTRAM) 50 MG tablet Take 1 tablet (50 mg total) by mouth every 6 (six) hours as needed. Patient not taking: Reported on 09/22/2016 01/23/14   Carlisle Cater, PA-C    Family History Family History  Problem Relation Age of Onset  . Heart disease Brother   . Heart disease Sister   . Colon  cancer Neg Hx     Social History Social History  Substance Use Topics  . Smoking status: Former Research scientist (life sciences)  . Smokeless tobacco: Never Used     Comment: Quit in 1988  . Alcohol use No     Allergies   Patient has no known allergies.   Review of Systems Review of Systems  All other systems reviewed and are negative.    Physical Exam Updated Vital Signs BP (!) 155/96 (BP Location: Left Arm)   Pulse 73   Temp 97.6 F (36.4 C) (Oral)   Resp 20   SpO2 97%   Physical Exam  Constitutional: He appears well-developed and well-nourished. No distress.  HENT:  Head: Normocephalic and atraumatic.    Neck: Neck supple.  Cardiovascular: Normal rate, regular rhythm, normal heart sounds and intact distal pulses.   Pulmonary/Chest: Effort normal and breath sounds normal. No respiratory distress. He has no wheezes. He has no rales.  Abdominal: Soft. He exhibits no distension and no mass. There is no tenderness. There is no rebound and no guarding.  Musculoskeletal: He exhibits no edema.  Neurological: He is alert. He exhibits normal muscle tone.  Skin: He is not diaphoretic.  Nursing note and vitals reviewed.    ED Treatments / Results  Labs (all labs ordered are listed, but only abnormal results are displayed) Labs Reviewed  BASIC METABOLIC PANEL  CBC  I-STAT [place], ED  [facility], ED    EKG  EKG Interpretation None       Radiology Dg Chest 2 View  Result Date: 12/27/2016 CLINICAL DATA:  Chest pain. EXAM: CHEST  2 VIEW COMPARISON:  Radiographs of November 27, 2016. FINDINGS: The heart size and mediastinal contours are within normal limits. Both lungs are clear. No pneumothorax or pleural effusion is noted. The visualized skeletal structures are unremarkable. IMPRESSION: No active cardiopulmonary disease. Electronically Signed   By: Marijo Conception, M.D.   On: 12/27/2016 14:44    Procedures Procedures (including critical care time)  Medications Ordered in ED Medications - No data to display   Initial Impression / Assessment and Plan / ED Course  I have reviewed the triage vital signs and the nursing notes.  Pertinent labs & imaging results that were available during my care of the patient were reviewed by me and considered in my medical decision making (see chart for details).    Afebrile, nontoxic patient with atypical chest pain.  Troponin x 2 negative.  Workup reassuring.  Pt strongly advised to follow up with PCP (Dr Coletta Memos) and cardiology.  Pt had no recurrent CP while in the ED, no other symptoms.  HEART score is 3.   Doubt ACS, PE, PNA, dissection.   D/C  home.  Discussed result, findings, treatment, and follow up  with patient.  Pt given return precautions.  Pt verbalizes understanding and agrees with plan.       Final Clinical Impressions(s) / ED Diagnoses   Final diagnoses:  Atypical chest pain    New Prescriptions Discharge Medication List as of 12/27/2016  5:55 PM       Clayton Bibles, PA-C 12/27/16 [place], MD 12/28/16 [phone]

## 2016-12-27 NOTE — ED Triage Notes (Signed)
Pt sts left sided CP near rib area x 2 days

## 2016-12-27 NOTE — ED Notes (Signed)
Pt verbalized understanding discharge instructions and denies any further needs or questions at this time. VS stable, ambulatory and steady gait.   

## 2017-02-12 ENCOUNTER — Emergency Department (HOSPITAL_COMMUNITY): Payer: Medicare Other

## 2017-02-12 ENCOUNTER — Encounter (HOSPITAL_COMMUNITY): Payer: Self-pay | Admitting: Emergency Medicine

## 2017-02-12 ENCOUNTER — Emergency Department (HOSPITAL_COMMUNITY)
Admission: EM | Admit: 2017-02-12 | Discharge: 2017-02-12 | Disposition: A | Discharge status: Home / Self Care | Payer: Medicare Other | Attending: Emergency Medicine | Admitting: Emergency Medicine

## 2017-02-12 DIAGNOSIS — W182XXA Fall in (into) shower or empty bathtub, initial encounter: Secondary | ICD-10-CM | POA: Insufficient documentation

## 2017-02-12 DIAGNOSIS — R0789 Other chest pain: Secondary | ICD-10-CM | POA: Insufficient documentation

## 2017-02-12 DIAGNOSIS — I2510 Atherosclerotic heart disease of native coronary artery without angina pectoris: Secondary | ICD-10-CM | POA: Diagnosis not present

## 2017-02-12 DIAGNOSIS — Z87891 Personal history of nicotine dependence: Secondary | ICD-10-CM | POA: Insufficient documentation

## 2017-02-12 DIAGNOSIS — Y999 Unspecified external cause status: Secondary | ICD-10-CM | POA: Diagnosis not present

## 2017-02-12 DIAGNOSIS — Y929 Unspecified place or not applicable: Secondary | ICD-10-CM | POA: Insufficient documentation

## 2017-02-12 DIAGNOSIS — I10 Essential (primary) hypertension: Secondary | ICD-10-CM | POA: Diagnosis not present

## 2017-02-12 DIAGNOSIS — Y939 Activity, unspecified: Secondary | ICD-10-CM | POA: Diagnosis not present

## 2017-02-12 DIAGNOSIS — J449 Chronic obstructive pulmonary disease, unspecified: Secondary | ICD-10-CM | POA: Insufficient documentation

## 2017-02-12 DIAGNOSIS — S299XXA Unspecified injury of thorax, initial encounter: Secondary | ICD-10-CM | POA: Diagnosis present

## 2017-02-12 MED ORDER — LIDOCAINE 5 % EX PTCH: 1.0000 | MEDICATED_PATCH | CUTANEOUS | 0 refills | Status: DC

## 2017-02-12 MED ORDER — TRAMADOL HCL 50 MG PO TABS: 50.0000 mg | ORAL_TABLET | Freq: Four times a day (QID) | ORAL | 0 refills | Status: DC | PRN

## 2017-02-12 NOTE — ED Notes (Signed)
Pt educated on incentive spirometry. Teach back method used

## 2017-02-12 NOTE — ED Provider Notes (Signed)
Emergency Department Provider Note   I have reviewed the triage vital signs and the nursing notes.   HISTORY  Chief Complaint Fall and Flank Pain   HPI [name] is a 66 y.o. male with PMH of CAD, COPD, GERD, HTN, and asthma presents to the emergency room in for evaluation of left chest wall pain after mechanical fall in the shower 3 days ago. Patient states he slipped and fell landing on his left side. He had significant pain in the left lateral chest wall since the fall. Pain is worse with movement and especially with lying flat. He denies any abdominal pain. No head trauma or loss of consciousness. Patient is not anticoagulated. He has been taking over-the-counter Tylenol with little to no relief in symptoms. No fevers, chills, productive cough. No radiation of symptoms.   Past Medical History:  Diagnosis Date  . Asthma   . CAD (coronary artery disease)   . COPD (chronic obstructive pulmonary disease) ([place])   . GERD (gastroesophageal reflux disease)   . Hypertension     Patient Active Problem List   Diagnosis Date Noted  . Elevated prostate specific antigen (PSA) 11/25/2015  . 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 HYPERTENSION 12/24/2007  . Essential (primary) hypertension 12/24/2007  . ASTHMA 12/23/2007  . C O P D 12/23/2007  . G E R D 12/23/2007  . Chronic obstructive pulmonary disease ([place]) 12/23/2007  . Acid reflux 12/23/2007    Past Surgical History:  Procedure Laterality Date  . COLONOSCOPY  2005   last colon  . POLYPECTOMY  2000    Current Outpatient Rx  . Order #: [id number] Class: Historical Med  . Order #: [id number] Class: Print  . Order #: [id number] Class: Print  . Order #: [id number] Class: Historical Med  . Order #: [id number] Class: Historical Med  . Order #: [id number] Class: Print  . Order #: [id number] Class: Print  . Order #: [id number] Class: Print  . Order #: [id number] Class: Print  . Order #: [id number] Class: Print     Allergies Patient has no known allergies.  Family History  Problem Relation Age of Onset  . Heart disease Brother   . Heart disease Sister   . Colon cancer Neg Hx     Social History Social History  Substance Use Topics  . Smoking status: Former Research scientist (life sciences)  . Smokeless tobacco: Never Used     Comment: Quit in 1988  . Alcohol use No    Review of Systems  Constitutional: No fever/chills Eyes: No visual changes. ENT: No sore throat. Cardiovascular: Denies chest pain. Respiratory: Denies shortness of breath. Gastrointestinal: No abdominal pain.  No nausea, no vomiting.  No diarrhea.  No constipation. Genitourinary: Negative for dysuria. Musculoskeletal: Negative for back pain. Positive left chest wall pain.  Skin: Negative for rash. Neurological: Negative for headaches, focal weakness or numbness.  10-point ROS otherwise negative.  ____________________________________________   PHYSICAL EXAM:  VITAL SIGNS: ED Triage Vitals  Enc Vitals Group     BP 02/12/17 1040 (!) 159/95     Pulse Rate 02/12/17 1040 62     Resp 02/12/17 1040 15     Temp 02/12/17 1040 97.7 F (36.5 C)     Temp Source 02/12/17 1040 Oral     SpO2 02/12/17 1040 100 %     Weight 02/12/17 1047 112 lb (50.8 kg)     Height 02/12/17 1047 5\' 4"  (1.626 m)     Pain Score 02/12/17 1047 5   Constitutional: Alert and oriented. Well  appearing and in no acute distress. Eyes: Conjunctivae are normal.  Head: Atraumatic. Nose: No congestion/rhinnorhea. Mouth/Throat: Mucous membranes are moist.  Oropharynx non-erythematous. Neck: No stridor.  Cardiovascular: Normal rate, regular rhythm. Good peripheral circulation. Grossly normal heart sounds.   Respiratory: Normal respiratory effort.  No retractions. Lungs CTAB. Gastrointestinal: Soft and nontender. No distention.  Musculoskeletal: No lower extremity tenderness nor edema. No gross deformities of extremities. Positive focal tenderness to the left lateral and  posterior chest wall. No bruising, deformity, or paradoxical movement.  Neurologic:  Normal speech and language. No gross focal neurologic deficits are appreciated.  Skin:  Skin is warm, dry and intact. No rash noted.  ____________________________________________  RADIOLOGY  Dg Ribs Unilateral W/chest Left  Result Date: 02/12/2017 CLINICAL DATA:  66 year old who fell at home four days ago, striking the his left lower ribs on a bathtub. Persistent pain. Initial encounter. EXAM: LEFT RIBS AND CHEST - 3+ VIEW COMPARISON:  No prior rib imaging. Chest x-rays 12/27/2016 and earlier. FINDINGS: The site of maximum pain and tenderness was marked with a metallic BB. No fractures identified involving the left ribs. Mild costal cartilage calcification. Cardiomediastinal silhouette unremarkable, unchanged. Lungs clear. Bronchovascular markings normal. Pulmonary vascularity normal. No visible pleural effusions. No pneumothorax. IMPRESSION: 1. No left rib fractures identified. 2.  No acute cardiopulmonary disease. Electronically Signed   By: Evangeline Dakin M.D.   On: 02/12/2017 11:39    ____________________________________________   PROCEDURES  Procedure(s) performed:   Procedures  None ____________________________________________   INITIAL IMPRESSION / ASSESSMENT AND PLAN / ED COURSE  Pertinent labs & imaging results that were available during my care of the patient were reviewed by me and considered in my medical decision making (see chart for details).  Patient presents to the emergency department for evaluation of left flank/chest wall pain after mechanical fall in the shower 3 days ago. He has focal tenderness over the left lateral and posterior chest wall. No abdominal discomfort. Clinically suspect rib fractures on the side. No spinal tenderness to palpation of the midline. No exertional or pleuritic component to the pain to increase suspicion for ACS or PE. Plan for plain film of the left  ribs and chest.  11:55 AM No visible left rib fracture identified. No pneumothorax. Plan for Lidoderm patch and tramadol for breakthrough pain at home. Patient will continue treating with Tylenol. Provided incentive spirometer at discharge and instructed on how to use this. Discussed return precautions for pneumonia symptoms, worsening chest pain, abdominal discomfort, dysuria. In the setting of trauma have very low suspicion for infectious etiology of pain or ACS/PE as discussed above. Patient will follow with PCP.   At this time, I do not feel there is any life-threatening condition present. I have reviewed and discussed all results (EKG, imaging, lab, urine as appropriate), exam findings with patient. I have reviewed nursing notes and appropriate previous records.  I feel the patient is safe to be discharged home without further emergent workup. Discussed usual and customary return precautions. Patient and family (if present) verbalize understanding and are comfortable with this plan.  Patient will follow-up with their primary care provider. If they do not have a primary care provider, information for follow-up has been provided to them. All questions have been answered.  ____________________________________________  FINAL CLINICAL IMPRESSION(S) / ED DIAGNOSES  Final diagnoses:  Chest wall pain     MEDICATIONS GIVEN DURING THIS VISIT:  Medications - No data to display   NEW OUTPATIENT MEDICATIONS STARTED DURING THIS VISIT:  New Prescriptions   LIDOCAINE (LIDODERM) 5 %    Place 1 patch onto the skin daily. Remove & Discard patch within 12 hours or as directed by MD   TRAMADOL (ULTRAM) 50 MG TABLET    Take 1 tablet (50 mg total) by mouth every 6 (six) hours as needed.     Note:  This document was prepared using Dragon voice recognition software and may include unintentional dictation errors.  Nanda Quinton, MD Emergency Medicine    Naimah Yingst, [facility], MD 02/12/17 1200

## 2017-02-12 NOTE — ED Triage Notes (Signed)
Patient complaining of left flank pain from falling in the shower on Friday. Patient states that it has gotten worse. No bruising seen.

## 2017-02-12 NOTE — Discharge Instructions (Signed)

## 2017-05-16 ENCOUNTER — Emergency Department (HOSPITAL_COMMUNITY): Payer: Medicare Other

## 2017-05-16 ENCOUNTER — Encounter (HOSPITAL_COMMUNITY): Payer: Self-pay | Admitting: *Deleted

## 2017-05-16 ENCOUNTER — Emergency Department (HOSPITAL_COMMUNITY)
Admission: EM | Admit: 2017-05-16 | Discharge: 2017-05-16 | Disposition: A | Discharge status: Home / Self Care | Payer: Medicare Other | Attending: Emergency Medicine | Admitting: Emergency Medicine

## 2017-05-16 DIAGNOSIS — Y999 Unspecified external cause status: Secondary | ICD-10-CM | POA: Diagnosis not present

## 2017-05-16 DIAGNOSIS — I2510 Atherosclerotic heart disease of native coronary artery without angina pectoris: Secondary | ICD-10-CM | POA: Insufficient documentation

## 2017-05-16 DIAGNOSIS — Z79899 Other long term (current) drug therapy: Secondary | ICD-10-CM | POA: Diagnosis not present

## 2017-05-16 DIAGNOSIS — Z87891 Personal history of nicotine dependence: Secondary | ICD-10-CM | POA: Insufficient documentation

## 2017-05-16 DIAGNOSIS — J45909 Unspecified asthma, uncomplicated: Secondary | ICD-10-CM | POA: Diagnosis not present

## 2017-05-16 DIAGNOSIS — Y93H2 Activity, gardening and landscaping: Secondary | ICD-10-CM | POA: Insufficient documentation

## 2017-05-16 DIAGNOSIS — Y92007 Garden or yard of unspecified non-institutional (private) residence as the place of occurrence of the external cause: Secondary | ICD-10-CM | POA: Diagnosis not present

## 2017-05-16 DIAGNOSIS — J449 Chronic obstructive pulmonary disease, unspecified: Secondary | ICD-10-CM | POA: Diagnosis not present

## 2017-05-16 DIAGNOSIS — I10 Essential (primary) hypertension: Secondary | ICD-10-CM | POA: Diagnosis not present

## 2017-05-16 DIAGNOSIS — S81812A Laceration without foreign body, left lower leg, initial encounter: Secondary | ICD-10-CM | POA: Diagnosis present

## 2017-05-16 DIAGNOSIS — S8012XA Contusion of left lower leg, initial encounter: Secondary | ICD-10-CM | POA: Diagnosis not present

## 2017-05-16 DIAGNOSIS — W228XXA Striking against or struck by other objects, initial encounter: Secondary | ICD-10-CM | POA: Insufficient documentation

## 2017-05-16 DIAGNOSIS — Z7982 Long term (current) use of aspirin: Secondary | ICD-10-CM | POA: Insufficient documentation

## 2017-05-16 MED ORDER — CEPHALEXIN 500 MG PO CAPS: 500.0000 mg | ORAL_CAPSULE | Freq: Three times a day (TID) | ORAL | 0 refills | Status: DC

## 2017-05-16 NOTE — ED Notes (Signed)
See edp assessment 

## 2017-05-16 NOTE — ED Provider Notes (Signed)
[facility] DEPT Provider Note   CSN: [id number] Arrival date & time: 05/16/17  1559     History   Chief Complaint Chief Complaint  Patient presents with  . Laceration    HPI [name] is a 66 y.o. male.  The history is provided by the patient and medical records. No language interpreter was used.  Laceration     [name] is a 66 y.o. male  with a PMH of CAD, COPD, HTN who presents to the Emergency Department complaining of wound to inner left ankle that occurred just prior to arrival. Patient states that he was cutting the grass when he accidentally ran over a cable wire which came back and struck him on the ankle causing abrasion. Patient endorses pain and swelling to the area as well. He poured peroxide over the area prior to arrival. Last tetanus shot was last year. No numbness or tingling.   Past Medical History:  Diagnosis Date  . Asthma   . CAD (coronary artery disease)   . COPD (chronic obstructive pulmonary disease) ([place])   . GERD (gastroesophageal reflux disease)   . Hypertension     Patient Active Problem List   Diagnosis Date Noted  . Elevated prostate specific antigen (PSA) 11/25/2015  . 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 HYPERTENSION 12/24/2007  . Essential (primary) hypertension 12/24/2007  . ASTHMA 12/23/2007  . C O P D 12/23/2007  . G E R D 12/23/2007  . Chronic obstructive pulmonary disease ([place]) 12/23/2007  . Acid reflux 12/23/2007    Past Surgical History:  Procedure Laterality Date  . COLONOSCOPY  2005   last colon  . POLYPECTOMY  2000       Home Medications    Prior to Admission medications   Medication Sig Start Date End Date Taking? Authorizing Provider  acetaminophen (TYLENOL) 500 MG tablet Take 500 mg by mouth every 6 (six) hours as needed.    [provider]  aspirin 81 MG chewable tablet Chew 1 tablet (81 mg total) by mouth daily. 11/27/16   Fredia Sorrow, MD  cephALEXin  (KEFLEX) 500 MG capsule Take 1 capsule (500 mg total) by mouth 3 (three) times daily. 05/16/17   Atticus Wedin, Ozella Almond, PA-C  dextromethorphan-guaiFENesin (ROBITUSSIN-DM) 10-100 MG/5ML liquid Take 5 mLs by mouth every 4 (four) hours as needed for cough. 09/22/16   Carmin Muskrat, MD  lidocaine (LIDODERM) 5 % Place 1 patch onto the skin daily. Remove & Discard patch within 12 hours or as directed by MD 02/12/17   Long, Wonda Olds, MD  naphazoline-glycerin (CLEAR EYES) 0.012-0.2 % SOLN Place 1-2 drops into both eyes every 4 (four) hours as needed for irritation. Reported on 11/30/2015    [provider]  naproxen (NAPROSYN) 500 MG tablet Take 1 tablet (500 mg total) by mouth 2 (two) times daily. Patient not taking: Reported on 09/22/2016 01/23/14   Carlisle Cater, PA-C  NON FORMULARY Take 1 capsule by mouth daily. Prostate supplement    [provider]  pantoprazole (PROTONIX) 20 MG tablet Take 1 tablet (20 mg total) by mouth daily. Patient not taking: Reported on 09/22/2016 06/01/15   Mayer Camel, MD  predniSONE (DELTASONE) 20 MG tablet Take 2 tablets (40 mg total) by mouth daily with breakfast. For the next four days Patient not taking: Reported on 11/27/2016 09/22/16   Carmin Muskrat, MD  traMADol (ULTRAM) 50 MG tablet Take 1 tablet (50 mg total) by mouth every 6 (six) hours as needed. 02/12/17  Long, Wonda Olds, MD    Family History Family History  Problem Relation Age of Onset  . Heart disease Brother   . Heart disease Sister   . Colon cancer Neg Hx     Social History Social History  Substance Use Topics  . Smoking status: Former Research scientist (life sciences)  . Smokeless tobacco: Never Used     Comment: Quit in 1988  . Alcohol use No     Allergies   Patient has no known allergies.   Review of Systems Review of Systems  Musculoskeletal: Positive for myalgias.  Skin: Positive for wound.  Neurological: Negative for weakness and numbness.     Physical Exam Updated Vital Signs BP (!)  134/101 (BP Location: Left Arm)   Pulse 81   Temp 97.9 F (36.6 C) (Oral)   Resp 15   Ht 5\' 3"  (1.6 m)   Wt 49.9 kg (110 lb)   SpO2 98%   BMI 19.49 kg/m   Physical Exam  Constitutional: He appears well-developed and well-nourished. No distress.  HENT:  Head: Normocephalic and atraumatic.  Neck: Neck supple.  Cardiovascular: Normal rate, regular rhythm and normal heart sounds.   No murmur heard. Pulmonary/Chest: Effort normal and breath sounds normal. No respiratory distress. He has no wheezes. He has no rales.  Musculoskeletal:       Feet:  Neurological: He is alert.  LLE neurovascularly intact.  Skin: Skin is warm and dry.  Nursing note and vitals reviewed.    ED Treatments / Results  Labs (all labs ordered are listed, but only abnormal results are displayed) Labs Reviewed - No data to display  EKG  EKG Interpretation None       Radiology Dg Ankle Complete Left  Result Date: 05/16/2017 CLINICAL DATA:  Contusion to the medial ankle EXAM: LEFT ANKLE COMPLETE - 3+ VIEW COMPARISON:  None. FINDINGS: There is no evidence of fracture, dislocation, or joint effusion. There is no evidence of arthropathy or other focal bone abnormality. Soft tissues are unremarkable. IMPRESSION: Negative. Electronically Signed   By: Donavan Foil M.D.   On: 05/16/2017 18:23    Procedures Procedures (including critical care time)  Medications Ordered in ED Medications - No data to display   Initial Impression / Assessment and Plan / ED Course  I have reviewed the triage vital signs and the nursing notes.  Pertinent labs & imaging results that were available during my care of the patient were reviewed by me and considered in my medical decision making (see chart for details).    [name] is a 66 y.o. male who presents to ED for injury to medial lower left extremity. Patient was cutting the grass when he ran over a cable wire which hit him in the lower left leg causing very  superficial abrasion not requiring repair. Tetanus is up-to-date. The surrounding area is quite swollen and tender to the touch. X-ray obtained and negative. Symptomatic home care instructions discussed. Will place on Keflex for infection prevention. PCP follow up if symptoms persist. Reasons to return to ER discussed. All questions answered.   Patient discussed with Dr. Ellender Hose who agrees with treatment plan.    Final Clinical Impressions(s) / ED Diagnoses   Final diagnoses:  Contusion of left lower leg, initial encounter    New Prescriptions New Prescriptions   CEPHALEXIN (KEFLEX) 500 MG CAPSULE    Take 1 capsule (500 mg total) by mouth 3 (three) times daily.     Martie Muhlbauer, Ozella Almond, PA-C 05/16/17 [phone]  Duffy Bruce, MD 05/17/17 [phone]

## 2017-05-16 NOTE — ED Triage Notes (Signed)
Pt states that he was outside today when a piece of wire hit his leg. Pt has a 1 inch laceration to his left inner ankle. Bleeding controlled. minor swelling noted.

## 2017-05-16 NOTE — Discharge Instructions (Signed)
It was my pleasure taking care of you today!   Please take all of your antibiotics until finished! This is to prevent infection. You will also need to keep the area clean and dry.   Ice affected area for pain and swelling relief. Tylenol as needed for pain. Rest. Follow up with your primary care provider if symptoms are not improving in the next week.   Return to ER for new or worsening symptoms, any additional concerns.

## 2017-06-24 ENCOUNTER — Other Ambulatory Visit: Payer: Self-pay | Admitting: Urology

## 2017-06-24 DIAGNOSIS — R972 Elevated prostate specific antigen [PSA]: Secondary | ICD-10-CM

## 2017-07-22 ENCOUNTER — Encounter (HOSPITAL_BASED_OUTPATIENT_CLINIC_OR_DEPARTMENT_OTHER): Payer: Self-pay | Admitting: *Deleted

## 2017-07-22 ENCOUNTER — Other Ambulatory Visit: Payer: Self-pay

## 2017-07-22 NOTE — Progress Notes (Signed)
NPO AFTER MN W/ EXCEPTION LIQUIDS UNTIL 0630 (NO CREAM /MILK PRODUCTS).  ARRIVE AT 1030.  NEEDS ISTAT 8.  CURRENT EKG IN CHART AND Epic.  WILL DO FLEET ENEMA AM DOS.

## 2017-07-24 MED ORDER — GENTAMICIN SULFATE 40 MG/ML IJ SOLN
5.0000 mg/kg | INTRAVENOUS | Status: AC
  Administered 2017-07-25: 250 mg via INTRAVENOUS
  Filled 2017-07-24 (×2): qty 6.25

## 2017-07-25 ENCOUNTER — Ambulatory Visit (HOSPITAL_BASED_OUTPATIENT_CLINIC_OR_DEPARTMENT_OTHER): Payer: Medicare Other | Admitting: Anesthesiology

## 2017-07-25 ENCOUNTER — Encounter (HOSPITAL_BASED_OUTPATIENT_CLINIC_OR_DEPARTMENT_OTHER): Payer: Self-pay

## 2017-07-25 ENCOUNTER — Encounter (HOSPITAL_BASED_OUTPATIENT_CLINIC_OR_DEPARTMENT_OTHER)
Admission: RE | Disposition: A | Discharge status: Home / Self Care | Payer: Self-pay | Source: Ambulatory Visit | Attending: Urology

## 2017-07-25 ENCOUNTER — Ambulatory Visit (HOSPITAL_COMMUNITY)
Admission: RE | Admit: 2017-07-25 | Discharge: 2017-07-25 | Disposition: A | Discharge status: Home / Self Care | Payer: Medicare Other | Source: Ambulatory Visit | Attending: Urology | Admitting: Urology

## 2017-07-25 ENCOUNTER — Ambulatory Visit (HOSPITAL_BASED_OUTPATIENT_CLINIC_OR_DEPARTMENT_OTHER)
Admission: RE | Admit: 2017-07-25 | Discharge: 2017-07-25 | Disposition: A | Discharge status: Home / Self Care | Payer: Medicare Other | Source: Ambulatory Visit | Attending: Urology | Admitting: Urology

## 2017-07-25 DIAGNOSIS — Z79899 Other long term (current) drug therapy: Secondary | ICD-10-CM | POA: Insufficient documentation

## 2017-07-25 DIAGNOSIS — M50322 Other cervical disc degeneration at C5-C6 level: Secondary | ICD-10-CM | POA: Diagnosis not present

## 2017-07-25 DIAGNOSIS — K219 Gastro-esophageal reflux disease without esophagitis: Secondary | ICD-10-CM | POA: Diagnosis not present

## 2017-07-25 DIAGNOSIS — Z8249 Family history of ischemic heart disease and other diseases of the circulatory system: Secondary | ICD-10-CM | POA: Insufficient documentation

## 2017-07-25 DIAGNOSIS — I2510 Atherosclerotic heart disease of native coronary artery without angina pectoris: Secondary | ICD-10-CM | POA: Diagnosis not present

## 2017-07-25 DIAGNOSIS — C61 Malignant neoplasm of prostate: Secondary | ICD-10-CM | POA: Diagnosis not present

## 2017-07-25 DIAGNOSIS — Z7982 Long term (current) use of aspirin: Secondary | ICD-10-CM | POA: Insufficient documentation

## 2017-07-25 DIAGNOSIS — N400 Enlarged prostate without lower urinary tract symptoms: Secondary | ICD-10-CM

## 2017-07-25 DIAGNOSIS — I10 Essential (primary) hypertension: Secondary | ICD-10-CM | POA: Insufficient documentation

## 2017-07-25 DIAGNOSIS — R972 Elevated prostate specific antigen [PSA]: Secondary | ICD-10-CM

## 2017-07-25 DIAGNOSIS — Z87891 Personal history of nicotine dependence: Secondary | ICD-10-CM | POA: Insufficient documentation

## 2017-07-25 DIAGNOSIS — Z8719 Personal history of other diseases of the digestive system: Secondary | ICD-10-CM | POA: Insufficient documentation

## 2017-07-25 DIAGNOSIS — J449 Chronic obstructive pulmonary disease, unspecified: Secondary | ICD-10-CM | POA: Diagnosis not present

## 2017-07-25 HISTORY — DX: Personal history of other specified conditions: Z87.898

## 2017-07-25 HISTORY — DX: Other cervical disc degeneration, unspecified cervical region: M50.30

## 2017-07-25 HISTORY — DX: Benign prostatic hyperplasia with lower urinary tract symptoms: N40.1

## 2017-07-25 HISTORY — DX: Diverticulosis of large intestine without perforation or abscess without bleeding: K57.30

## 2017-07-25 HISTORY — DX: Presence of spectacles and contact lenses: Z97.3

## 2017-07-25 HISTORY — PX: PROSTATE BIOPSY: SHX241

## 2017-07-25 HISTORY — DX: Elevated prostate specific antigen (PSA): R97.20

## 2017-07-25 HISTORY — DX: Personal history of colonic polyps: Z86.010

## 2017-07-25 HISTORY — DX: Benign prostatic hyperplasia with lower urinary tract symptoms: N13.8

## 2017-07-25 HISTORY — DX: Personal history of other diseases of the circulatory system: Z86.79

## 2017-07-25 LAB — POCT I-STAT, CHEM 8
CHLORIDE: 100 mmol/L — AB (ref 101–111)
CREATININE: 0.9 mg/dL (ref 0.61–1.24)
Calcium, Ion: 1.25 mmol/L (ref 1.15–1.40)
Glucose, Bld: 87 mg/dL (ref 65–99)
HEMATOCRIT: 45 % (ref 39.0–52.0)
HEMOGLOBIN: 15.3 g/dL (ref 13.0–17.0)
POTASSIUM: 3.8 mmol/L (ref 3.5–5.1)
Sodium: 140 mmol/L (ref 135–145)
TCO2: 26 mmol/L (ref 22–32)

## 2017-07-25 SURGERY — BIOPSY, PROSTATE, RECTAL APPROACH, WITH US GUIDANCE
Anesthesia: General

## 2017-07-25 MED ORDER — LACTATED RINGERS IV SOLN
INTRAVENOUS | Status: DC
  Administered 2017-07-25: 11:00:00 via INTRAVENOUS
  Filled 2017-07-25: qty 1000

## 2017-07-25 MED ORDER — FLEET ENEMA 7-19 GM/118ML RE ENEM
1.0000 | ENEMA | Freq: Once | RECTAL | Status: DC
  Filled 2017-07-25: qty 1

## 2017-07-25 MED ORDER — MIDAZOLAM HCL 2 MG/2ML IJ SOLN
INTRAMUSCULAR | Status: DC | PRN
  Administered 2017-07-25: 1 mg via INTRAVENOUS

## 2017-07-25 MED ORDER — DEXAMETHASONE SODIUM PHOSPHATE 10 MG/ML IJ SOLN
INTRAMUSCULAR | Status: AC
  Filled 2017-07-25: qty 1

## 2017-07-25 MED ORDER — PROPOFOL 10 MG/ML IV BOLUS
INTRAVENOUS | Status: DC | PRN
Start: 2017-07-25 — End: 2017-07-25
  Administered 2017-07-25: 140 mg via INTRAVENOUS

## 2017-07-25 MED ORDER — FENTANYL CITRATE (PF) 100 MCG/2ML IJ SOLN
INTRAMUSCULAR | Status: DC | PRN
Start: 2017-07-25 — End: 2017-07-25
  Administered 2017-07-25: 50 ug via INTRAVENOUS

## 2017-07-25 MED ORDER — ONDANSETRON HCL 4 MG/2ML IJ SOLN
INTRAMUSCULAR | Status: AC
  Filled 2017-07-25: qty 2

## 2017-07-25 MED ORDER — FENTANYL CITRATE (PF) 100 MCG/2ML IJ SOLN
INTRAMUSCULAR | Status: AC
  Filled 2017-07-25: qty 2

## 2017-07-25 MED ORDER — ONDANSETRON HCL 4 MG/2ML IJ SOLN
INTRAMUSCULAR | Status: DC | PRN
  Administered 2017-07-25: 4 mg via INTRAVENOUS

## 2017-07-25 MED ORDER — LIDOCAINE 2% (20 MG/ML) 5 ML SYRINGE
INTRAMUSCULAR | Status: AC
  Filled 2017-07-25: qty 5

## 2017-07-25 MED ORDER — PHENYLEPHRINE 40 MCG/ML (10ML) SYRINGE FOR IV PUSH (FOR BLOOD PRESSURE SUPPORT)
PREFILLED_SYRINGE | INTRAVENOUS | Status: AC
  Filled 2017-07-25: qty 10

## 2017-07-25 MED ORDER — TRAMADOL HCL 50 MG PO TABS: 50.0000 mg | ORAL_TABLET | Freq: Four times a day (QID) | ORAL | 0 refills | Status: DC | PRN

## 2017-07-25 MED ORDER — PHENYLEPHRINE HCL 10 MG/ML IJ SOLN: INTRAMUSCULAR | Status: DC | PRN

## 2017-07-25 MED ORDER — LIDOCAINE 2% (20 MG/ML) 5 ML SYRINGE
INTRAMUSCULAR | Status: DC | PRN
  Administered 2017-07-25: 60 mg via INTRAVENOUS

## 2017-07-25 MED ORDER — DEXAMETHASONE SODIUM PHOSPHATE 10 MG/ML IJ SOLN
INTRAMUSCULAR | Status: DC | PRN
  Administered 2017-07-25: 10 mg via INTRAVENOUS

## 2017-07-25 MED ORDER — MIDAZOLAM HCL 2 MG/2ML IJ SOLN
INTRAMUSCULAR | Status: AC
  Filled 2017-07-25: qty 2

## 2017-07-25 MED ORDER — PHENYLEPHRINE 40 MCG/ML (10ML) SYRINGE FOR IV PUSH (FOR BLOOD PRESSURE SUPPORT)
PREFILLED_SYRINGE | INTRAVENOUS | Status: DC | PRN
  Administered 2017-07-25 (×2): 40 ug via INTRAVENOUS

## 2017-07-25 SURGICAL SUPPLY — 10 items
INST BIOPSY MAXCORE 18GX25 (NEEDLE) IMPLANT
INSTR BIOPSY MAXCORE 18GX20 (NEEDLE) IMPLANT
KIT RM TURNOVER CYSTO AR (KITS) ×3 IMPLANT
NDL SAFETY ECLIPSE 18X1.5 (NEEDLE) IMPLANT
NEEDLE HYPO 18GX1.5 SHARP (NEEDLE)
NEEDLE HYPO 22GX1.5 SAFETY (NEEDLE) IMPLANT
NEEDLE SPNL 22GX7 QUINCKE BK (NEEDLE) ×3 IMPLANT
SYR CONTROL 10ML LL (SYRINGE) IMPLANT
UNDERPAD 30X30 INCONTINENT (UNDERPADS AND DIAPERS) ×3 IMPLANT
WATER STERILE IRR 500ML POUR (IV SOLUTION) ×3 IMPLANT

## 2017-07-25 NOTE — Anesthesia Procedure Notes (Signed)
Procedure Name: LMA Insertion Date/Time: 07/25/2017 12:36 PM Performed by: Suan Halter, CRNA Pre-anesthesia Checklist: Patient identified, Emergency Drugs available, Suction available and Patient being monitored Patient Re-evaluated:Patient Re-evaluated prior to induction Oxygen Delivery Method: Circle system utilized Preoxygenation: Pre-oxygenation with 100% oxygen Induction Type: IV induction Ventilation: Mask ventilation without difficulty LMA: LMA inserted LMA Size: 4.0 Number of attempts: 1 Airway Equipment and Method: Bite block Placement Confirmation: positive ETCO2 Tube secured with: Tape Dental Injury: Teeth and Oropharynx as per pre-operative assessment

## 2017-07-25 NOTE — Anesthesia Postprocedure Evaluation (Signed)
Anesthesia Post Note  Patient: [name]  Procedure(s) Performed: BIOPSY TRANSRECTAL ULTRASONIC PROSTATE (TUBP) (N/A )     Patient location during evaluation: PACU Anesthesia Type: General Level of consciousness: awake and alert and oriented Pain management: pain level controlled Vital Signs Assessment: post-procedure vital signs reviewed and stable Respiratory status: spontaneous breathing, nonlabored ventilation and respiratory function stable Cardiovascular status: blood pressure returned to baseline and stable Postop Assessment: no apparent nausea or vomiting Anesthetic complications: no    Last Vitals:  Vitals:   07/25/17 1320 07/25/17 1345  BP:  (!) 145/99  Pulse: 73 72  Resp: 13 15  Temp:    SpO2: 100% 98%    Last Pain:  Vitals:   07/25/17 1038  TempSrc:   PainSc: 1                  Yomara Toothman A.

## 2017-07-25 NOTE — H&P (Signed)
[name] is an 66 y.o. male.    Chief Complaint: Pre-op Prostate Biopsy Under Sedation  HPI:   1 - Elevated / Rising PSA - No FHX prostate cancer  10/2015 PSA 15.7 at PCP office, ? UTI given bactrim course ==> 11/2015 PSA 16.2 on recheck, UA normal ==> 02/2016 DRE 45gm, no frank nodules BX recommended  09/2016 PSA 15.14 at PCP office; 11/2016 REFUSED office prostate biopsy and REFUSED OR biopsy.  05/2017 PSA 25 at PCP per report.     Today "[name]" is seen to proceed with prostate biopsy under sedation for elevated / rising PSA.   Past Medical History:  Diagnosis Date  . BPH with urinary obstruction   . CAD (coronary artery disease)    per cardiac cath 01-08-2003  non-obstructive mild cad w/ normal LVSF  . COPD (chronic obstructive pulmonary disease) ([place])    per pulmologist note in 2009  GOLD 1  . DDD (degenerative disc disease), cervical    C5-7, C7-T1  . Diverticulosis of colon   . Elevated PSA   . GERD (gastroesophageal reflux disease)   . History of adenomatous polyp of colon   . History of hypertension   . History of precordial chest pain    as of 07-22-2017  denies cardiac S&S  . Wears glasses     Past Surgical History:  Procedure Laterality Date  . CARDIAC CATHETERIZATION  01-08-2003   dr Lyndel Safe   mild non-obstructive CAD,  normal LVSF, ef 55% (positive myoview)  . COLONOSCOPY  last one 12-14-2015    Family History  Problem Relation Age of Onset  . Heart disease Brother   . Heart disease Sister   . Colon cancer Neg Hx    Social History:  reports that he quit smoking about 30 years ago. His smoking use included cigarettes. He quit after 20.00 years of use. He quit smokeless tobacco use about 30 years ago. His smokeless tobacco use included snuff. He reports that he does not drink alcohol or use drugs.  Allergies: No Known Allergies  No medications prior to admission.    No results found for this or any previous visit (from the past 48 hour(s)). No results  found.  Review of Systems  Constitutional: Negative.  Negative for chills and fever.  HENT: Negative.   Eyes: Negative.   Respiratory: Negative.   Cardiovascular: Negative.   Gastrointestinal: Negative.   Genitourinary: Negative.  Negative for flank pain and hematuria.  Musculoskeletal: Negative.   Skin: Negative.   Neurological: Negative.   Endo/Heme/Allergies: Negative.   Psychiatric/Behavioral: Negative.     Height 5\' 3"  (1.6 m), weight 49.4 kg (109 lb). Physical Exam  Constitutional: He appears well-developed.  HENT:  Head: Normocephalic.  Neck: Normal range of motion.  Cardiovascular: Normal rate.  Respiratory: Effort normal.  GI: Soft.  Genitourinary:  Genitourinary Comments: No CVAT  Musculoskeletal: Normal range of motion.  Neurological: He is alert.  Skin: Skin is warm.  Psychiatric: He has a normal mood and affect.     Assessment/Plan  Proceed as planned with prostate biopsy under sedation. Risks, benefits, alternatives, expected peri-biopsy course disucssed previously and reiterated today.   Alexis Frock, MD 07/25/2017, 7:17 AM

## 2017-07-25 NOTE — Discharge Instructions (Signed)
°  Post Anesthesia Home Care Instructions  Activity: Get plenty of rest for the remainder of the day. A responsible individual must stay with you for 24 hours following the procedure.  For the next 24 hours, DO NOT: -Drive a car -Paediatric nurse -Drink alcoholic beverages -Take any medication unless instructed by your physician -Make any legal decisions or sign important papers.  Meals: Start with liquid foods such as gelatin or soup. Progress to regular foods as tolerated. Avoid greasy, spicy, heavy foods. If nausea and/or vomiting occur, drink only clear liquids until the nausea and/or vomiting subsides. Call your physician if vomiting continues.  Special Instructions/Symptoms: Your throat may feel dry or sore from the anesthesia or the breathing tube placed in your throat during surgery. If this causes discomfort, gargle with warm salt water. The discomfort should disappear within 24 hours.  If you had a scopolamine patch placed behind your ear for the management of post- operative nausea and/or vomiting:  1. The medication in the patch is effective for 72 hours, after which it should be removed.  Wrap patch in a tissue and discard in the trash. Wash hands thoroughly with soap and water. 2. You may remove the patch earlier than 72 hours if you experience unpleasant side effects which may include dry mouth, dizziness or visual disturbances. 3. Avoid touching the patch. Wash your hands with soap and water after contact with the patch.      1 - You may have bloody urine, stool, and semen on / off for several days. This is normal.  2 - Call MD or go to ER for fever >102, severe pain / nausea / vomiting not relieved by medications, or acute change in medical status

## 2017-07-25 NOTE — Brief Op Note (Signed)
07/25/2017  12:50 PM  PATIENT:  [name]  66 y.o. male  PRE-OPERATIVE DIAGNOSIS:  RISING PROSTATIC SPECIFIC ANTIGEN  POST-OPERATIVE DIAGNOSIS:  RISING PROSTATIC SPECIFIC ANTIGEN  PROCEDURE:  Procedure(s): BIOPSY TRANSRECTAL ULTRASONIC PROSTATE (TUBP) (N/A)  SURGEON:  Surgeon(s) and Role:    * Alexis Frock, MD - Primary  PHYSICIAN ASSISTANT:   ASSISTANTS: none   ANESTHESIA:   general  EBL:  0 mL   BLOOD ADMINISTERED:none  DRAINS: none   LOCAL MEDICATIONS USED:  NONE  SPECIMEN:  Source of Specimen:  12 prostate cores  DISPOSITION OF SPECIMEN:  PATHOLOGY  COUNTS:  YES  TOURNIQUET:  * No tourniquets in log *  DICTATION: .Other Dictation: Dictation Number [id number]  PLAN OF CARE: Discharge to home after PACU  PATIENT DISPOSITION:  PACU - hemodynamically stable.   Delay start of Pharmacological VTE agent (>24hrs) due to surgical blood loss or risk of bleeding: yes

## 2017-07-25 NOTE — Anesthesia Preprocedure Evaluation (Addendum)
Anesthesia Evaluation  Patient identified by MRN, date of birth, ID band Patient awake    Reviewed: Allergy & Precautions, NPO status , Patient's Chart, lab work & pertinent test results  Airway Mallampati: II  TM Distance: >3 FB Neck ROM: Full    Dental  (+) Dental Advisory Given, Edentulous Lower, Edentulous Upper   Pulmonary COPD, former smoker,    Pulmonary exam normal breath sounds clear to auscultation       Cardiovascular hypertension, + CAD  Normal cardiovascular exam Rhythm:Regular Rate:Normal     Neuro/Psych negative neurological ROS     GI/Hepatic Neg liver ROS, GERD  Medicated,  Endo/Other  negative endocrine ROS  Renal/GU negative Renal ROS   BPH    Musculoskeletal negative musculoskeletal ROS (+)   Abdominal   Peds  Hematology negative hematology ROS (+)   Anesthesia Other Findings Day of surgery medications reviewed with the patient.  Reproductive/Obstetrics                            Anesthesia Physical Anesthesia Plan  ASA: III  Anesthesia Plan: General   Post-op Pain Management:    Induction: Intravenous  PONV Risk Score and Plan: 3 and Ondansetron and Dexamethasone  Airway Management Planned: LMA  Additional Equipment:   Intra-op Plan:   Post-operative Plan: Extubation in OR  Informed Consent: I have reviewed the patients History and Physical, chart, labs and discussed the procedure including the risks, benefits and alternatives for the proposed anesthesia with the patient or authorized representative who has indicated his/her understanding and acceptance.   Dental advisory given  Plan Discussed with: CRNA  Anesthesia Plan Comments: (Risks/benefits of general anesthesia discussed with patient including risk of damage to teeth, lips, gum, and tongue, nausea/vomiting, allergic reactions to medications, and the possibility of heart attack, stroke and  death.  All patient questions answered.  Patient wishes to proceed.)        Anesthesia Quick Evaluation

## 2017-07-25 NOTE — Transfer of Care (Signed)
Immediate Anesthesia Transfer of Care Note  Patient: [name]  Procedure(s) Performed: Procedure(s) (LRB): BIOPSY TRANSRECTAL ULTRASONIC PROSTATE (TUBP) (N/A)  Patient Location: PACU  Anesthesia Type: General  Level of Consciousness: awake, oriented, sedated and patient cooperative  Airway & Oxygen Therapy: Patient Spontanous Breathing and Patient connected to face mask oxygen  Post-op Assessment: Report given to PACU RN and Post -op Vital signs reviewed and stable  Post vital signs: Reviewed and stable  Complications: No apparent anesthesia complications  Last Vitals:  Vitals:   07/25/17 1000 07/25/17 1259  BP: (!) 171/86 (!) 114/53  Pulse: 83   Resp: 16 16  Temp: (!) 36.4 C 36.8 C  SpO2: 100%     Last Pain:  Vitals:   07/25/17 1038  TempSrc:   PainSc: 1       Patients Stated Pain Goal: 5 (07/25/17 1038)

## 2017-07-26 ENCOUNTER — Encounter (HOSPITAL_BASED_OUTPATIENT_CLINIC_OR_DEPARTMENT_OTHER): Payer: Self-pay | Admitting: Urology

## 2017-07-26 NOTE — Op Note (Signed)
NAMENIKOLUS, MARCZAK               ACCOUNT NO.:  [id number]  MEDICAL RECORD NO.:  [id number]  LOCATION:                                 FACILITY:  PHYSICIAN:  Alexis Frock, MD          DATE OF BIRTH:  DATE OF PROCEDURE:  07/25/2017                              OPERATIVE REPORT   DIAGNOSIS:  Rising PSA.  PROCEDURE:  Transrectal ultrasound-guided biopsy of the prostate.  ESTIMATED BLOOD LOSS:  Nil.  COMPLICATIONS:  None.  SPECIMEN:  Twelve prostate cores as follows:  Right lateral base, right lateral medial, right lateral apex, right mid base, right mid medial, right mid apex, left lateral base, left lateral mid, left lateral apex, left mid base, left mid mid, left mid apex, all for permanent pathology.  FINDINGS:  Approximately 68 mL volume prostate, no median lobe.  ANESTHESIA:  General.  INDICATION:  [name] is a 66 year old gentleman with several-year history of rising PSA.  He has refused additional workup including office biopsy times several.  He has been offered operative biopsy times several.  He now wishes to proceed with operative biopsy as his PSA continued to rise, it is now over 25.  Informed consent was obtained and placed in the medical record.  PROCEDURE IN DETAIL:  The patient being [name], was verified. Procedure being transrectal ultrasound-guided biopsy of the prostate was confirmed.  Procedure was carried out.  Time-out was performed. Intravenous antibiotics were administered.  General LMA anesthesia was introduced.  The patient was placed into a low lithotomy position.  No sterile field was necessary.  Transrectal ultrasound was introduced, properly lubricated.  Transrectal ultrasound of the prostate revealed a 68 mL volume gland without median lobe.  There was a normal zonal anatomy, no obvious hypoechoic lesions noted.  Attention was directed to biopsy, 12 cores were obtained as per standard template and noted above, set aside for  separate course for permanent pathology.  Following these maneuvers, there was excellent hemostasis.  No obvious expansile hematomas and procedure was terminated.  The patient tolerated the procedure well.  There were no immediate periprocedural complications. The patient was taken to the postanesthesia care unit in stable condition.    ______________________________ Alexis Frock, MD   ______________________________ Alexis Frock, MD    TM/MEDQ  D:  07/25/2017  T:  07/26/2017  Job:  [id number]

## 2017-07-29 ENCOUNTER — Other Ambulatory Visit: Payer: Self-pay | Admitting: Urology

## 2017-07-29 DIAGNOSIS — C61 Malignant neoplasm of prostate: Secondary | ICD-10-CM

## 2017-08-19 ENCOUNTER — Encounter (HOSPITAL_COMMUNITY): Payer: Medicare Other

## 2017-09-04 ENCOUNTER — Encounter (HOSPITAL_COMMUNITY)
Admission: RE | Admit: 2017-09-04 | Discharge: 2017-09-04 | Disposition: A | Discharge status: Home / Self Care | Payer: Medicare Other | Source: Ambulatory Visit | Attending: Urology | Admitting: Urology

## 2017-09-04 DIAGNOSIS — C61 Malignant neoplasm of prostate: Secondary | ICD-10-CM | POA: Diagnosis not present

## 2017-09-04 MED ORDER — TECHNETIUM TC 99M MEDRONATE IV KIT
21.8000 | PACK | Freq: Once | INTRAVENOUS | Status: AC | PRN
  Administered 2017-09-04: 21.8 via INTRAVENOUS

## 2017-10-07 ENCOUNTER — Encounter: Payer: Self-pay | Admitting: Radiation Oncology

## 2017-10-15 DIAGNOSIS — C61 Malignant neoplasm of prostate: Secondary | ICD-10-CM | POA: Insufficient documentation

## 2017-10-16 ENCOUNTER — Encounter: Payer: Self-pay | Admitting: General Practice

## 2017-10-16 ENCOUNTER — Encounter: Payer: Self-pay | Admitting: Radiation Oncology

## 2017-10-16 ENCOUNTER — Other Ambulatory Visit: Payer: Self-pay

## 2017-10-16 ENCOUNTER — Ambulatory Visit
Admission: RE | Admit: 2017-10-16 | Discharge: 2017-10-16 | Disposition: A | Discharge status: Home / Self Care | Payer: Medicare Other | Source: Ambulatory Visit | Attending: Radiation Oncology | Admitting: Radiation Oncology

## 2017-10-16 DIAGNOSIS — Z800 Family history of malignant neoplasm of digestive organs: Secondary | ICD-10-CM | POA: Insufficient documentation

## 2017-10-16 DIAGNOSIS — Z87891 Personal history of nicotine dependence: Secondary | ICD-10-CM | POA: Diagnosis not present

## 2017-10-16 DIAGNOSIS — Z79899 Other long term (current) drug therapy: Secondary | ICD-10-CM | POA: Diagnosis not present

## 2017-10-16 DIAGNOSIS — I10 Essential (primary) hypertension: Secondary | ICD-10-CM | POA: Diagnosis not present

## 2017-10-16 DIAGNOSIS — M50322 Other cervical disc degeneration at C5-C6 level: Secondary | ICD-10-CM | POA: Insufficient documentation

## 2017-10-16 DIAGNOSIS — Z8249 Family history of ischemic heart disease and other diseases of the circulatory system: Secondary | ICD-10-CM | POA: Diagnosis not present

## 2017-10-16 DIAGNOSIS — C61 Malignant neoplasm of prostate: Secondary | ICD-10-CM | POA: Diagnosis present

## 2017-10-16 DIAGNOSIS — K219 Gastro-esophageal reflux disease without esophagitis: Secondary | ICD-10-CM | POA: Insufficient documentation

## 2017-10-16 DIAGNOSIS — I2510 Atherosclerotic heart disease of native coronary artery without angina pectoris: Secondary | ICD-10-CM | POA: Diagnosis not present

## 2017-10-16 DIAGNOSIS — J449 Chronic obstructive pulmonary disease, unspecified: Secondary | ICD-10-CM | POA: Insufficient documentation

## 2017-10-16 HISTORY — DX: Malignant neoplasm of prostate: C61

## 2017-10-16 NOTE — Progress Notes (Signed)
GU Location of Tumor / Histology: prostatic adenocarcinoma  If Prostate Cancer, Gleason Score is (4 + 4) and PSA is (24.67) on 05/23/17. Prostate volume: 90 ml  [name] refused a prostate biopsy for years prior.  Biopsies of prostate (if applicable) revealed:  nosis 1. 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 Prostate, needle biopsy(ies), right mid lateral PROSTATIC ADENOCARCINOMA, GLEASON SCORE 3+3=6 INVOLVES 5% OF ONE CORE. 3. Prostate, needle biopsy(ies), right apex lateral BENIGN PROSTATIC TISSUE 4. Prostate, needle biopsy(ies), right base meidal PROSTATIC ADENOCARCINOMA, GLEASON SCORE 4+3=7 INVOLVES 70% OF ONE CORE (PATTERN 4= 70%). PERINEURAL INVASION IDENTIFIED 5. Prostate, needle biopsy(ies), right mid medial PROSTATIC ADENOCARCINOMA, GLEASON SCORE 3+3=6 INVOLVES 5% OF ONE CORE. 6. Prostate, needle biopsy(ies), right apex medial PROSTATIC ADENOCARCINOMA, GLEASON SCORE 3+4=7 INVOLVES 5% OF ONE CORE (PATTERN 4= 5%). 7. Prostate, needle biopsy(ies), left base lateral BENIGN FIBROMUSCULAR TISSUE 8. Prostate, needle biopsy(ies), left mid lateral BENIGN PROSTATIC TISSUE 9. Prostate, needle biopsy(ies), left apex lateral PROSTATIC ADENOCARCINOMA, GLEASON SCORE 4+4=8 (GRADE GROUP 4) INVOLVES 50% OF ONE CORE. 10. Prostate, needle biopsy(ies), left base medial HIGH GRADE PROSTATIC INTRAEPITHELIAL NEOPLASIA 11. Prostate, needle biopsy(ies), left mid medial PROSTATIC ADENOCARCINOMA, GLEASON SCORE 4+3=7 INVOLVES 30% OF ONE CORE (PATTERN 4= 90%). 12. Prostate, needle biopsy(ies), left apex medial PROSTATIC ADENOCARCINOMA, GLEASON SCORE 3+3=6 INVOLVES 20% OF ONE CORE. Past/Anticipated interventions by urology, if any: prostate biopsy, CT of abdomen pelvis (negative), PET (negative), strongly recommended prostatectomy due to large volume, referral to Dr. Tammi Klippel to discuss EXRT followed by two years of ADT  Past/Anticipated  interventions by medical oncology, if any: no  Weight changes, if any: no  Bowel/Bladder complaints, if any: IPSS 13. Denies dysuria or hematuria. Reports post void dribble.    Nausea/Vomiting, if any: no  Pain issues, if any:  no  SAFETY ISSUES:  Prior radiation? no  Pacemaker/ICD? no  Possible current pregnancy? no  Is the patient on methotrexate? no  Current Complaints / other details:  67 year old male. Widowed. Retired. Reports drinking coffee most all day long. Reports he is very stressed about his inability to have sex with his partner. Patient very concerned his partner of eight months will leave him. BP elevated. Will reach out to PCP, Dr. Bernerd Limbo, on patient's behalf to arrange a follow up appointment.

## 2017-10-16 NOTE — Progress Notes (Signed)
Radiation Oncology         [phone]) [phone] ________________________________  Initial outpatient Consultation  Name: [name] MRN: [id number]  Date: 10/16/2017  DOB: [birth year]  CC:Bernerd Limbo, MD  Alexis Frock, MD   REFERRING PHYSICIAN: Alexis Frock, MD  DIAGNOSIS: 67 year-old gentleman with Stage T1c adenocarcinoma of the prostate with Gleason Score of 4+4, and PSA of 24.67.   The encounter diagnosis was Malignant neoplasm of prostate ([place]).    ICD-10-CM   1. Malignant neoplasm of prostate ([place]) C61     HISTORY OF PRESENT ILLNESS: [name] is a 67 y.o. male with a diagnosis of prostate cancer. He was noted to have an elevated PSA of 15.7 by his primary care physician, Dr. Bernerd Limbo  in 10/2015.  He was treated with a course of antibiotics and a repeat PSA in March 2017 was further elevated at 16.2.  Accordingly, he was referred for evaluation in urology by Dr. Tresa Moore in 09/2016,  digital rectal examination was performed at that time revealing symmetrical lobes without prostate nodules.  A repeat PSA at that time was 15.14  and the recommendation was to proceed with prostate biopsy.  The patient refused office biopsy or biopsy under sedation at that time.  A repeat PSA with his primary care provider in September 2018 was further elevated at 25.  He was therefore referred back to urology for further assessment.  He had a follow-up visit with Dr. Tresa Moore on June 10, 2017 and at that time agreed to proceed with prostate biopsy under sedation.  The patient proceeded to transrectal ultrasound with 12 biopsies of the prostate under sedation on 07/25/2017.  The prostate volume measured 68 cc.  Out of 12 core biopsies, 7 were positive.  The maximum Gleason score was 4+4, and this was seen in the left apex lateral. Also seen was Gleason 4+3 in the right base medial and left mid medial, Gleason 3+4 in the right apex medial and Gleason 3+3 in the right mid lateral, right mid medial, and left  apex medial.  CT Abdomen/Pelvis on 08/26/2017 showed an enlarged and nodular prostate with no evidence of metastatic disease.  The CT scan suggested a prostate volume closer to 90 g.  A Bone scan on 09/04/2017 showed no evidence of bony metastatic disease.  The patient reviewed the biopsy results with his urologist and he has kindly been referred today for discussion of potential radiation treatment options.  PREVIOUS RADIATION THERAPY: No  PAST MEDICAL HISTORY:  Past Medical History:  Diagnosis Date  . BPH with urinary obstruction   . CAD (coronary artery disease)    per cardiac cath 01-08-2003  non-obstructive mild cad w/ normal LVSF  . COPD (chronic obstructive pulmonary disease) ([place])    per pulmologist note in 2009  GOLD 1  . DDD (degenerative disc disease), cervical    C5-7, C7-T1  . Diverticulosis of colon   . Elevated PSA   . GERD (gastroesophageal reflux disease)   . History of adenomatous polyp of colon   . History of hypertension   . History of precordial chest pain    as of 07-22-2017  denies cardiac S&S  . Prostate cancer ([place])   . Wears glasses       PAST SURGICAL HISTORY: Past Surgical History:  Procedure Laterality Date  . CARDIAC CATHETERIZATION  01-08-2003   dr Lyndel Safe   mild non-obstructive CAD,  normal LVSF, ef 55% (positive myoview)  . COLONOSCOPY  last one 12-14-2015  . PROSTATE  BIOPSY N/A 07/25/2017   Procedure: BIOPSY TRANSRECTAL ULTRASONIC PROSTATE (TUBP);  Surgeon: Alexis Frock, MD;  Location: [facility];  Service: Urology;  Laterality: N/A;    FAMILY HISTORY:  Family History  Problem Relation Age of Onset  . Heart disease Brother   . Colon cancer Brother   . Heart disease Sister   . Cancer Sister        unknown  . Cancer Mother        unknown    SOCIAL HISTORY:  Social History   Socioeconomic History  . Marital status: Widowed    Spouse name: Not on file  . Number of children: 2  . Years of education: Not on file    . Highest education level: Not on file  Social Needs  . Financial resource strain: Not on file  . Food insecurity - worry: Not on file  . Food insecurity - inability: Not on file  . Transportation needs - medical: Not on file  . Transportation needs - non-medical: Not on file  Occupational History  . Occupation: Custodian    Employer: [employer]  . Occupation: retired    Comment: 2015  Tobacco Use  . Smoking status: Former Smoker    Packs/day: 1.50    Years: 20.00    Pack years: 30.00    Types: Cigarettes    Last attempt to quit: 07/23/1987    Years since quitting: 30.2  . Smokeless tobacco: Former Systems developer    Types: Snuff    Quit date: 07/23/1987  Substance and Sexual Activity  . Alcohol use: No    Frequency: Never  . Drug use: No  . Sexual activity: Not Currently  Other Topics Concern  . Not on file  Social History Narrative  . Not on file    ALLERGIES: Patient has no known allergies.  MEDICATIONS:  Current Outpatient Medications  Medication Sig Dispense Refill  . Misc Natural Products (PROSTATE THERAPY COMPLEX) CAPS Take by mouth.    Marland Kitchen acetaminophen (TYLENOL) 500 MG tablet Take 500 mg by mouth every 6 (six) hours as needed.    Marland Kitchen aspirin 81 MG chewable tablet Chew 1 tablet (81 mg total) by mouth daily. (Patient not taking: Reported on 10/16/2017) 30 tablet 1  . atorvastatin (LIPITOR) 10 MG tablet Take 10 mg by mouth.    . Multiple Vitamin (MULTIVITAMIN) tablet Take 1 tablet daily by mouth.    . naphazoline-glycerin (CLEAR EYES) 0.012-0.2 % SOLN Place 1-2 drops into both eyes every 4 (four) hours as needed for irritation. Reported on 11/30/2015    . NON FORMULARY Take 1 capsule by mouth daily. Prostate supplement    . ranitidine (ZANTAC) 150 MG tablet Take 150 mg as needed by mouth for heartburn.    . sildenafil (REVATIO) 20 MG tablet   1  . traMADol (ULTRAM) 50 MG tablet Take 1 tablet (50 mg total) every 6 (six) hours as needed by mouth for moderate pain.  After procedure (Patient not taking: Reported on 10/16/2017) 10 tablet 0   No current facility-administered medications for this encounter.     REVIEW OF SYSTEMS:  On review of systems, the patient reports that he is doing well overall. He denies any chest pain, shortness of breath, cough, fevers, chills, night sweats, unintended weight changes. He denies any bowel disturbances, and denies abdominal pain, nausea or vomiting. He denies any new musculoskeletal or joint aches or pains. Reports drinking coffee most all day long. He denies swelling of  his feet or ankles. He used to use inhalers for asthma but hasn't needed to in years. He takes a baby aspirin PRN. His IPSS was 13, indicating moderate urinary symptoms. Reports post void dribble. Denies dysuria or hematuria. He is able to complete sexual activity with less than half of attempts. Reports he is very stressed about his inability to have sex with his partner. Patient very concerned his partner of eight months will leave him. A complete review of systems is obtained and is otherwise negative.    PHYSICAL EXAM:  Wt Readings from Last 3 Encounters:  10/16/17 116 lb 6.4 oz (52.8 kg)  07/25/17 110 lb (49.9 kg)  05/16/17 110 lb (49.9 kg)   Temp Readings from Last 3 Encounters:  10/16/17 98 F (36.7 C) (Oral)  07/25/17 98.5 F (36.9 C)  05/16/17 97.9 F (36.6 C) (Oral)   BP Readings from Last 3 Encounters:  10/16/17 (!) 169/111  07/25/17 132/88  05/16/17 134/77   Pulse Readings from Last 3 Encounters:  10/16/17 72  07/25/17 85  05/16/17 (!) 56   Pain Assessment Pain Score: 0-No pain/10  In general this is a well appearing african [place] male in no acute distress. His BP is elevated but he admits to being extremely anxious regarding this visit and his recent diagnosis. He is alert and oriented x4 and appropriate throughout the examination. HEENT reveals that the patient is normocephalic, atraumatic. EOMs are intact. PERRLA. Skin is  intact without any evidence of gross lesions. Cardiovascular exam reveals a regular rate and rhythm, no clicks rubs or murmurs are auscultated. Chest is clear to auscultation bilaterally. Lymphatic assessment is performed and does not reveal any adenopathy in the cervical, supraclavicular, axillary, or inguinal chains. Abdomen has active bowel sounds in all quadrants and is intact. The abdomen is soft, non tender, non distended. Lower extremities are negative for pretibial pitting edema, deep calf tenderness, cyanosis or clubbing.   KPS = 100  100 - Normal; no complaints; no evidence of disease. 90   - Able to carry on normal activity; minor signs or symptoms of disease. 80   - Normal activity with effort; some signs or symptoms of disease. 87   - Cares for self; unable to carry on normal activity or to do active work. 60   - Requires occasional assistance, but is able to care for most of his personal needs. 50   - Requires considerable assistance and frequent medical care. 40   - Disabled; requires special care and assistance. 80   - Severely disabled; hospital admission is indicated although death not imminent. 26   - Very sick; hospital admission necessary; active supportive treatment necessary. 10   - Moribund; fatal processes progressing rapidly. 0     - Dead  Karnofsky DA, Abelmann Beaver, Craver LS and [facility] [phone]) The use of the nitrogen mustards in the palliative treatment of carcinoma: with particular reference to bronchogenic carcinoma Cancer 1 [phone]  LABORATORY DATA:  Lab Results  Component Value Date   WBC 4.9 12/27/2016   HGB 15.3 07/25/2017   HCT 45.0 07/25/2017   MCV 82.2 12/27/2016   PLT 193 12/27/2016   Lab Results  Component Value Date   NA 140 07/25/2017   K 3.8 07/25/2017   CL 100 (L) 07/25/2017   CO2 28 12/27/2016   No results found for: ALT, AST, GGT, ALKPHOS, BILITOT   RADIOGRAPHY: No results found.    IMPRESSION/PLAN: 1. 67 y.o. gentleman with  Stage T1c  adenocarcinoma of the prostate with Gleason Score of 4+4, and PSA of 24.67. We discussed the patient's workup and outlined the nature of prostate cancer in this setting. The patient's T stage, Gleason's score, and PSA put him into the high risk group. Accordingly, he is eligible for a variety of potential treatment options including prostatectomy or LT- ADT in combination with either 8 weeks of external radiation or 5 weeks of external radiation followed by a brachytherapy boost. We discussed the available radiation techniques, and focused on the details and logistics and delivery. The patient may not be an ideal candidate for brachytherapy boost with a prostate volume between 68 - 90 cc prior to downsizing from hormone therapy. We discussed that based on his prostate volume, he would require beginning treatment with a 5 alpha reductase inhibitor and ADT for at least 3 months to allow for downsizing of the prostate prior to initiating radiotherapy. We would need to repeat a prostate volume study after 3 months of therapy to reassess the prostate volume at that time and confirm whether he is a candidate for brachytherapy.  We discussed and outlined the risks, benefits, short and long-term effects associated with radiotherapy and compared and contrasted these with prostatectomy. Based on his prostate volume and LUTS, he may be an ideal candidate for primary treatment with prostatectomy which would eliminate BOO sxs from enlarged prostate as well as potentially eliminate the prostate cancer.  We discussed the role of SpaceOAR in reducing the rectal toxicity associated with radiotherapy. We also detailed the role of ADT in the treatment of high risk prostate cancer and outlined the associated side effects that could be expected with this therapy.   At the conclusion of our conversation today, the patient is apprehensive about moving forward with treatment due to the risk for erectile dysfunction with any  treatment option.  We emphasized the importance of making a decision regarding treatment sooner than later to reduce the risk of prostate cancer extension outside of the prostate and the detrimental effects of advanced prostate cancer.  He met briefly with Edwyna Shell, LCSW and was given information about the prostate cancer support group and encouraged to call for any additional support needs and assistance in reaching a treatment decision.  We will also connect him with Cira Rue, prostate cancer nurse navigator, to assist with answering any additional questions and provide further guidance and support regarding treatment options.  He was provided with our contact information and advised to call at anytime with any questions or concerns so that we may be of assistance in helping him to reach a treatment decision as efficiently as possible.   Nicholos Johns, PA-C    Tyler Pita, MD  [facility] Direct Dial: [phone]  Fax: [phone] [email or web address]  Skype  LinkedIn  This document serves as a record of services personally performed by Tyler Pita, MD and Freeman Caldron, PA-C. It was created on their behalf by Arlyce Harman, a trained medical scribe. The creation of this record is based on the scribe's personal observations and the provider's statements to them. This document has been checked and approved by the attending provider.

## 2017-10-16 NOTE — Progress Notes (Signed)
See progress note under physician encounter. 

## 2017-10-16 NOTE — Progress Notes (Signed)
[facility] CSW Progress Note  CSW met w patient in clinic, discussed emotional reaction to recent diagnosis of cancer.  Psychoeducation re stress of receiving diagnosis and gathering information re treatment plan.  Assessed support available in community - has girlfriend, children, church and pastor who are all informed and supportive of patient.  No concerns re access to transportation at this time.  Provided information on resources available in the [place], provided flyer on Prostate Cancer Support Group, encouraged patient to participate. CSW will stand by to provide support and resources as needed, please contact as needed.  Edwyna Shell, LCSW Clinical Social Worker Phone:  [phone]

## 2017-10-17 ENCOUNTER — Telehealth: Payer: Self-pay | Admitting: Radiation Oncology

## 2017-10-17 NOTE — Telephone Encounter (Signed)
During consult yesterday patient seemed overwhelmed. In addition, patient's bp was elevated. Patient reported he hasn't seen his primary care provider in sometime. Offered to assist patient with arranging an appointment with PCP to manage elevated bp and refill of other medications.   Phoned Dr. Chales Abrahams office at [phone]. Receptionist reports Bouska has left and gone to [facility] but, Dr. Precious Haws has taken over his patients. Arranged an appointment for the patient to be seen by Dr. Precious Haws on Monday, February 25 at 2:40 pm.  Phoned patient to inform him of this appointment. Patient reports he is riding in his truck and has nothing to write on. Phoned patient back immediately and left voicemail message with details as requested.  Dr. Precious Haws [facility]

## 2017-10-22 ENCOUNTER — Telehealth: Payer: Self-pay | Admitting: Medical Oncology

## 2017-10-22 NOTE — Telephone Encounter (Signed)
Attempted to reach patient to follow up post radiation consult. There is no answering machine will try later.

## 2017-10-28 ENCOUNTER — Telehealth: Payer: Self-pay | Admitting: Medical Oncology

## 2017-10-28 NOTE — Telephone Encounter (Signed)
Introduced myself to [name] as the prostate nurse navigator and my role. I was unable to meet him the day he consulted with Dr. Tammi Klippel. He shared his feelings of being overwhelmed with the diagnosis of cancer and now having to make a treatment decision. Currently he is not interested in surgery but leaning towards ADT and radiation. We discussed the importance of moving forward with treatment to reduce the changes of spread. He voiced understanding. I discussed the prostate support group and encouraged him to come and meet men that have been in the same place he is and have completed  a variety of treatments. He voiced  Interest and he is aware that we meet to night. He has my card and numbers and will call me with questions and or concerns.

## 2017-11-14 ENCOUNTER — Encounter (HOSPITAL_COMMUNITY): Payer: Self-pay

## 2017-11-14 ENCOUNTER — Emergency Department (HOSPITAL_COMMUNITY): Payer: Medicare Other

## 2017-11-14 ENCOUNTER — Other Ambulatory Visit: Payer: Self-pay

## 2017-11-14 ENCOUNTER — Emergency Department (HOSPITAL_COMMUNITY)
Admission: EM | Admit: 2017-11-14 | Discharge: 2017-11-14 | Disposition: A | Discharge status: Home / Self Care | Payer: Medicare Other | Attending: Emergency Medicine | Admitting: Emergency Medicine

## 2017-11-14 DIAGNOSIS — I10 Essential (primary) hypertension: Secondary | ICD-10-CM | POA: Insufficient documentation

## 2017-11-14 DIAGNOSIS — B9789 Other viral agents as the cause of diseases classified elsewhere: Secondary | ICD-10-CM

## 2017-11-14 DIAGNOSIS — J069 Acute upper respiratory infection, unspecified: Secondary | ICD-10-CM | POA: Diagnosis not present

## 2017-11-14 DIAGNOSIS — M25519 Pain in unspecified shoulder: Secondary | ICD-10-CM | POA: Diagnosis not present

## 2017-11-14 DIAGNOSIS — B349 Viral infection, unspecified: Secondary | ICD-10-CM | POA: Insufficient documentation

## 2017-11-14 DIAGNOSIS — R05 Cough: Secondary | ICD-10-CM | POA: Diagnosis present

## 2017-11-14 DIAGNOSIS — Z79899 Other long term (current) drug therapy: Secondary | ICD-10-CM | POA: Insufficient documentation

## 2017-11-14 DIAGNOSIS — J449 Chronic obstructive pulmonary disease, unspecified: Secondary | ICD-10-CM | POA: Diagnosis not present

## 2017-11-14 DIAGNOSIS — I259 Chronic ischemic heart disease, unspecified: Secondary | ICD-10-CM | POA: Insufficient documentation

## 2017-11-14 DIAGNOSIS — Z87891 Personal history of nicotine dependence: Secondary | ICD-10-CM | POA: Diagnosis not present

## 2017-11-14 LAB — I-STAT TROPONIN, ED: TROPONIN I, POC: 0.01 ng/mL (ref 0.00–0.08)

## 2017-11-14 LAB — CBC
HCT: 37.3 % — ABNORMAL LOW (ref 39.0–52.0)
HEMOGLOBIN: 12.6 g/dL — AB (ref 13.0–17.0)
MCH: 28.4 pg (ref 26.0–34.0)
MCHC: 33.8 g/dL (ref 30.0–36.0)
MCV: 84 fL (ref 78.0–100.0)
Platelets: 299 10*3/uL (ref 150–400)
RBC: 4.44 MIL/uL (ref 4.22–5.81)
RDW: 12.2 % (ref 11.5–15.5)
WBC: 7.5 10*3/uL (ref 4.0–10.5)

## 2017-11-14 LAB — BASIC METABOLIC PANEL
ANION GAP: 9 (ref 5–15)
BUN: <5 mg/dL — ABNORMAL LOW (ref 6–20)
CALCIUM: 9.2 mg/dL (ref 8.9–10.3)
CO2: 27 mmol/L (ref 22–32)
CREATININE: 0.91 mg/dL (ref 0.61–1.24)
Chloride: 100 mmol/L — ABNORMAL LOW (ref 101–111)
GLUCOSE: 103 mg/dL — AB (ref 65–99)
Potassium: 4 mmol/L (ref 3.5–5.1)
Sodium: 136 mmol/L (ref 135–145)

## 2017-11-14 MED ORDER — DM-GUAIFENESIN ER 30-600 MG PO TB12: 1.0000 | ORAL_TABLET | Freq: Two times a day (BID) | ORAL | 0 refills | Status: DC

## 2017-11-14 MED ORDER — ALBUTEROL SULFATE HFA 108 (90 BASE) MCG/ACT IN AERS
2.0000 | INHALATION_SPRAY | Freq: Once | RESPIRATORY_TRACT | Status: AC
  Administered 2017-11-14: 2 via RESPIRATORY_TRACT
  Filled 2017-11-14: qty 6.7

## 2017-11-14 MED ORDER — BENZONATATE 100 MG PO CAPS: 100.0000 mg | ORAL_CAPSULE | Freq: Three times a day (TID) | ORAL | 0 refills | Status: DC

## 2017-11-14 MED ORDER — DEXAMETHASONE SODIUM PHOSPHATE 10 MG/ML IJ SOLN
10.0000 mg | Freq: Once | INTRAMUSCULAR | Status: AC
  Administered 2017-11-14: 10 mg via INTRAMUSCULAR
  Filled 2017-11-14: qty 1

## 2017-11-14 NOTE — Discharge Instructions (Signed)
We think what you have is a viral syndrome - the treatment for which is symptomatic relief only, and your body will fight the infection off in a few days. We are prescribing you some meds. See your primary care doctor in 1 week if the symptoms dont improve.

## 2017-11-14 NOTE — ED Notes (Signed)
D/c reviewed with patient w/teach back

## 2017-11-14 NOTE — ED Triage Notes (Signed)
Pt states he has had cough X1 week. States he has pain in his arm and back when coughing. Pt also reports chest pain with coughing. No distress noted. SKin warm and dry.

## 2017-11-14 NOTE — ED Provider Notes (Signed)
[facility] EMERGENCY DEPARTMENT Provider Note   CSN: [id number] Arrival date & time: 11/14/17  1257     History   Chief Complaint Chief Complaint  Patient presents with  . Cough  . Shoulder Pain    HPI [name] is a 67 y.o. male.  HPI 67 y/o with cc of cough and shoulder pain. PMHx of CAD, COPD, prostate CA. Cough x  Week, persistent, clear phlegm when productive. Chest pain with cough. No DIB or wheezing. + bilateral shoulder pain with cough, but no other body aches, fevers, chills. Pt has no hx of PE, DVT and denies any exogenous hormone (testosterone / estrogen) use, long distance travels or surgery in the past 6 weeks, active cancer, recent immobilization.   Past Medical History:  Diagnosis Date  . BPH with urinary obstruction   . CAD (coronary artery disease)    per cardiac cath 01-08-2003  non-obstructive mild cad w/ normal LVSF  . COPD (chronic obstructive pulmonary disease) ([place])    per pulmologist note in 2009  GOLD 1  . DDD (degenerative disc disease), cervical    C5-7, C7-T1  . Diverticulosis of colon   . Elevated PSA   . GERD (gastroesophageal reflux disease)   . History of adenomatous polyp of colon   . History of hypertension   . History of precordial chest pain    as of 07-22-2017  denies cardiac S&S  . Prostate cancer ([place])   . Wears glasses     Patient Active Problem List   Diagnosis Date Noted  . Malignant neoplasm of prostate ([place]) 10/15/2017  . Elevated prostate specific antigen (PSA) 11/25/2015  . 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 HYPERTENSION 12/24/2007  . Essential (primary) hypertension 12/24/2007  . ASTHMA 12/23/2007  . C O P D 12/23/2007  . G E R D 12/23/2007  . Chronic obstructive pulmonary disease ([place]) 12/23/2007  . Acid reflux 12/23/2007    Past Surgical History:  Procedure Laterality Date  . CARDIAC CATHETERIZATION  01-08-2003   dr Lyndel Safe   mild non-obstructive CAD,   normal LVSF, ef 55% (positive myoview)  . COLONOSCOPY  last one 12-14-2015  . PROSTATE BIOPSY N/A 07/25/2017   Procedure: BIOPSY TRANSRECTAL ULTRASONIC PROSTATE (TUBP);  Surgeon: Alexis Frock, MD;  Location: [facility];  Service: Urology;  Laterality: N/A;       Home Medications    Prior to Admission medications   Medication Sig Start Date End Date Taking? Authorizing Provider  acetaminophen (TYLENOL) 500 MG tablet Take 500 mg by mouth every 6 (six) hours as needed.    [provider]  aspirin 81 MG chewable tablet Chew 1 tablet (81 mg total) by mouth daily. Patient not taking: Reported on 10/16/2017 11/27/16   Fredia Sorrow, MD  atorvastatin (LIPITOR) 10 MG tablet Take 10 mg by mouth. 08/03/16   [provider]  benzonatate (TESSALON) 100 MG capsule Take 1 capsule (100 mg total) by mouth every 8 (eight) hours. 11/14/17   Varney Biles, MD  dextromethorphan-guaiFENesin (MUCINEX DM) 30-600 MG 12hr tablet Take 1 tablet by mouth 2 (two) times daily. 11/14/17   Varney Biles, MD  Misc Natural Products (PROSTATE THERAPY COMPLEX) CAPS Take by mouth.    [provider]  Multiple Vitamin (MULTIVITAMIN) tablet Take 1 tablet daily by mouth.    [provider]  naphazoline-glycerin (CLEAR EYES) 0.012-0.2 % SOLN Place 1-2 drops into both eyes every 4 (four) hours as needed for irritation.  Reported on 11/30/2015    [provider]  NON FORMULARY Take 1 capsule by mouth daily. Prostate supplement    [provider]  ranitidine (ZANTAC) 150 MG tablet Take 150 mg as needed by mouth for heartburn.    [provider]  sildenafil (REVATIO) 20 MG tablet  09/24/17   [provider]  traMADol (ULTRAM) 50 MG tablet Take 1 tablet (50 mg total) every 6 (six) hours as needed by mouth for moderate pain. After procedure Patient not taking: Reported on 10/16/2017 07/25/17 07/25/18  Alexis Frock, MD    Family History Family  History  Problem Relation Age of Onset  . Heart disease Brother   . Colon cancer Brother   . Heart disease Sister   . Cancer Sister        unknown  . Cancer Mother        unknown    Social History Social History   Tobacco Use  . Smoking status: Former Smoker    Packs/day: 1.50    Years: 20.00    Pack years: 30.00    Types: Cigarettes    Last attempt to quit: 07/23/1987    Years since quitting: 30.3  . Smokeless tobacco: Former Systems developer    Types: Snuff    Quit date: 07/23/1987  Substance Use Topics  . Alcohol use: No    Frequency: Never  . Drug use: No     Allergies   Patient has no known allergies.   Review of Systems Review of Systems  Constitutional: Positive for activity change.  Respiratory: Positive for cough.   Cardiovascular: Positive for chest pain.  Skin: Positive for rash.  Allergic/Immunologic: Negative for immunocompromised state.  Hematological: Does not bruise/bleed easily.  All other systems reviewed and are negative.    Physical Exam Updated Vital Signs BP (!) 152/99 (BP Location: Right Arm)   Pulse 82   Temp 98.6 F (37 C) (Oral)   Resp 14   SpO2 99%   Physical Exam  Constitutional: He is oriented to person, place, and time. He appears well-developed.  HENT:  Head: Atraumatic.  Neck: Neck supple.  Cardiovascular: Normal rate.  Pulmonary/Chest: Effort normal.  Neurological: He is alert and oriented to person, place, and time.  Skin: Skin is warm.  Nursing note and vitals reviewed.    ED Treatments / Results  Labs (all labs ordered are listed, but only abnormal results are displayed) Labs Reviewed  BASIC METABOLIC PANEL - Abnormal; Notable for the following components:      Result Value   Chloride 100 (*)    Glucose, Bld 103 (*)    BUN <5 (*)    All other components within normal limits  CBC - Abnormal; Notable for the following components:   Hemoglobin 12.6 (*)    HCT 37.3 (*)    All other components within normal limits    I-STAT TROPONIN, ED    EKG  EKG Interpretation  Date/Time:  Thursday November 14 2017 13:18:26 EST Ventricular Rate:  87 PR Interval:  130 QRS Duration: 88 QT Interval:  362 QTC Calculation: 435 R Axis:   -55 Text Interpretation:  Normal sinus rhythm Biatrial enlargement Left anterior fascicular block Nonspecific T wave abnormality Abnormal ECG No acute changes Nonspecific ST and T wave abnormality Confirmed by Varney Biles [phone]) on 11/14/2017 3:48:48 PM       Radiology Dg Chest 2 View  Result Date: 11/14/2017 CLINICAL DATA:  Dry painful cough for 1 week EXAM: CHEST -  2 VIEW COMPARISON:  02/12/2017 FINDINGS: Normal heart size, mediastinal contours, and pulmonary vascularity. Lungs appear slightly hyperinflated with mild central peribronchial thickening. No acute infiltrate, pleural effusion or pneumothorax. Bones demineralized. IMPRESSION: Chronic peribronchial thickening and slight hyperinflation which could represent COPD or asthma. No acute infiltrate. Electronically Signed   By: Lavonia Dana M.D.   On: 11/14/2017 13:47    Procedures Procedures (including critical care time)  Medications Ordered in ED Medications  albuterol (PROVENTIL HFA;VENTOLIN HFA) 108 (90 Base) MCG/ACT inhaler 2 puff (not administered)  dexamethasone (DECADRON) injection 10 mg (not administered)     Initial Impression / Assessment and Plan / ED Course  I have reviewed the triage vital signs and the nursing notes.  Pertinent labs & imaging results that were available during my care of the patient were reviewed by me and considered in my medical decision making (see chart for details).     67 year old comes in with chief complaint of cough for 1 week.  Patient is also having shoulder pain and chest pain with his cough.  Patient denies any fevers, chills and the phlegm is clear.  Patient is not a smoker.  History of COPD.  Patient has not been wheezing and not using any inhaler.  On exam lungs are  clear.  Chest x-ray shows viral bronchitis.  Patient will be treated with IM Decadron here along with albuterol.  Strict ER return precautions have been discussed.  Final Clinical Impressions(s) / ED Diagnoses   Final diagnoses:  Viral URI with cough    ED Discharge Orders        Ordered    benzonatate (TESSALON) 100 MG capsule  Every 8 hours     11/14/17 1704    dextromethorphan-guaiFENesin (MUCINEX DM) 30-600 MG 12hr tablet  2 times daily     11/14/17 1704       Varney Biles, MD 11/14/17 1706

## 2017-11-20 ENCOUNTER — Other Ambulatory Visit: Payer: Self-pay | Admitting: Urology

## 2017-11-26 ENCOUNTER — Encounter: Payer: Self-pay | Admitting: General Practice

## 2017-11-26 NOTE — Progress Notes (Signed)
[facility]ual Care Note  Reached [name] by phone per referral from Ashlyn Bruning/PA. Per pt, he is feeling ok right now (less distress), but was disappointed to miss most recent Prostate Cancer Support Group because he misplaced the flyer. Will mail him flyer, plus my card and brochure, tomorrow. Also referring to Prostate Navigator Robin Bass/RN for f/u call per his request.   Chaplain Shelva Majestic, [facility] Pager [phone] Voicemail [phone]

## 2017-11-27 ENCOUNTER — Encounter: Payer: Self-pay | Admitting: General Practice

## 2017-11-27 NOTE — Progress Notes (Signed)
[facility] Note  Mailed [place] information packet with handwritten note of encouragement.   [employer], [place], [facility] Pager [phone] Voicemail [phone]

## 2017-11-28 ENCOUNTER — Telehealth: Payer: Self-pay | Admitting: Medical Oncology

## 2017-11-28 NOTE — Telephone Encounter (Signed)
Left a message asking for a return call. Lorrin Jackson spoke with him yesterday and he was quite nervous about upcoming treatment for his prostate cancer. She asked me to reach out to him.

## 2017-11-29 ENCOUNTER — Other Ambulatory Visit: Payer: Self-pay

## 2017-11-29 ENCOUNTER — Emergency Department (HOSPITAL_COMMUNITY)
Admission: EM | Admit: 2017-11-29 | Discharge: 2017-11-29 | Disposition: A | Discharge status: Home / Self Care | Payer: Medicare Other | Attending: Emergency Medicine | Admitting: Emergency Medicine

## 2017-11-29 ENCOUNTER — Emergency Department (HOSPITAL_COMMUNITY): Payer: Medicare Other

## 2017-11-29 ENCOUNTER — Encounter (HOSPITAL_COMMUNITY): Payer: Self-pay | Admitting: *Deleted

## 2017-11-29 DIAGNOSIS — Z79899 Other long term (current) drug therapy: Secondary | ICD-10-CM | POA: Insufficient documentation

## 2017-11-29 DIAGNOSIS — M542 Cervicalgia: Secondary | ICD-10-CM | POA: Insufficient documentation

## 2017-11-29 DIAGNOSIS — I2510 Atherosclerotic heart disease of native coronary artery without angina pectoris: Secondary | ICD-10-CM | POA: Insufficient documentation

## 2017-11-29 DIAGNOSIS — I10 Essential (primary) hypertension: Secondary | ICD-10-CM | POA: Diagnosis not present

## 2017-11-29 DIAGNOSIS — R51 Headache: Secondary | ICD-10-CM | POA: Diagnosis not present

## 2017-11-29 DIAGNOSIS — Z8546 Personal history of malignant neoplasm of prostate: Secondary | ICD-10-CM | POA: Insufficient documentation

## 2017-11-29 DIAGNOSIS — Z87891 Personal history of nicotine dependence: Secondary | ICD-10-CM | POA: Insufficient documentation

## 2017-11-29 DIAGNOSIS — J449 Chronic obstructive pulmonary disease, unspecified: Secondary | ICD-10-CM | POA: Insufficient documentation

## 2017-11-29 DIAGNOSIS — R519 Headache, unspecified: Secondary | ICD-10-CM

## 2017-11-29 LAB — CBC WITH DIFFERENTIAL/PLATELET
Basophils Absolute: 0 10*3/uL (ref 0.0–0.1)
Basophils Relative: 0 %
EOS ABS: 0.3 10*3/uL (ref 0.0–0.7)
Eosinophils Relative: 3 %
HEMATOCRIT: 38.5 % — AB (ref 39.0–52.0)
HEMOGLOBIN: 13 g/dL (ref 13.0–17.0)
LYMPHS ABS: 1.5 10*3/uL (ref 0.7–4.0)
LYMPHS PCT: 20 %
MCH: 28.3 pg (ref 26.0–34.0)
MCHC: 33.8 g/dL (ref 30.0–36.0)
MCV: 83.9 fL (ref 78.0–100.0)
MONOS PCT: 9 %
Monocytes Absolute: 0.7 10*3/uL (ref 0.1–1.0)
NEUTROS PCT: 68 %
Neutro Abs: 5.1 10*3/uL (ref 1.7–7.7)
Platelets: 358 10*3/uL (ref 150–400)
RBC: 4.59 MIL/uL (ref 4.22–5.81)
RDW: 12.3 % (ref 11.5–15.5)
WBC: 7.6 10*3/uL (ref 4.0–10.5)

## 2017-11-29 MED ORDER — ONDANSETRON 4 MG PO TBDP
4.0000 mg | ORAL_TABLET | Freq: Once | ORAL | Status: AC
  Administered 2017-11-29: 4 mg via ORAL
  Filled 2017-11-29: qty 1

## 2017-11-29 MED ORDER — TRAMADOL HCL 50 MG PO TABS: 50.0000 mg | ORAL_TABLET | Freq: Four times a day (QID) | ORAL | 0 refills | Status: DC | PRN

## 2017-11-29 MED ORDER — KETOROLAC TROMETHAMINE 30 MG/ML IJ SOLN: 30.0000 mg | Freq: Once | INTRAMUSCULAR | Status: DC

## 2017-11-29 MED ORDER — OXYCODONE-ACETAMINOPHEN 5-325 MG PO TABS
1.0000 | ORAL_TABLET | Freq: Once | ORAL | Status: AC
  Administered 2017-11-29: 1 via ORAL
  Filled 2017-11-29: qty 1

## 2017-11-29 MED ORDER — KETOROLAC TROMETHAMINE 30 MG/ML IJ SOLN
15.0000 mg | Freq: Once | INTRAMUSCULAR | Status: AC
  Administered 2017-11-29: 15 mg via INTRAVENOUS
  Filled 2017-11-29: qty 1

## 2017-11-29 NOTE — ED Provider Notes (Signed)
[facility] EMERGENCY DEPARTMENT Provider Note   CSN: [id number] Arrival date & time: 11/29/17  1508     History   Chief Complaint Chief Complaint  Patient presents with  . Headache    HPI [name] is a 67 y.o. male.  Patient complains of headache.  The history is provided by the patient. No language interpreter was used.  Headache   This is a new problem. The current episode started more than 2 days ago. The problem occurs constantly. The problem has not changed since onset.The headache is associated with nothing. The pain is located in the bilateral region. The quality of the pain is described as dull. The pain is at a severity of 6/10. The pain is moderate. The pain does not radiate. Pertinent negatives include no anorexia. He has tried nothing for the symptoms. The treatment provided no relief.    Past Medical History:  Diagnosis Date  . BPH with urinary obstruction   . CAD (coronary artery disease)    per cardiac cath 01-08-2003  non-obstructive mild cad w/ normal LVSF  . COPD (chronic obstructive pulmonary disease) ([place])    per pulmologist note in 2009  GOLD 1  . DDD (degenerative disc disease), cervical    C5-7, C7-T1  . Diverticulosis of colon   . Elevated PSA   . GERD (gastroesophageal reflux disease)   . History of adenomatous polyp of colon   . History of hypertension   . History of precordial chest pain    as of 07-22-2017  denies cardiac S&S  . Prostate cancer ([place])   . Wears glasses     Patient Active Problem List   Diagnosis Date Noted  . Malignant neoplasm of prostate ([place]) 10/15/2017  . Elevated prostate specific antigen (PSA) 11/25/2015  . 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 HYPERTENSION 12/24/2007  . Essential (primary) hypertension 12/24/2007  . ASTHMA 12/23/2007  . C O P D 12/23/2007  . G E R D 12/23/2007  . Chronic obstructive pulmonary disease ([place]) 12/23/2007  . Acid reflux  12/23/2007    Past Surgical History:  Procedure Laterality Date  . CARDIAC CATHETERIZATION  01-08-2003   dr Lyndel Safe   mild non-obstructive CAD,  normal LVSF, ef 55% (positive myoview)  . COLONOSCOPY  last one 12-14-2015  . PROSTATE BIOPSY N/A 07/25/2017   Procedure: BIOPSY TRANSRECTAL ULTRASONIC PROSTATE (TUBP);  Surgeon: Alexis Frock, MD;  Location: [facility];  Service: Urology;  Laterality: N/A;        Home Medications    Prior to Admission medications   Medication Sig Start Date End Date Taking? Authorizing Provider  acetaminophen (TYLENOL) 500 MG tablet Take 500 mg by mouth every 6 (six) hours as needed.    [provider]  aspirin 81 MG chewable tablet Chew 1 tablet (81 mg total) by mouth daily. Patient not taking: Reported on 10/16/2017 11/27/16   Fredia Sorrow, MD  atorvastatin (LIPITOR) 10 MG tablet Take 10 mg by mouth. 08/03/16   [provider]  benzonatate (TESSALON) 100 MG capsule Take 1 capsule (100 mg total) by mouth every 8 (eight) hours. 11/14/17   Varney Biles, MD  dextromethorphan-guaiFENesin (MUCINEX DM) 30-600 MG 12hr tablet Take 1 tablet by mouth 2 (two) times daily. 11/14/17   Varney Biles, MD  Misc Natural Products (PROSTATE THERAPY COMPLEX) CAPS Take by mouth.    [provider]  Multiple Vitamin (MULTIVITAMIN) tablet Take 1 tablet daily by mouth.  [provider]  naphazoline-glycerin (CLEAR EYES) 0.012-0.2 % SOLN Place 1-2 drops into both eyes every 4 (four) hours as needed for irritation. Reported on 11/30/2015    [provider]  NON FORMULARY Take 1 capsule by mouth daily. Prostate supplement    [provider]  ranitidine (ZANTAC) 150 MG tablet Take 150 mg as needed by mouth for heartburn.    [provider]  sildenafil (REVATIO) 20 MG tablet  09/24/17   [provider]  traMADol (ULTRAM) 50 MG tablet Take 1 tablet (50 mg total) by mouth every 6 (six) hours as  needed. 11/29/17   Milton Ferguson, MD    Family History Family History  Problem Relation Age of Onset  . Heart disease Brother   . Colon cancer Brother   . Heart disease Sister   . Cancer Sister        unknown  . Cancer Mother        unknown    Social History Social History   Tobacco Use  . Smoking status: Former Smoker    Packs/day: 1.50    Years: 20.00    Pack years: 30.00    Types: Cigarettes    Last attempt to quit: 07/23/1987    Years since quitting: 30.3  . Smokeless tobacco: Former Systems developer    Types: Snuff    Quit date: 07/23/1987  Substance Use Topics  . Alcohol use: No    Frequency: Never  . Drug use: No     Allergies   Patient has no known allergies.   Review of Systems Review of Systems  Constitutional: Negative for appetite change and fatigue.  HENT: Negative for congestion, ear discharge and sinus pressure.   Eyes: Negative for discharge.  Respiratory: Negative for cough.   Cardiovascular: Negative for chest pain.  Gastrointestinal: Negative for abdominal pain, anorexia and diarrhea.  Genitourinary: Negative for frequency and hematuria.  Musculoskeletal: Negative for back pain.  Skin: Negative for rash.  Neurological: Positive for headaches. Negative for seizures.  Psychiatric/Behavioral: Negative for hallucinations.     Physical Exam Updated Vital Signs BP (!) 149/92 (BP Location: Right Arm)   Pulse 63   Temp 98.1 F (36.7 C) (Oral)   Resp 16   SpO2 98%   Physical Exam  Constitutional: He is oriented to person, place, and time. He appears well-developed.  HENT:  Head: Normocephalic.  Eyes: Conjunctivae and EOM are normal. No scleral icterus.  Neck: Neck supple. No thyromegaly present.  Cardiovascular: Normal rate and regular rhythm. Exam reveals no gallop and no friction rub.  No murmur heard. Pulmonary/Chest: No stridor. He has no wheezes. He has no rales. He exhibits no tenderness.  Abdominal: He exhibits no distension. There is no  tenderness. There is no rebound.  Musculoskeletal: Normal range of motion. He exhibits no edema.  Lymphadenopathy:    He has no cervical adenopathy.  Neurological: He is oriented to person, place, and time. He exhibits normal muscle tone. Coordination normal.  Skin: No rash noted. No erythema.  Psychiatric: He has a normal mood and affect. His behavior is normal.  Nursing note and vitals reviewed.    ED Treatments / Results  Labs (all labs ordered are listed, but only abnormal results are displayed) Labs Reviewed  CBC WITH DIFFERENTIAL/PLATELET - Abnormal; Notable for the following components:      Result Value   HCT 38.5 (*)    All other components within normal limits    EKG None  Radiology Ct  Head Wo Contrast  Result Date: 11/29/2017 CLINICAL DATA:  Posterior neck pain and headaches for months increased in severity over past 5 days, history prostate cancer, COPD, coronary artery disease, hypertension EXAM: CT HEAD WITHOUT CONTRAST CT CERVICAL SPINE WITHOUT CONTRAST TECHNIQUE: Multidetector CT imaging of the head and cervical spine was performed following the standard protocol without intravenous contrast. Multiplanar CT image reconstructions of the cervical spine were also generated. COMPARISON:  12/15/2012 CT head; correlation chest radiograph 11/14/2017 FINDINGS: CT HEAD FINDINGS Brain: Asymmetry of lateral ventricles LEFT larger than RIGHT unchanged. No midline shift or mass effect. Small vessel chronic ischemic changes of deep cerebral white matter. No intracranial hemorrhage, mass lesion, or evidence of acute infarction. No extra-axial fluid collections. Vascular: Atherosclerotic calcification of internal carotid and vertebral arteries at skull base Skull: Intact Sinuses/Orbits: Clear Other: N/A CT CERVICAL SPINE FINDINGS Alignment: Minimal anterolisthesis at C4-C5. Remaining alignment normal. Skull base and vertebrae: Skull base intact. Degenerative disc disease changes at C5-C6,  C6-C7 and C7-T1 with disc space narrowing and endplate spur formation. Vertebral body heights maintained. No fracture, subluxation or bone destruction. Scattered facet degenerative changes. Soft tissues and spinal canal: Prevertebral soft tissues normal thickness. Disc levels: Minimal endplate spur formation and bulging disc. Spinal canal patent. Upper chest: Pleural thickening and question pleural nodularity at RIGHT apex, not identified on most recent chest radiograph. LEFT apex clear. 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 Small vessel chronic ischemic changes of deep cerebral white matter. No acute intracranial abnormalities. Degenerative disc and facet disease changes of the cervical spine with minimal anterolisthesis at C4-C5. No acute cervical spine abnormalities. Pleural thickening and nodularity at RIGHT apex of uncertain etiology; CT chest with contrast recommended for further evaluation. Electronically Signed   By: Lavonia Dana M.D.   On: 11/29/2017 18:01   Ct Cervical Spine Wo Contrast  Result Date: 11/29/2017 CLINICAL DATA:  Posterior neck pain and headaches for months increased in severity over past 5 days, history prostate cancer, COPD, coronary artery disease, hypertension EXAM: CT HEAD WITHOUT CONTRAST CT CERVICAL SPINE WITHOUT CONTRAST TECHNIQUE: Multidetector CT imaging of the head and cervical spine was performed following the standard protocol without intravenous contrast. Multiplanar CT image reconstructions of the cervical spine were also generated. COMPARISON:  12/15/2012 CT head; correlation chest radiograph 11/14/2017 FINDINGS: CT HEAD FINDINGS Brain: Asymmetry of lateral ventricles LEFT larger than RIGHT unchanged. No midline shift or mass effect. Small vessel chronic ischemic changes of deep cerebral white matter. No intracranial hemorrhage, mass lesion, or evidence of acute infarction. No extra-axial fluid collections. Vascular:  Atherosclerotic calcification of internal carotid and vertebral arteries at skull base Skull: Intact Sinuses/Orbits: Clear Other: N/A CT CERVICAL SPINE FINDINGS Alignment: Minimal anterolisthesis at C4-C5. Remaining alignment normal. Skull base and vertebrae: Skull base intact. Degenerative disc disease changes at C5-C6, C6-C7 and C7-T1 with disc space narrowing and endplate spur formation. Vertebral body heights maintained. No fracture, subluxation or bone destruction. Scattered facet degenerative changes. Soft tissues and spinal canal: Prevertebral soft tissues normal thickness. Disc levels: Minimal endplate spur formation and bulging disc. Spinal canal patent. Upper chest: Pleural thickening and question pleural nodularity at RIGHT apex, not identified on most recent chest radiograph. LEFT apex clear. 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 Small vessel chronic ischemic changes of deep cerebral white matter. No acute intracranial abnormalities. Degenerative disc and facet disease changes of the cervical spine with minimal anterolisthesis at C4-C5. No acute cervical spine abnormalities.  Pleural thickening and nodularity at RIGHT apex of uncertain etiology; CT chest with contrast recommended for further evaluation. Electronically Signed   By: Lavonia Dana M.D.   On: 11/29/2017 18:01    Procedures Procedures (including critical care time)  Medications Ordered in ED Medications  oxyCODONE-acetaminophen (PERCOCET/ROXICET) 5-325 MG per tablet 1 tablet (1 tablet Oral Given 11/29/17 1546)  ondansetron (ZOFRAN-ODT) disintegrating tablet 4 mg (4 mg Oral Given 11/29/17 1547)  ketorolac (TORADOL) 30 MG/ML injection 15 mg (15 mg Intravenous Given 11/29/17 1929)     Initial Impression / Assessment and Plan / ED Course  I have reviewed the triage vital signs and the nursing notes.  Pertinent labs & imaging results that were available during my care of the patient were  reviewed by me and considered in my medical decision making (see chart for details).     Patient with a headache.  CT scan of the head and neck has no acute changes.  CBC unremarkable.  Patient improved with Toradol will be sent home with some Ultram will follow up with his PCP  Final Clinical Impressions(s) / ED Diagnoses   Final diagnoses:  None    ED Discharge Orders        Ordered    traMADol (ULTRAM) 50 MG tablet  Every 6 hours PRN     11/29/17 2103       Milton Ferguson, MD 11/29/17 2107

## 2017-11-29 NOTE — ED Triage Notes (Signed)
Pt reports posterior headache and neck pain that has been occurring for extended amount of time but more severe x 1 week. Denies n/v or sensitivity to light.

## 2017-11-29 NOTE — ED Notes (Signed)
Patient reports headache from Tuesday night. He took tylenol, "it helped but not for long"

## 2017-11-29 NOTE — ED Notes (Signed)
Pt discharged from ED; instructions provided and scripts given; Pt encouraged to return to ED if symptoms worsen and to f/u with PCP; Pt verbalized understanding of all instructions 

## 2017-11-29 NOTE — Discharge Instructions (Addendum)
Follow-up with your family doctor next week for recheck. 

## 2017-11-29 NOTE — ED Provider Notes (Signed)
Patient placed in [facility] pathway, seen and evaluated   Chief Complaint: Neck pain and headache  HPI:   67 year old gentleman who presents the emergency department chief complaint of severe neck pain.  He states that over the past week he has had sharp shooting pain up the both sides of his neck that radiate around his head.  He denies any photophobia, weakness of the upper extremities, nausea, vomiting or fevers.  He has no known injuries.  Is worse when he lies flat and better when he sits up but constant.  He states that no movements seem to make it worse  ROS: Neck pain (one)  Physical Exam:   Gen: No distress  Neuro: Awake and Alert  Skin: Warm    Focused Exam: Patient with full range of motion of the neck, tender to palpation mildly cervical paraspinal muscles.   Initiation of care has begun. The patient has been counseled on the process, plan, and necessity for staying for the completion/evaluation, and the remainder of the medical screening examination    Ned Grace 11/29/17 1538    Lacretia Leigh, MD 11/30/17 2217

## 2017-12-08 ENCOUNTER — Emergency Department (HOSPITAL_COMMUNITY)
Admission: EM | Admit: 2017-12-08 | Discharge: 2017-12-08 | Disposition: A | Discharge status: Home / Self Care | Payer: Medicare Other | Attending: Emergency Medicine | Admitting: Emergency Medicine

## 2017-12-08 ENCOUNTER — Emergency Department (HOSPITAL_COMMUNITY): Payer: Medicare Other

## 2017-12-08 ENCOUNTER — Other Ambulatory Visit: Payer: Self-pay

## 2017-12-08 ENCOUNTER — Encounter (HOSPITAL_COMMUNITY): Payer: Self-pay

## 2017-12-08 DIAGNOSIS — Z87891 Personal history of nicotine dependence: Secondary | ICD-10-CM | POA: Insufficient documentation

## 2017-12-08 DIAGNOSIS — R05 Cough: Secondary | ICD-10-CM | POA: Diagnosis present

## 2017-12-08 DIAGNOSIS — Z8546 Personal history of malignant neoplasm of prostate: Secondary | ICD-10-CM | POA: Diagnosis not present

## 2017-12-08 DIAGNOSIS — J189 Pneumonia, unspecified organism: Secondary | ICD-10-CM | POA: Insufficient documentation

## 2017-12-08 DIAGNOSIS — Z79899 Other long term (current) drug therapy: Secondary | ICD-10-CM | POA: Insufficient documentation

## 2017-12-08 DIAGNOSIS — I10 Essential (primary) hypertension: Secondary | ICD-10-CM | POA: Diagnosis not present

## 2017-12-08 DIAGNOSIS — I2510 Atherosclerotic heart disease of native coronary artery without angina pectoris: Secondary | ICD-10-CM | POA: Insufficient documentation

## 2017-12-08 DIAGNOSIS — R059 Cough, unspecified: Secondary | ICD-10-CM

## 2017-12-08 DIAGNOSIS — J449 Chronic obstructive pulmonary disease, unspecified: Secondary | ICD-10-CM | POA: Insufficient documentation

## 2017-12-08 LAB — I-STAT CHEM 8, ED
BUN: 9 mg/dL (ref 6–20)
Calcium, Ion: 1.12 mmol/L — ABNORMAL LOW (ref 1.15–1.40)
Chloride: 102 mmol/L (ref 101–111)
Creatinine, Ser: 0.8 mg/dL (ref 0.61–1.24)
GLUCOSE: 88 mg/dL (ref 65–99)
HCT: 36 % — ABNORMAL LOW (ref 39.0–52.0)
Hemoglobin: 12.2 g/dL — ABNORMAL LOW (ref 13.0–17.0)
POTASSIUM: 4.3 mmol/L (ref 3.5–5.1)
SODIUM: 137 mmol/L (ref 135–145)
TCO2: 26 mmol/L (ref 22–32)

## 2017-12-08 LAB — CBC WITH DIFFERENTIAL/PLATELET
Basophils Absolute: 0 10*3/uL (ref 0.0–0.1)
Basophils Relative: 0 %
EOS PCT: 5 %
Eosinophils Absolute: 0.3 10*3/uL (ref 0.0–0.7)
HCT: 33.7 % — ABNORMAL LOW (ref 39.0–52.0)
Hemoglobin: 11 g/dL — ABNORMAL LOW (ref 13.0–17.0)
LYMPHS PCT: 19 %
Lymphs Abs: 1.3 10*3/uL (ref 0.7–4.0)
MCH: 26.8 pg (ref 26.0–34.0)
MCHC: 32.6 g/dL (ref 30.0–36.0)
MCV: 82.2 fL (ref 78.0–100.0)
Monocytes Absolute: 0.8 10*3/uL (ref 0.1–1.0)
Monocytes Relative: 11 %
Neutro Abs: 4.5 10*3/uL (ref 1.7–7.7)
Neutrophils Relative %: 65 %
PLATELETS: 353 10*3/uL (ref 150–400)
RBC: 4.1 MIL/uL — AB (ref 4.22–5.81)
RDW: 12.1 % (ref 11.5–15.5)
WBC: 6.8 10*3/uL (ref 4.0–10.5)

## 2017-12-08 MED ORDER — BENZONATATE 100 MG PO CAPS
100.0000 mg | ORAL_CAPSULE | Freq: Once | ORAL | Status: AC
  Administered 2017-12-08: 100 mg via ORAL
  Filled 2017-12-08: qty 1

## 2017-12-08 MED ORDER — DOXYCYCLINE HYCLATE 100 MG PO CAPS: 100.0000 mg | ORAL_CAPSULE | Freq: Two times a day (BID) | ORAL | 0 refills | Status: DC

## 2017-12-08 MED ORDER — DOXYCYCLINE HYCLATE 100 MG PO TABS
100.0000 mg | ORAL_TABLET | Freq: Once | ORAL | Status: AC
  Administered 2017-12-08: 100 mg via ORAL
  Filled 2017-12-08: qty 1

## 2017-12-08 MED ORDER — IOPAMIDOL (ISOVUE-370) INJECTION 76%
INTRAVENOUS | Status: AC
  Administered 2017-12-08: 80 mL
  Filled 2017-12-08: qty 100

## 2017-12-08 NOTE — ED Provider Notes (Signed)
[facility] Provider Note   CSN: [id number] Arrival date & time: 12/08/17  8185     History   Chief Complaint Chief Complaint  Patient presents with  . cough/rib pain    HPI [name] is a 67 y.o. male.  HPI   67 year old male with history of prostate cancer, CAD, COPD, presenting for evaluation of cough and chest pain.  Patient report for the past 2-3 weeks he has had recurrent cough occasionally productive with sputum.  Cough is associated with pleuritic chest pain, and occasional shortness of breath.  Sometimes endorsing sore throat.  He tried over-the-counter medication without adequate relief.  Complaining of pain to his left wrist with cough.  This report been diagnosed with prostate cancer several months prior and is scheduled to have surgery by urologist, Dr. Tammi Klippel next months on April 26.  He denies fever, weight changes, or night sweats.  Denies smoking or alcohol abuse.  Symptom is moderate in severity.  Past Medical History:  Diagnosis Date  . BPH with urinary obstruction   . CAD (coronary artery disease)    per cardiac cath 01-08-2003  non-obstructive mild cad w/ normal LVSF  . COPD (chronic obstructive pulmonary disease) ([place])    per pulmologist note in 2009  GOLD 1  . DDD (degenerative disc disease), cervical    C5-7, C7-T1  . Diverticulosis of colon   . Elevated PSA   . GERD (gastroesophageal reflux disease)   . History of adenomatous polyp of colon   . History of hypertension   . History of precordial chest pain    as of 07-22-2017  denies cardiac S&S  . Prostate cancer ([place])   . Wears glasses     Patient Active Problem List   Diagnosis Date Noted  . Malignant neoplasm of prostate ([place]) 10/15/2017  . Elevated prostate specific antigen (PSA) 11/25/2015  . 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 HYPERTENSION 12/24/2007  . Essential (primary) hypertension 12/24/2007  . ASTHMA  12/23/2007  . C O P D 12/23/2007  . G E R D 12/23/2007  . Chronic obstructive pulmonary disease ([place]) 12/23/2007  . Acid reflux 12/23/2007    Past Surgical History:  Procedure Laterality Date  . CARDIAC CATHETERIZATION  01-08-2003   dr Lyndel Safe   mild non-obstructive CAD,  normal LVSF, ef 55% (positive myoview)  . COLONOSCOPY  last one 12-14-2015  . PROSTATE BIOPSY N/A 07/25/2017   Procedure: BIOPSY TRANSRECTAL ULTRASONIC PROSTATE (TUBP);  Surgeon: Alexis Frock, MD;  Location: [facility];  Service: Urology;  Laterality: N/A;        Home Medications    Prior to Admission medications   Medication Sig Start Date End Date Taking? Authorizing Provider  acetaminophen (TYLENOL) 500 MG tablet Take 500 mg by mouth every 6 (six) hours as needed.    [provider]  aspirin 81 MG chewable tablet Chew 1 tablet (81 mg total) by mouth daily. Patient not taking: Reported on 10/16/2017 11/27/16   Fredia Sorrow, MD  atorvastatin (LIPITOR) 10 MG tablet Take 10 mg by mouth. 08/03/16   [provider]  benzonatate (TESSALON) 100 MG capsule Take 1 capsule (100 mg total) by mouth every 8 (eight) hours. 11/14/17   Varney Biles, MD  dextromethorphan-guaiFENesin (MUCINEX DM) 30-600 MG 12hr tablet Take 1 tablet by mouth 2 (two) times daily. 11/14/17   Varney Biles, MD  Misc Natural Products (PROSTATE THERAPY COMPLEX) CAPS Take by mouth.  [provider]  Multiple Vitamin (MULTIVITAMIN) tablet Take 1 tablet daily by mouth.    [provider]  naphazoline-glycerin (CLEAR EYES) 0.012-0.2 % SOLN Place 1-2 drops into both eyes every 4 (four) hours as needed for irritation. Reported on 11/30/2015    [provider]  NON FORMULARY Take 1 capsule by mouth daily. Prostate supplement    [provider]  ranitidine (ZANTAC) 150 MG tablet Take 150 mg as needed by mouth for heartburn.    [provider]  sildenafil (REVATIO) 20 MG tablet   09/24/17   [provider]  traMADol (ULTRAM) 50 MG tablet Take 1 tablet (50 mg total) by mouth every 6 (six) hours as needed. 11/29/17   Milton Ferguson, MD    Family History Family History  Problem Relation Age of Onset  . Heart disease Brother   . Colon cancer Brother   . Heart disease Sister   . Cancer Sister        unknown  . Cancer Mother        unknown    Social History Social History   Tobacco Use  . Smoking status: Former Smoker    Packs/day: 1.50    Years: 20.00    Pack years: 30.00    Types: Cigarettes    Last attempt to quit: 07/23/1987    Years since quitting: 30.4  . Smokeless tobacco: Former Systems developer    Types: Snuff    Quit date: 07/23/1987  Substance Use Topics  . Alcohol use: No    Frequency: Never  . Drug use: No     Allergies   Patient has no known allergies.   Review of Systems Review of Systems  All other systems reviewed and are negative.    Physical Exam Updated Vital Signs BP 126/76 (BP Location: Right Arm)   Pulse 99   Temp 98.8 F (37.1 C) (Oral)   Resp 18   SpO2 99%   Physical Exam  Constitutional: He appears well-developed and well-nourished. No distress.  Thin appearing elderly male laying in bed, actively coughing.  HENT:  Head: Atraumatic.  Mouth/Throat: Oropharynx is clear and moist.  Eyes: Pupils are equal, round, and reactive to light. Conjunctivae and EOM are normal.  Neck: Normal range of motion. Neck supple. No JVD present.  Cardiovascular: Normal rate and regular rhythm.  Pulmonary/Chest: Effort normal. He has rales (Faint crackles heard at the right lung base without any wheezes or  or rhonchi.).  Abdominal: Soft. He exhibits no distension. There is no tenderness.  Musculoskeletal: He exhibits no edema.  No lower extremity edema, no calf tenderness.  Neurological: He is alert.  Skin: No rash noted.  Psychiatric: He has a normal mood and affect.  Nursing note and vitals reviewed.    ED Treatments /  Results  Labs (all labs ordered are listed, but only abnormal results are displayed) Labs Reviewed  CBC WITH DIFFERENTIAL/PLATELET - Abnormal; Notable for the following components:      Result Value   RBC 4.10 (*)    Hemoglobin 11.0 (*)    HCT 33.7 (*)    All other components within normal limits  I-STAT CHEM 8, ED - Abnormal; Notable for the following components:   Calcium, Ion 1.12 (*)    Hemoglobin 12.2 (*)    HCT 36.0 (*)    All other components within normal limits    EKG EKG Interpretation  Date/Time:  Sunday December 08 2017 13:48:55 EDT Ventricular Rate:  104 PR  Interval:    QRS Duration: 98 QT Interval:  379 QTC Calculation: 499 R Axis:   -46 Text Interpretation:  Sinus tachycardia Atrial premature complexes LAD, consider left anterior fascicular block Abnormal R-wave progression, early transition Borderline prolonged QT interval SINCE LAST TRACING HEART RATE HAS INCREASED Confirmed by Orlie Dakin [id number]) on 12/08/2017 2:07:58 PM   Radiology Dg Chest 2 View  Result Date: 12/08/2017 CLINICAL DATA:  Cough and congestion for 2 weeks with earache and right rib pain. EXAM: CHEST - 2 VIEW COMPARISON:  11/14/2017 FINDINGS: Reticulonodular densities in the right more than left upper lobes that are newly seen. A apical subpleural opacity closely associated with the right lateral fourth rib was likely present in retrospect, indeterminate. There is a background of airway thickening. No edema, effusion, or pneumothorax. Normal heart size. IMPRESSION: Right more than left reticular and nodular opacities with rapid development/progression favoring infectious or inflammatory process. After convalescence, if these nodular areas persist on radiography a chest CT is recommended to evaluate for metastatic disease in this patient with history of high-grade prostate carcinoma. Electronically Signed   By: Monte Fantasia M.D.   On: 12/08/2017 10:35    Procedures Procedures (including critical  care time)  Medications Ordered in ED Medications  benzonatate (TESSALON) capsule 100 mg (100 mg Oral Given 12/08/17 1343)  iopamidol (ISOVUE-370) 76 % injection (80 mLs  Contrast Given 12/08/17 1557)     Initial Impression / Assessment and Plan / ED Course  I have reviewed the triage vital signs and the nursing notes.  Pertinent labs & imaging results that were available during my care of the patient were reviewed by me and considered in my medical decision making (see chart for details).     BP 121/86   Pulse 78   Temp 98.8 F (37.1 C) (Oral)   Resp 18   SpO2 98%    Final Clinical Impressions(s) / ED Diagnoses   Final diagnoses:  Cough    ED Discharge Orders    None     12:55 PM Patient with a recurrent cough for the past 2-3 weeks.  Some mild pleuritic component with that and some occasional shortness of breath.  Initial screen chest x-ray shows right more than left reticular and nodular opacity with rapid development and progression favor infectious or inflammatory process. Given recent history of active cancer, will consider evaluating for potential PE or malignancy causing his cough by obtaining a chest CT angiogram.  Cough medication given, screening EKG obtained.  4:29 PM Pt signed out to Dr. Winfred Leeds who will determine disposition.    Domenic Moras, PA-C 12/08/17 [place], MD 12/08/17 [phone]

## 2017-12-08 NOTE — ED Notes (Signed)
IV ultrasound unsuccessful multiple times by PA.

## 2017-12-08 NOTE — ED Triage Notes (Signed)
Patient complains of cough, congestion with rib pain x 2 weeks, right earache for same. Pain to ribs with inspiration and cough, denies trauma

## 2017-12-08 NOTE — ED Notes (Signed)
IV access attempted X1 by this RN. Bowie PA to attempt IV ultrasound.

## 2017-12-08 NOTE — Discharge Instructions (Signed)
Take the antibiotic as prescribed.  The infectious disease clinic will call you within the next few days to schedule an appointment.  If you do not hear from the office by Tuesday, December 10, 2017, call Dr. Hale Bogus office to schedule an appointment.  Tell office staff that [name] spoke with Dr. Megan Salon about your case

## 2017-12-08 NOTE — ED Provider Notes (Addendum)
Complains of cough for the past 2 weeks.  Coughing up a slight amount of white mucus.  Other associated symptoms include slight soreness diffusely in his ribs from coughing he has no pain now he also reports pain in his right ear coming approximately once per hour lasting a split second at a time.  He has no pain presently on exam alert and in no distress.  HEENT exam no facial asymmetry bilateral tympanic membranes normal.  Neck is supple no bruit lungs clear to auscultation heart regular rate and rhythm abdomen nondistended nontender extremities without edema   Orlie Dakin, MD 12/08/17 1641 Chest x-ray viewed by me Consulted Dr. Michel Bickers infectious disease specialist by telephone.  He suggests sputum culture for acid-fast bacilli and fungus if patient can produce specimen.  Prescription doxycycline 100 mg twice daily for 1 week.  The infectious disease clinic will contact the patient to be seen in follow-up Diagnosis atypical pneumonia Results for orders placed or performed during the hospital encounter of 12/08/17  CBC with Differential/Platelet  Result Value Ref Range   WBC 6.8 4.0 - 10.5 K/uL   RBC 4.10 (L) 4.22 - 5.81 MIL/uL   Hemoglobin 11.0 (L) 13.0 - 17.0 g/dL   HCT 33.7 (L) 39.0 - 52.0 %   MCV 82.2 78.0 - 100.0 fL   MCH 26.8 26.0 - 34.0 pg   MCHC 32.6 30.0 - 36.0 g/dL   RDW 12.1 11.5 - 15.5 %   Platelets 353 150 - 400 K/uL   Neutrophils Relative % 65 %   Neutro Abs 4.5 1.7 - 7.7 K/uL   Lymphocytes Relative 19 %   Lymphs Abs 1.3 0.7 - 4.0 K/uL   Monocytes Relative 11 %   Monocytes Absolute 0.8 0.1 - 1.0 K/uL   Eosinophils Relative 5 %   Eosinophils Absolute 0.3 0.0 - 0.7 K/uL   Basophils Relative 0 %   Basophils Absolute 0.0 0.0 - 0.1 K/uL  I-stat chem 8, ed  Result Value Ref Range   Sodium 137 135 - 145 mmol/L   Potassium 4.3 3.5 - 5.1 mmol/L   Chloride 102 101 - 111 mmol/L   BUN 9 6 - 20 mg/dL   Creatinine, Ser 0.80 0.61 - 1.24 mg/dL   Glucose, Bld 88 65 - 99  mg/dL   Calcium, Ion 1.12 (L) 1.15 - 1.40 mmol/L   TCO2 26 22 - 32 mmol/L   Hemoglobin 12.2 (L) 13.0 - 17.0 g/dL   HCT 36.0 (L) 39.0 - 52.0 %   Dg Chest 2 View  Result Date: 12/08/2017 CLINICAL DATA:  Cough and congestion for 2 weeks with earache and right rib pain. EXAM: CHEST - 2 VIEW COMPARISON:  11/14/2017 FINDINGS: Reticulonodular densities in the right more than left upper lobes that are newly seen. A apical subpleural opacity closely associated with the right lateral fourth rib was likely present in retrospect, indeterminate. There is a background of airway thickening. No edema, effusion, or pneumothorax. Normal heart size. IMPRESSION: Right more than left reticular and nodular opacities with rapid development/progression favoring infectious or inflammatory process. After convalescence, if these nodular areas persist on radiography a chest CT is recommended to evaluate for metastatic disease in this patient with history of high-grade prostate carcinoma. Electronically Signed   By: Monte Fantasia M.D.   On: 12/08/2017 10:35   Dg Chest 2 View  Result Date: 11/14/2017 CLINICAL DATA:  Dry painful cough for 1 week EXAM: CHEST - 2 VIEW COMPARISON:  02/12/2017 FINDINGS: Normal  heart size, mediastinal contours, and pulmonary vascularity. Lungs appear slightly hyperinflated with mild central peribronchial thickening. No acute infiltrate, pleural effusion or pneumothorax. Bones demineralized. IMPRESSION: Chronic peribronchial thickening and slight hyperinflation which could represent COPD or asthma. No acute infiltrate. Electronically Signed   By: Lavonia Dana M.D.   On: 11/14/2017 13:47   Ct Head Wo Contrast  Result Date: 11/29/2017 CLINICAL DATA:  Posterior neck pain and headaches for months increased in severity over past 5 days, history prostate cancer, COPD, coronary artery disease, hypertension EXAM: CT HEAD WITHOUT CONTRAST CT CERVICAL SPINE WITHOUT CONTRAST TECHNIQUE: Multidetector CT imaging of  the head and cervical spine was performed following the standard protocol without intravenous contrast. Multiplanar CT image reconstructions of the cervical spine were also generated. COMPARISON:  12/15/2012 CT head; correlation chest radiograph 11/14/2017 FINDINGS: CT HEAD FINDINGS Brain: Asymmetry of lateral ventricles LEFT larger than RIGHT unchanged. No midline shift or mass effect. Small vessel chronic ischemic changes of deep cerebral white matter. No intracranial hemorrhage, mass lesion, or evidence of acute infarction. No extra-axial fluid collections. Vascular: Atherosclerotic calcification of internal carotid and vertebral arteries at skull base Skull: Intact Sinuses/Orbits: Clear Other: N/A CT CERVICAL SPINE FINDINGS Alignment: Minimal anterolisthesis at C4-C5. Remaining alignment normal. Skull base and vertebrae: Skull base intact. Degenerative disc disease changes at C5-C6, C6-C7 and C7-T1 with disc space narrowing and endplate spur formation. Vertebral body heights maintained. No fracture, subluxation or bone destruction. Scattered facet degenerative changes. Soft tissues and spinal canal: Prevertebral soft tissues normal thickness. Disc levels: Minimal endplate spur formation and bulging disc. Spinal canal patent. Upper chest: Pleural thickening and question pleural nodularity at RIGHT apex, not identified on most recent chest radiograph. LEFT apex clear. 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 Small vessel chronic ischemic changes of deep cerebral white matter. No acute intracranial abnormalities. Degenerative disc and facet disease changes of the cervical spine with minimal anterolisthesis at C4-C5. No acute cervical spine abnormalities. Pleural thickening and nodularity at RIGHT apex of uncertain etiology; CT chest with contrast recommended for further evaluation. Electronically Signed   By: Lavonia Dana M.D.   On: 11/29/2017 18:01   Ct Angio Chest Pe W  And/or Wo Contrast  Result Date: 12/08/2017 CLINICAL DATA:  Cough.  Congestion.  Prostate cancer.  COPD. EXAM: CT ANGIOGRAPHY CHEST WITH CONTRAST TECHNIQUE: Multidetector CT imaging of the chest was performed using the standard protocol during bolus administration of intravenous contrast. Multiplanar CT image reconstructions and MIPs were obtained to evaluate the vascular anatomy. CONTRAST:  64mL ISOVUE-370 IOPAMIDOL (ISOVUE-370) INJECTION 76% COMPARISON:  Chest radiograph from earlier today. FINDINGS: Cardiovascular: The study is high quality for the evaluation of pulmonary embolism. There are no filling defects in the central, lobar, segmental or subsegmental pulmonary artery branches to suggest acute pulmonary embolism. Great vessels are normal in course and caliber. Normal heart size. No significant pericardial fluid/thickening. Mediastinum/Nodes: No discrete thyroid nodules. Patulous thoracic esophagus. Mildly enlarged 1.1 cm subcarinal node (series 6/image 74). Mild bilateral hilar adenopathy measuring up to 1.0 cm (series 6/image 67 on the right and image 74 on the left). No axillary adenopathy. Lungs/Pleura: No pneumothorax. No pleural effusion. Mild centrilobular emphysema with diffuse bronchial wall thickening. There are numerous poorly marginated nodular foci of consolidation scattered throughout both lungs, predominantly involving the peripheral lungs, asymmetrically involving the right lung. Representative peripheral right upper lobe 1.9 cm nodular focus of consolidation (series 7/image 44) and representative inferior right middle lobe 1.8 cm nodular  focus of consolidation (series 7/image 114), which is new since 08/26/2017 CT abdomen study. Upper abdomen: Simple 1.7 cm right liver lobe cyst. Musculoskeletal:  No aggressive appearing focal osseous lesions. Review of the MIP images confirms the above findings. IMPRESSION: 1. No pulmonary embolism. 2. Numerous poorly marginated nodular foci of  consolidation throughout the lungs, predominantly in the peripheral lungs, asymmetrically involving the right lung, which appear new/increasing since 08/26/2017 CT abdomen study and 11/14/2017 chest radiograph. This rapid onset favors an infectious etiology. Consider atypical pneumonia such as fungal pneumonia. 3. Mild mediastinal and bilateral hilar lymphadenopathy, nonspecific. 4. Recommend follow-up post treatment chest CT with IV contrast in 3 months to reassess these findings. Emphysema (ICD10-J43.9). Electronically Signed   By: Ilona Sorrel M.D.   On: 12/08/2017 16:32   Ct Cervical Spine Wo Contrast  Result Date: 11/29/2017 CLINICAL DATA:  Posterior neck pain and headaches for months increased in severity over past 5 days, history prostate cancer, COPD, coronary artery disease, hypertension EXAM: CT HEAD WITHOUT CONTRAST CT CERVICAL SPINE WITHOUT CONTRAST TECHNIQUE: Multidetector CT imaging of the head and cervical spine was performed following the standard protocol without intravenous contrast. Multiplanar CT image reconstructions of the cervical spine were also generated. COMPARISON:  12/15/2012 CT head; correlation chest radiograph 11/14/2017 FINDINGS: CT HEAD FINDINGS Brain: Asymmetry of lateral ventricles LEFT larger than RIGHT unchanged. No midline shift or mass effect. Small vessel chronic ischemic changes of deep cerebral white matter. No intracranial hemorrhage, mass lesion, or evidence of acute infarction. No extra-axial fluid collections. Vascular: Atherosclerotic calcification of internal carotid and vertebral arteries at skull base Skull: Intact Sinuses/Orbits: Clear Other: N/A CT CERVICAL SPINE FINDINGS Alignment: Minimal anterolisthesis at C4-C5. Remaining alignment normal. Skull base and vertebrae: Skull base intact. Degenerative disc disease changes at C5-C6, C6-C7 and C7-T1 with disc space narrowing and endplate spur formation. Vertebral body heights maintained. No fracture, subluxation  or bone destruction. Scattered facet degenerative changes. Soft tissues and spinal canal: Prevertebral soft tissues normal thickness. Disc levels: Minimal endplate spur formation and bulging disc. Spinal canal patent. Upper chest: Pleural thickening and question pleural nodularity at RIGHT apex, not identified on most recent chest radiograph. LEFT apex clear. 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 Small vessel chronic ischemic changes of deep cerebral white matter. No acute intracranial abnormalities. Degenerative disc and facet disease changes of the cervical spine with minimal anterolisthesis at C4-C5. No acute cervical spine abnormalities. Pleural thickening and nodularity at RIGHT apex of uncertain etiology; CT chest with contrast recommended for further evaluation. Electronically Signed   By: Lavonia Dana M.D.   On: 11/29/2017 18:01     Orlie Dakin, MD 12/08/17 [phone]

## 2017-12-08 NOTE — ED Notes (Signed)
Pt verbalized understandind of discharge instructions.

## 2017-12-09 LAB — ACID FAST SMEAR (AFB, MYCOBACTERIA)

## 2017-12-09 LAB — ACID FAST SMEAR (AFB): ACID FAST SMEAR - AFSCU2: NEGATIVE

## 2017-12-16 ENCOUNTER — Inpatient Hospital Stay (HOSPITAL_COMMUNITY)
Admission: EM | Admit: 2017-12-16 | Discharge: 2017-12-21 | DRG: 194 | Disposition: A | Discharge status: Home / Self Care | Payer: Medicare Other | Attending: Internal Medicine | Admitting: Internal Medicine

## 2017-12-16 ENCOUNTER — Other Ambulatory Visit: Payer: Self-pay

## 2017-12-16 ENCOUNTER — Encounter (HOSPITAL_COMMUNITY): Payer: Self-pay | Admitting: Emergency Medicine

## 2017-12-16 ENCOUNTER — Emergency Department (HOSPITAL_COMMUNITY): Payer: Medicare Other

## 2017-12-16 DIAGNOSIS — B49 Unspecified mycosis: Secondary | ICD-10-CM | POA: Diagnosis not present

## 2017-12-16 DIAGNOSIS — Z87891 Personal history of nicotine dependence: Secondary | ICD-10-CM

## 2017-12-16 DIAGNOSIS — J440 Chronic obstructive pulmonary disease with acute lower respiratory infection: Secondary | ICD-10-CM | POA: Diagnosis present

## 2017-12-16 DIAGNOSIS — Z9889 Other specified postprocedural states: Secondary | ICD-10-CM

## 2017-12-16 DIAGNOSIS — J17 Pneumonia in diseases classified elsewhere: Secondary | ICD-10-CM

## 2017-12-16 DIAGNOSIS — Z8249 Family history of ischemic heart disease and other diseases of the circulatory system: Secondary | ICD-10-CM

## 2017-12-16 DIAGNOSIS — R634 Abnormal weight loss: Secondary | ICD-10-CM

## 2017-12-16 DIAGNOSIS — R079 Chest pain, unspecified: Secondary | ICD-10-CM

## 2017-12-16 DIAGNOSIS — J168 Pneumonia due to other specified infectious organisms: Principal | ICD-10-CM

## 2017-12-16 DIAGNOSIS — K219 Gastro-esophageal reflux disease without esophagitis: Secondary | ICD-10-CM | POA: Diagnosis present

## 2017-12-16 DIAGNOSIS — I2510 Atherosclerotic heart disease of native coronary artery without angina pectoris: Secondary | ICD-10-CM | POA: Diagnosis present

## 2017-12-16 DIAGNOSIS — D649 Anemia, unspecified: Secondary | ICD-10-CM | POA: Diagnosis present

## 2017-12-16 DIAGNOSIS — R0902 Hypoxemia: Secondary | ICD-10-CM

## 2017-12-16 DIAGNOSIS — Z23 Encounter for immunization: Secondary | ICD-10-CM

## 2017-12-16 DIAGNOSIS — Z86010 Personal history of colonic polyps: Secondary | ICD-10-CM

## 2017-12-16 DIAGNOSIS — Z681 Body mass index (BMI) 19 or less, adult: Secondary | ICD-10-CM

## 2017-12-16 DIAGNOSIS — C61 Malignant neoplasm of prostate: Secondary | ICD-10-CM

## 2017-12-16 DIAGNOSIS — J189 Pneumonia, unspecified organism: Secondary | ICD-10-CM

## 2017-12-16 DIAGNOSIS — I10 Essential (primary) hypertension: Secondary | ICD-10-CM | POA: Diagnosis present

## 2017-12-16 DIAGNOSIS — R000 Tachycardia, unspecified: Secondary | ICD-10-CM | POA: Diagnosis not present

## 2017-12-16 DIAGNOSIS — Z79899 Other long term (current) drug therapy: Secondary | ICD-10-CM

## 2017-12-16 DIAGNOSIS — R911 Solitary pulmonary nodule: Secondary | ICD-10-CM

## 2017-12-16 DIAGNOSIS — D630 Anemia in neoplastic disease: Secondary | ICD-10-CM | POA: Diagnosis present

## 2017-12-16 DIAGNOSIS — Z7982 Long term (current) use of aspirin: Secondary | ICD-10-CM

## 2017-12-16 LAB — CBC
HEMATOCRIT: 31.6 % — AB (ref 39.0–52.0)
HEMOGLOBIN: 10.6 g/dL — AB (ref 13.0–17.0)
MCH: 27.1 pg (ref 26.0–34.0)
MCHC: 33.5 g/dL (ref 30.0–36.0)
MCV: 80.8 fL (ref 78.0–100.0)
Platelets: 459 10*3/uL — ABNORMAL HIGH (ref 150–400)
RBC: 3.91 MIL/uL — AB (ref 4.22–5.81)
RDW: 11.9 % (ref 11.5–15.5)
WBC: 8.3 10*3/uL (ref 4.0–10.5)

## 2017-12-16 LAB — BASIC METABOLIC PANEL
Anion gap: 10 (ref 5–15)
BUN: 10 mg/dL (ref 6–20)
CALCIUM: 8.8 mg/dL — AB (ref 8.9–10.3)
CO2: 24 mmol/L (ref 22–32)
Chloride: 98 mmol/L — ABNORMAL LOW (ref 101–111)
Creatinine, Ser: 0.95 mg/dL (ref 0.61–1.24)
GLUCOSE: 93 mg/dL (ref 65–99)
POTASSIUM: 3.6 mmol/L (ref 3.5–5.1)
Sodium: 132 mmol/L — ABNORMAL LOW (ref 135–145)

## 2017-12-16 LAB — I-STAT TROPONIN, ED
TROPONIN I, POC: 0.01 ng/mL (ref 0.00–0.08)
TROPONIN I, POC: 0 ng/mL (ref 0.00–0.08)

## 2017-12-16 NOTE — ED Triage Notes (Signed)
Pt c/o chest pain when coughing. Diagnosed with pneumonia recently, completed antibiotics. Also c/o increased shortness of breath, hx COPD.

## 2017-12-17 ENCOUNTER — Encounter (HOSPITAL_COMMUNITY): Payer: Self-pay | Admitting: Internal Medicine

## 2017-12-17 ENCOUNTER — Other Ambulatory Visit: Payer: Self-pay

## 2017-12-17 DIAGNOSIS — C61 Malignant neoplasm of prostate: Secondary | ICD-10-CM | POA: Diagnosis present

## 2017-12-17 DIAGNOSIS — Z681 Body mass index (BMI) 19 or less, adult: Secondary | ICD-10-CM | POA: Diagnosis not present

## 2017-12-17 DIAGNOSIS — Z8249 Family history of ischemic heart disease and other diseases of the circulatory system: Secondary | ICD-10-CM | POA: Diagnosis not present

## 2017-12-17 DIAGNOSIS — M5033 Other cervical disc degeneration, cervicothoracic region: Secondary | ICD-10-CM

## 2017-12-17 DIAGNOSIS — R634 Abnormal weight loss: Secondary | ICD-10-CM

## 2017-12-17 DIAGNOSIS — Z87891 Personal history of nicotine dependence: Secondary | ICD-10-CM

## 2017-12-17 DIAGNOSIS — R000 Tachycardia, unspecified: Secondary | ICD-10-CM | POA: Diagnosis not present

## 2017-12-17 DIAGNOSIS — Z86010 Personal history of colonic polyps: Secondary | ICD-10-CM | POA: Diagnosis not present

## 2017-12-17 DIAGNOSIS — K219 Gastro-esophageal reflux disease without esophagitis: Secondary | ICD-10-CM | POA: Diagnosis present

## 2017-12-17 DIAGNOSIS — R0902 Hypoxemia: Secondary | ICD-10-CM | POA: Diagnosis not present

## 2017-12-17 DIAGNOSIS — D649 Anemia, unspecified: Secondary | ICD-10-CM | POA: Diagnosis present

## 2017-12-17 DIAGNOSIS — Z79899 Other long term (current) drug therapy: Secondary | ICD-10-CM | POA: Diagnosis not present

## 2017-12-17 DIAGNOSIS — R918 Other nonspecific abnormal finding of lung field: Secondary | ICD-10-CM | POA: Diagnosis not present

## 2017-12-17 DIAGNOSIS — J449 Chronic obstructive pulmonary disease, unspecified: Secondary | ICD-10-CM

## 2017-12-17 DIAGNOSIS — D630 Anemia in neoplastic disease: Secondary | ICD-10-CM | POA: Diagnosis present

## 2017-12-17 DIAGNOSIS — Z23 Encounter for immunization: Secondary | ICD-10-CM | POA: Diagnosis not present

## 2017-12-17 DIAGNOSIS — I2510 Atherosclerotic heart disease of native coronary artery without angina pectoris: Secondary | ICD-10-CM | POA: Diagnosis present

## 2017-12-17 DIAGNOSIS — Z7982 Long term (current) use of aspirin: Secondary | ICD-10-CM | POA: Diagnosis not present

## 2017-12-17 DIAGNOSIS — I10 Essential (primary) hypertension: Secondary | ICD-10-CM | POA: Diagnosis present

## 2017-12-17 DIAGNOSIS — R911 Solitary pulmonary nodule: Secondary | ICD-10-CM | POA: Diagnosis not present

## 2017-12-17 DIAGNOSIS — F419 Anxiety disorder, unspecified: Secondary | ICD-10-CM | POA: Diagnosis not present

## 2017-12-17 DIAGNOSIS — J440 Chronic obstructive pulmonary disease with acute lower respiratory infection: Secondary | ICD-10-CM | POA: Diagnosis present

## 2017-12-17 DIAGNOSIS — J168 Pneumonia due to other specified infectious organisms: Secondary | ICD-10-CM | POA: Diagnosis present

## 2017-12-17 DIAGNOSIS — J189 Pneumonia, unspecified organism: Secondary | ICD-10-CM

## 2017-12-17 DIAGNOSIS — R05 Cough: Secondary | ICD-10-CM | POA: Diagnosis not present

## 2017-12-17 DIAGNOSIS — B49 Unspecified mycosis: Secondary | ICD-10-CM | POA: Diagnosis present

## 2017-12-17 DIAGNOSIS — J17 Pneumonia in diseases classified elsewhere: Secondary | ICD-10-CM | POA: Diagnosis not present

## 2017-12-17 DIAGNOSIS — Z8546 Personal history of malignant neoplasm of prostate: Secondary | ICD-10-CM | POA: Diagnosis not present

## 2017-12-17 LAB — BASIC METABOLIC PANEL
ANION GAP: 10 (ref 5–15)
BUN: 10 mg/dL (ref 6–20)
CALCIUM: 8.9 mg/dL (ref 8.9–10.3)
CO2: 25 mmol/L (ref 22–32)
Chloride: 99 mmol/L — ABNORMAL LOW (ref 101–111)
Creatinine, Ser: 0.87 mg/dL (ref 0.61–1.24)
Glucose, Bld: 105 mg/dL — ABNORMAL HIGH (ref 65–99)
Potassium: 4.1 mmol/L (ref 3.5–5.1)
SODIUM: 134 mmol/L — AB (ref 135–145)

## 2017-12-17 LAB — EXPECTORATED SPUTUM ASSESSMENT W GRAM STAIN, RFLX TO RESP C

## 2017-12-17 LAB — CBC
HCT: 31.5 % — ABNORMAL LOW (ref 39.0–52.0)
HEMOGLOBIN: 10.4 g/dL — AB (ref 13.0–17.0)
MCH: 26.7 pg (ref 26.0–34.0)
MCHC: 33 g/dL (ref 30.0–36.0)
MCV: 80.8 fL (ref 78.0–100.0)
PLATELETS: 465 10*3/uL — AB (ref 150–400)
RBC: 3.9 MIL/uL — AB (ref 4.22–5.81)
RDW: 12 % (ref 11.5–15.5)
WBC: 8.2 10*3/uL (ref 4.0–10.5)

## 2017-12-17 LAB — RAPID HIV SCREEN (HIV 1/2 AB+AG)
HIV 1/2 Antibodies: NONREACTIVE
HIV-1 P24 Antigen - HIV24: NONREACTIVE

## 2017-12-17 LAB — CRYPTOCOCCAL ANTIGEN: Crypto Ag: NEGATIVE

## 2017-12-17 LAB — STREP PNEUMONIAE URINARY ANTIGEN: STREP PNEUMO URINARY ANTIGEN: NEGATIVE

## 2017-12-17 MED ORDER — ALBUTEROL SULFATE (2.5 MG/3ML) 0.083% IN NEBU: 2.5000 mg | INHALATION_SOLUTION | RESPIRATORY_TRACT | Status: DC | PRN

## 2017-12-17 MED ORDER — CEFTRIAXONE SODIUM 1 G IJ SOLR: 1.0000 g | INTRAMUSCULAR | Status: DC

## 2017-12-17 MED ORDER — SODIUM CHLORIDE 0.9 % IV SOLN
2.0000 g | Freq: Once | INTRAVENOUS | Status: AC
  Administered 2017-12-17: 2 g via INTRAVENOUS
  Filled 2017-12-17: qty 2

## 2017-12-17 MED ORDER — VANCOMYCIN HCL IN DEXTROSE 1-5 GM/200ML-% IV SOLN
1000.0000 mg | Freq: Once | INTRAVENOUS | Status: DC
Start: 2017-12-17 — End: 2017-12-17

## 2017-12-17 MED ORDER — ONDANSETRON HCL 4 MG/2ML IJ SOLN: 4.0000 mg | Freq: Four times a day (QID) | INTRAMUSCULAR | Status: DC | PRN

## 2017-12-17 MED ORDER — SODIUM CHLORIDE 0.9 % IV SOLN: 100.0000 mg | INTRAVENOUS | Status: DC

## 2017-12-17 MED ORDER — SODIUM CHLORIDE 0.9 % IV SOLN
200.0000 mg | Freq: Once | INTRAVENOUS | Status: AC
  Administered 2017-12-17: 200 mg via INTRAVENOUS
  Filled 2017-12-17: qty 200

## 2017-12-17 MED ORDER — SODIUM CHLORIDE 0.9 % IV SOLN
500.0000 mg | INTRAVENOUS | Status: DC
  Administered 2017-12-17 – 2017-12-20 (×4): 500 mg via INTRAVENOUS
  Filled 2017-12-17 (×4): qty 500

## 2017-12-17 MED ORDER — ACETAMINOPHEN 650 MG RE SUPP: 650.0000 mg | Freq: Four times a day (QID) | RECTAL | Status: DC | PRN

## 2017-12-17 MED ORDER — ONDANSETRON HCL 4 MG PO TABS: 4.0000 mg | ORAL_TABLET | Freq: Four times a day (QID) | ORAL | Status: DC | PRN

## 2017-12-17 MED ORDER — ASPIRIN EC 81 MG PO TBEC: 81.0000 mg | DELAYED_RELEASE_TABLET | Freq: Every day | ORAL | Status: DC | PRN

## 2017-12-17 MED ORDER — ACETAMINOPHEN 325 MG PO TABS
650.0000 mg | ORAL_TABLET | Freq: Four times a day (QID) | ORAL | Status: DC | PRN
  Administered 2017-12-17 – 2017-12-21 (×12): 650 mg via ORAL
  Filled 2017-12-17 (×12): qty 2

## 2017-12-17 MED ORDER — ENOXAPARIN SODIUM 40 MG/0.4ML ~~LOC~~ SOLN
40.0000 mg | SUBCUTANEOUS | Status: DC
  Administered 2017-12-17 – 2017-12-18 (×2): 40 mg via SUBCUTANEOUS
  Filled 2017-12-17 (×2): qty 0.4

## 2017-12-17 NOTE — ED Notes (Signed)
Breakfast tray given. °

## 2017-12-17 NOTE — Progress Notes (Signed)
Patient seen and examined this morning, admitted overnight by Dr. Glyn Ade, this is a 68 year old male with history of coronary artery disease, asthma, who presented to the emergency room about a week ago for URI type symptoms.  He was diagnosed with pneumonia, he was given doxycycline to take for 7 days.  Prior to discharge from the ED he underwent sputum cultures.  EDP at that time discussed with infectious disease over the phone.  Patient went home, and continued to feel sick it despite antibiotics and decided to come back to the hospital.  He is experiencing progressive shortness of breath as well as an unrelenting cough which is occasionally productive but mostly dry.  Sputum cultures from prior ED visit showed pseudohyphae and yeast.   Pneumonia -?  Fungal pneumonia versus tuberculosis versus septic emboli -ID consulted, discussed with Dr. Drucilla Schmidt today, he will need a bronchoscopy however at this point patient is somewhat unwilling for this so I will hold pulmonology consult until he is agreeable -AFB smear is in process, sending histoplasma, Aspergillus, cryptococcus, Legionella -Strep pneumo was negative -HIV was negative  History of asthma -Presently not wheezing  History of obstructive coronary artery disease -No chest pain, no cardiac symptoms  Normocytic anemia -Anemia panel pending   Costin M. Cruzita Lederer, MD [facility] [phone]  If 7PM-7AM, please contact night-coverage [email or web address] Password [email or web address]

## 2017-12-17 NOTE — H&P (Signed)
History and Physical    [name] PYP:[id number] DOB: Oct 12, 1950 DOA: 12/16/2017  PCP: Bernerd Limbo, MD  Patient coming from: Home.  Chief Complaint: Persistent cough.  HPI: [name] is a 67 y.o. male with history of nonobstructive CAD, asthma presents to the ER with complaint of persistent cough with productive sputum.  Patient symptoms started 3 weeks ago at that time had mild shortness of breath.  Denies any fever chills weight loss night sweats.  Denies any sick contacts or recent travel.  Has never had any history of TB.  Patient had come to the ER last week and had a chest x-ray done shows features concerning for pneumonia which includes possible TB as a differential.  At that time ER physician I discussed with on-call infectious disease consultant and obtained sputum for AFB.  Patient was discharged home on doxycycline.  Sputum for AFB has come back negative which was done one time.  Patient comes back to the ER today.  ED Course: Patient still has cough with productive sputum.  Denies any hemoptysis.  Again denies any night sweats weight loss of fever or chills.  Chest x-ray again shows persistent infiltrates concerning for fungal pneumonia versus septic emboli versus tuberculosis.  Reviewing patient's recent sputum cultures shows negative for A. fib but does show positive yeast and pseudohyphae.  Blood cultures were obtained and patient admitted for pneumonia.  Since there was yeast in the sputum patient was started on antifungal ERAXIS along with antibiotics for pneumonia.  Review of Systems: As per HPI, rest all negative.   Past Medical History:  Diagnosis Date  . BPH with urinary obstruction   . CAD (coronary artery disease)    per cardiac cath 01-08-2003  non-obstructive mild cad w/ normal LVSF  . COPD (chronic obstructive pulmonary disease) ([place])    per pulmologist note in 2009  GOLD 1  . DDD (degenerative disc disease), cervical    C5-7, C7-T1  . Diverticulosis of  colon   . Elevated PSA   . GERD (gastroesophageal reflux disease)   . History of adenomatous polyp of colon   . History of hypertension   . History of precordial chest pain    as of 07-22-2017  denies cardiac S&S  . Prostate cancer ([place])   . Wears glasses     Past Surgical History:  Procedure Laterality Date  . CARDIAC CATHETERIZATION  01-08-2003   dr Lyndel Safe   mild non-obstructive CAD,  normal LVSF, ef 55% (positive myoview)  . COLONOSCOPY  last one 12-14-2015  . PROSTATE BIOPSY N/A 07/25/2017   Procedure: BIOPSY TRANSRECTAL ULTRASONIC PROSTATE (TUBP);  Surgeon: Alexis Frock, MD;  Location: [facility];  Service: Urology;  Laterality: N/A;     reports that he quit smoking about 30 years ago. His smoking use included cigarettes. He has a 30.00 pack-year smoking history. He quit smokeless tobacco use about 30 years ago. His smokeless tobacco use included snuff. He reports that he does not drink alcohol or use drugs.  No Known Allergies  Family History  Problem Relation Age of Onset  . Heart disease Brother   . Colon cancer Brother   . Heart disease Sister   . Cancer Sister        unknown  . Cancer Mother        unknown    Prior to Admission medications   Medication Sig Start Date End Date Taking? Authorizing Provider  acetaminophen (TYLENOL) 500 MG tablet Take 500 mg by  mouth every 6 (six) hours as needed for headache (pain).    Yes [provider]  albuterol (PROVENTIL HFA;VENTOLIN HFA) 108 (90 Base) MCG/ACT inhaler Inhale 2 puffs into the lungs every 6 (six) hours as needed for wheezing or shortness of breath.   Yes [provider]  aspirin EC 81 MG tablet Take 81 mg by mouth daily as needed (chest pain).   Yes [provider]  guaiFENesin (MUCINEX PO) Take 1 tablet by mouth 2 (two) times daily as needed (congestion).   Yes [provider]  Multiple Vitamin (MULTIVITAMIN WITH MINERALS) TABS tablet Take 1 tablet by mouth  daily.   Yes [provider]  OVER THE COUNTER MEDICATION Take 1 tablet by mouth 2 (two) times daily. Super Beta Prostate supplement   Yes [provider]  OVER THE COUNTER MEDICATION Apply 1 application topically daily as needed (arthritis pain).   Yes [provider]  sildenafil (REVATIO) 20 MG tablet Take 60 mg by mouth daily as needed (erectile dysfunction).  09/24/17  Yes [provider]  aspirin 81 MG chewable tablet Chew 1 tablet (81 mg total) by mouth daily. Patient not taking: Reported on 10/16/2017 11/27/16   Fredia Sorrow, MD  benzonatate (TESSALON) 100 MG capsule Take 1 capsule (100 mg total) by mouth every 8 (eight) hours. Patient not taking: Reported on 12/08/2017 11/14/17   Varney Biles, MD  dextromethorphan-guaiFENesin [facility] DM) 30-600 MG 12hr tablet Take 1 tablet by mouth 2 (two) times daily. Patient not taking: Reported on 12/08/2017 11/14/17   Varney Biles, MD  doxycycline (VIBRAMYCIN) 100 MG capsule Take 1 capsule (100 mg total) by mouth 2 (two) times daily. One po bid x 7 days Patient not taking: Reported on 12/17/2017 12/08/17   Orlie Dakin, MD  traMADol (ULTRAM) 50 MG tablet Take 1 tablet (50 mg total) by mouth every 6 (six) hours as needed. Patient not taking: Reported on 12/08/2017 11/29/17   Milton Ferguson, MD    Physical Exam: Vitals:   12/16/17 2125 12/16/17 2328 12/17/17 0030 12/17/17 0045  BP: (!) 134/95 (!) 147/83 (!) 131/92 139/90  Pulse: 97 70 96 95  Resp: 16 18 (!) 28 (!) 33  Temp: 99.6 F (37.6 C)     TempSrc: Oral     SpO2: 96% 98% 97% 95%  Weight:      Height:          Constitutional: Moderately built and nourished. Vitals:   12/16/17 2125 12/16/17 2328 12/17/17 0030 12/17/17 0045  BP: (!) 134/95 (!) 147/83 (!) 131/92 139/90  Pulse: 97 70 96 95  Resp: 16 18 (!) 28 (!) 33  Temp: 99.6 F (37.6 C)     TempSrc: Oral     SpO2: 96% 98% 97% 95%  Weight:      Height:       Eyes: Anicteric no pallor. ENMT:  No discharge from the ears eyes nose or mouth. Neck: No mass felt.  No neck rigidity no JVD appreciated. Respiratory: No rhonchi or crepitations. Cardiovascular: S1-S2 heard no murmurs appreciated. Abdomen: Soft nontender bowel sounds present. Musculoskeletal: No edema.  No joint effusion. Skin: No rash.  Skin appears warm. Neurologic: Alert awake oriented to time place and person.  Moves all extremities. Psychiatric: Appears normal.  Normal affect.   Labs on Admission: I have personally reviewed following labs and imaging studies  CBC: Recent Labs  Lab 12/16/17 1934  WBC 8.3  HGB 10.6*  HCT 31.6*  MCV 80.8  PLT  998*   Basic Metabolic Panel: Recent Labs  Lab 12/16/17 1934  NA 132*  K 3.6  CL 98*  CO2 24  GLUCOSE 93  BUN 10  CREATININE 0.95  CALCIUM 8.8*   GFR: Estimated Creatinine Clearance: 53.3 mL/min (by C-G formula based on SCr of 0.95 mg/dL). Liver Function Tests: No results for input(s): AST, ALT, ALKPHOS, BILITOT, PROT, ALBUMIN in the last 168 hours. No results for input(s): LIPASE, AMYLASE in the last 168 hours. No results for input(s): AMMONIA in the last 168 hours. Coagulation Profile: No results for input(s): INR, PROTIME in the last 168 hours. Cardiac Enzymes: No results for input(s): CKTOTAL, CKMB, CKMBINDEX, TROPONINI in the last 168 hours. BNP (last 3 results) No results for input(s): PROBNP in the last 8760 hours. HbA1C: No results for input(s): HGBA1C in the last 72 hours. CBG: No results for input(s): GLUCAP in the last 168 hours. Lipid Profile: No results for input(s): CHOL, HDL, LDLCALC, TRIG, CHOLHDL, LDLDIRECT in the last 72 hours. Thyroid Function Tests: No results for input(s): TSH, T4TOTAL, FREET4, T3FREE, THYROIDAB in the last 72 hours. Anemia Panel: No results for input(s): VITAMINB12, FOLATE, FERRITIN, TIBC, IRON, RETICCTPCT in the last 72 hours. Urine analysis: No results found for: COLORURINE, APPEARANCEUR, LABSPEC, PHURINE,  GLUCOSEU, HGBUR, BILIRUBINUR, KETONESUR, PROTEINUR, UROBILINOGEN, NITRITE, LEUKOCYTESUR Sepsis Labs: @LABRCNTIP (procalcitonin:4,lacticidven:4) ) Recent Results (from the past 240 hour(s))  Acid Fast Smear (AFB)     Status: None   Collection Time: 12/08/17  5:08 PM  Result Value Ref Range Status   AFB Specimen Processing Concentration  Final   Acid Fast Smear Negative  Final    Comment: (NOTE) Performed At: [facility] [address] [place], [place] [id number] Rush Farmer MD [id number]    Source (AFB) EXPECTORATED SPUTUM  Final    Comment: Performed at [facility], [place] [address] [place], [place] [address]  Fungus Culture With Stain     Status: None (Preliminary result)   Collection Time: 12/08/17  5:08 PM  Result Value Ref Range Status   Fungus Stain Final report  Final    Comment: (NOTE) Performed At: [facility] [place], [place] [id number] Rush Farmer MD [id number]    Fungus (Mycology) Culture PENDING  Incomplete   Fungal Source EXPECTORATED SPUTUM  Final    Comment: Performed at [facility], [place] [address] [place], [place] [address]  Fungus Culture Result     Status: None   Collection Time: 12/08/17  5:08 PM  Result Value Ref Range Status   Result 1 Comment  Final    Comment: (NOTE) Pseudohyphae and yeasts observed Performed At: [facility] [place], [place] [id number] Rush Farmer MD [id number] Performed at [facility], [place] [address] [place], [place] [address]      Radiological Exams on Admission: Dg Chest 2 View  Result Date: 12/16/2017 CLINICAL DATA:  Productive cough for 1 week.  Chest pain. EXAM: CHEST - 2 VIEW COMPARISON:  Chest CT and chest radiograph on 12/08/2017. FINDINGS: Heart size is normal. Mild mediastinal lymphadenopathy in the right superior paratracheal region shows no significant change. Multiple small irregular nodular densities are again seen  bilaterally, with upper lobe predominance. One of these nodules in the lateral right lung base shows cystic lucency which may represent cavitation. No evidence of pleural effusion. IMPRESSION: No significant change in multiple ill-defined pulmonary nodular opacities with upper lobe predominance and right paratracheal lymphadenopathy. These findings are suspicious for  infectious etiology, possibly fungal pneumonia, tuberculosis, or septic emboli. Electronically Signed   By: Earle Gell M.D.   On: 12/16/2017 20:26     Assessment/Plan Active Problems:   CAP (community acquired pneumonia)   Normocytic normochromic anemia    1. Pneumonia differentials include fungal pneumonia/tuberculosis/septic emboli -ER physician after discussing with pharmacy and place patient on Eraxis for fungal pneumonia.  Patient is also on ceftriaxone and Zithromax for community acquired pneumonia.  Since patient only had one sputum for AFB 2 more sputum for AFB has been ordered.  Patient will be on airborne precautions until then.  Follow cultures.  Consult infectious disease consultant in a.m. 2. Normocytic normochromic anemia -follow CBC.  Check anemia panel. 3. History of asthma presently not wheezing. 4. History of nonobstructive CAD as per the chart.  Denies any chest pain.   DVT prophylaxis: Lovenox. Code Status: Full code. Family Communication: Discussed with patient. Disposition Plan: Home. Consults called: None. Admission status: Inpatient.   Rise Patience MD [facility] Pager [phone].  If 7PM-7AM, please contact night-coverage [email or web address] Password [email or web address]  12/17/2017, 3:21 AM

## 2017-12-17 NOTE — ED Notes (Signed)
Pt aware of need for sputum specimen. Pt given specimen cup.

## 2017-12-17 NOTE — ED Notes (Signed)
Breakfast tray ordered 

## 2017-12-17 NOTE — ED Notes (Signed)
Attempted to cal report  

## 2017-12-17 NOTE — Consult Note (Signed)
[facility] for Infectious Disease    Date of Admission:  12/16/2017     Total days of antibiotics                Reason for Consult: Atypical pneumonia   Referring Provider: Hal Hope  Primary Care Provider: Bernerd Limbo, MD   Assessment/Plan:  [name] is a 67 y/o male with cough starting about 1 month ago with imaging concerning for atypical pneumonia, TB, or fungal pneumonia. Previous AFB smear negative x 1 with culture pending. Sputum cultures did have pseudohyphae and yeasts growing with no specific identification. Blood cultures are currently pending. He may require a bronchoscopy to evaluate further and obtain sputum culture. He remains on airborne precautions as TB remains a differential.  1.  Continue airborne precautions until TB ruled out. 2.  Will check cryptococcus antigen, aspergillus antigen, and histoplasmosis antigen.  3.  Continue azithromycin for now as atypical pneumonia cannot be ruled out. 4. We will continue to follow.    Active Problems:   CAP (community acquired pneumonia)   Normocytic normochromic anemia   . enoxaparin (LOVENOX) injection  40 mg Subcutaneous Q24H     HPI: [name] is a 67 y.o. male with a PMH of COPD, prostate cancer, hypertension, CAD, and degenerative disk disease who was seen in the ED on 12/17/17 with cough and shortness of breath that have been going on for about 1 month.   Initially seen in the ED and diagnosed with viral URI with cough on 11/14/17. X-ray at the time with chronic peribronchial thickening and slight hyperinflation which could represent COPD or asthma. Seen again on 12/08/17 coughing up white mucus and soreness when coughing.  Chest x-ray with right more than left reticular and nodular opacities with rapid development favoring infectious or inflammatory process. CT scan with numerous poorly marginated nodular foci of consolidation through the lungs and asymetrically involving the right lung. Rapid onset  favors infectious process with consideration for atypical or fungal pneumonia. ID was consulted by phone with the recommendation for sputum culture for AFB and fungus. Was given 1 week course of doxycycline.   Continues to have cough and productive sputum without hemoptysis. Denied fevers, chills or weight loss. The AFB smear from 3/31 were negative with culture remaining pending. The fungal culture showed pseudohyphae and yeasts. Repeat cultures/smears for AFB, blood, sputum were collected today and are in process. Pharmacy recommended starting Eraxis He was also started on vancomycin and cefepime. Additional AFB cultures have been ordered. He is currently prescribed azithromycin as the cefepime and Eraxis were discontinued.   Interval History:  Continues to experience the associated symptoms of cough with no fevers, chills, or night sweats.  Currently awaiting a prostatectomy for newly diagnosed prostate cancer over the last 6 months.  Previously prescribed doxycycline with no significant improvement in symptoms.  Course of the symptoms have stayed about the same.  Severity is enough to cause shortness of breath at times secondary to coughing.  Denies any contact/known individuals with tuberculosis and has not been in prison or group settings recently.   Review of Systems: Review of Systems  Constitutional: Negative for chills, diaphoresis, fever, malaise/fatigue and weight loss.  Respiratory: Positive for cough, sputum production and shortness of breath. Negative for hemoptysis and wheezing.   Cardiovascular: Negative for chest pain.  Gastrointestinal: Negative for abdominal pain, constipation, diarrhea, nausea and vomiting.  Skin: Negative for rash.  Neurological: Negative for weakness and headaches.  Past Medical History:  Diagnosis Date  . BPH with urinary obstruction   . CAD (coronary artery disease)    per cardiac cath 01-08-2003  non-obstructive mild cad w/ normal LVSF  . COPD  (chronic obstructive pulmonary disease) ([place])    per pulmologist note in 2009  GOLD 1  . DDD (degenerative disc disease), cervical    C5-7, C7-T1  . Diverticulosis of colon   . Elevated PSA   . GERD (gastroesophageal reflux disease)   . History of adenomatous polyp of colon   . History of hypertension   . History of precordial chest pain    as of 07-22-2017  denies cardiac S&S  . Prostate cancer ([place])   . Wears glasses     Social History   Tobacco Use  . Smoking status: Former Smoker    Packs/day: 1.50    Years: 20.00    Pack years: 30.00    Types: Cigarettes    Last attempt to quit: 07/23/1987    Years since quitting: 30.4  . Smokeless tobacco: Former Systems developer    Types: Snuff    Quit date: 07/23/1987  Substance Use Topics  . Alcohol use: No    Frequency: Never  . Drug use: No    Family History  Problem Relation Age of Onset  . Heart disease Brother   . Colon cancer Brother   . Heart disease Sister   . Cancer Sister        unknown  . Cancer Mother        unknown    No Known Allergies  OBJECTIVE: Blood pressure (!) 155/97, pulse (!) 107, temperature 99.6 F (37.6 C), temperature source Oral, resp. rate (!) 36, height 5\' 4"  (1.626 m), weight 108 lb 11.2 oz (49.3 kg), SpO2 97 %.  Physical Exam  Constitutional: He is oriented to person, place, and time and well-developed, well-nourished, and in no distress. No distress.  Cardiovascular: Normal rate, regular rhythm, normal heart sounds and intact distal pulses. Exam reveals no gallop and no friction rub.  No murmur heard. Pulmonary/Chest: Effort normal. No respiratory distress. He has decreased breath sounds. He has no wheezes. He has no rales. He exhibits no tenderness.  Abdominal: Soft. Bowel sounds are normal. There is no tenderness.  Neurological: He is alert and oriented to person, place, and time.  Skin: Skin is warm and dry.  Psychiatric: Affect and judgment normal.    Lab Results Lab Results  Component  Value Date   WBC 8.2 12/17/2017   HGB 10.4 (L) 12/17/2017   HCT 31.5 (L) 12/17/2017   MCV 80.8 12/17/2017   PLT 465 (H) 12/17/2017    Lab Results  Component Value Date   CREATININE 0.87 12/17/2017   BUN 10 12/17/2017   NA 134 (L) 12/17/2017   K 4.1 12/17/2017   CL 99 (L) 12/17/2017   CO2 25 12/17/2017   No results found for: ALT, AST, GGT, ALKPHOS, BILITOT   Microbiology: Recent Results (from the past 240 hour(s))  Acid Fast Smear (AFB)     Status: None   Collection Time: 12/08/17  5:08 PM  Result Value Ref Range Status   AFB Specimen Processing Concentration  Final   Acid Fast Smear Negative  Final    Comment: (NOTE) Performed At: [facility] [address] [place], [place] [id number] Rush Farmer MD [id number]    Source (AFB) EXPECTORATED SPUTUM  Final    Comment: Performed at [facility], [place] [address],  [place], [place] [address]  Fungus Culture With Stain     Status: None (Preliminary result)   Collection Time: 12/08/17  5:08 PM  Result Value Ref Range Status   Fungus Stain Final report  Final    Comment: (NOTE) Performed At: [facility] [place], [place] [id number] Rush Farmer MD [id number]    Fungus (Mycology) Culture PENDING  Incomplete   Fungal Source EXPECTORATED SPUTUM  Final    Comment: Performed at [facility], [place] [address] [place], [place] [address]  Fungus Culture Result     Status: None   Collection Time: 12/08/17  5:08 PM  Result Value Ref Range Status   Result 1 Comment  Final    Comment: (NOTE) Pseudohyphae and yeasts observed Performed At: [facility] [place], [place] [id number] Rush Farmer MD [id number] Performed at [facility], [place] [address], [place], [place] [address]      Terri Piedra, [facility] [place] Pager  12/17/2017  10:19 AM

## 2017-12-17 NOTE — ED Notes (Signed)
Attempted to call report

## 2017-12-17 NOTE — ED Provider Notes (Signed)
[facility]ERGENCY DEPARTMENT Provider Note   CSN: [id number] Arrival date & time: 12/16/17  1734     History   Chief Complaint Chief Complaint  Patient presents with  . Cough  . Shortness of Breath    HPI [name] is a 67 y.o. male.  The history is provided by the patient and medical records.   67 y.o. M with hx of BPH, CAD, COPD, DDD, GERD, HTN, prostate cancer, presenting to the ED for continued cough and SOB.  States he was diagnosed with pneumonia about a week ago and was started on doxycycline.  States he completed the antibiotics but does not really feel any better.  States he occasionally is able to produce mucous with cough, most of the time is dry though.  Has not been coughing up blood.  Denies fever/chills.  He is not a smoker.  Denies hx of IVDU.  Past Medical History:  Diagnosis Date  . BPH with urinary obstruction   . CAD (coronary artery disease)    per cardiac cath 01-08-2003  non-obstructive mild cad w/ normal LVSF  . COPD (chronic obstructive pulmonary disease) ([place])    per pulmologist note in 2009  GOLD 1  . DDD (degenerative disc disease), cervical    C5-7, C7-T1  . Diverticulosis of colon   . Elevated PSA   . GERD (gastroesophageal reflux disease)   . History of adenomatous polyp of colon   . History of hypertension   . History of precordial chest pain    as of 07-22-2017  denies cardiac S&S  . Prostate cancer ([place])   . Wears glasses     Patient Active Problem List   Diagnosis Date Noted  . Malignant neoplasm of prostate ([place]) 10/15/2017  . Elevated prostate specific antigen (PSA) 11/25/2015  . 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 HYPERTENSION 12/24/2007  . Essential (primary) hypertension 12/24/2007  . ASTHMA 12/23/2007  . C O P D 12/23/2007  . G E R D 12/23/2007  . Chronic obstructive pulmonary disease ([place]) 12/23/2007  . Acid reflux 12/23/2007    Past Surgical History:    Procedure Laterality Date  . CARDIAC CATHETERIZATION  01-08-2003   dr Lyndel Safe   mild non-obstructive CAD,  normal LVSF, ef 55% (positive myoview)  . COLONOSCOPY  last one 12-14-2015  . PROSTATE BIOPSY N/A 07/25/2017   Procedure: BIOPSY TRANSRECTAL ULTRASONIC PROSTATE (TUBP);  Surgeon: Alexis Frock, MD;  Location: [facility];  Service: Urology;  Laterality: N/A;        Home Medications    Prior to Admission medications   Medication Sig Start Date End Date Taking? Authorizing Provider  acetaminophen (TYLENOL) 500 MG tablet Take 500 mg by mouth every 6 (six) hours as needed for headache (pain).     [provider]  albuterol (PROVENTIL HFA;VENTOLIN HFA) 108 (90 Base) MCG/ACT inhaler Inhale 2 puffs into the lungs every 6 (six) hours as needed for wheezing or shortness of breath.    [provider]  aspirin 81 MG chewable tablet Chew 1 tablet (81 mg total) by mouth daily. Patient not taking: Reported on 10/16/2017 11/27/16   Fredia Sorrow, MD  aspirin EC 81 MG tablet Take 81 mg by mouth daily as needed (chest pain).    [provider]  benzonatate (TESSALON) 100 MG capsule Take 1 capsule (100 mg total) by mouth every 8 (eight) hours. Patient not taking: Reported on 12/08/2017 11/14/17   Varney Biles, MD  dextromethorphan-guaiFENesin (MUCINEX DM) 30-600 MG 12hr tablet Take 1 tablet by mouth 2 (two) times daily. Patient not taking: Reported on 12/08/2017 11/14/17   Varney Biles, MD  doxycycline (VIBRAMYCIN) 100 MG capsule Take 1 capsule (100 mg total) by mouth 2 (two) times daily. One po bid x 7 days 12/08/17   Orlie Dakin, MD  guaiFENesin (MUCINEX PO) Take 1 tablet by mouth 2 (two) times daily as needed (congestion).    [provider]  Multiple Vitamin (MULTIVITAMIN WITH MINERALS) TABS tablet Take 1 tablet by mouth daily.    [provider]  OVER THE COUNTER MEDICATION Take 1 tablet by mouth 2 (two) times daily. Super Beta  Prostate supplement    [provider]  OVER THE COUNTER MEDICATION Apply 1 application topically daily as needed (arthritis pain).    [provider]  sildenafil (REVATIO) 20 MG tablet Take 60 mg by mouth daily as needed (erectile dysfunction).  09/24/17   [provider]  traMADol (ULTRAM) 50 MG tablet Take 1 tablet (50 mg total) by mouth every 6 (six) hours as needed. Patient not taking: Reported on 12/08/2017 11/29/17   Milton Ferguson, MD    Family History Family History  Problem Relation Age of Onset  . Heart disease Brother   . Colon cancer Brother   . Heart disease Sister   . Cancer Sister        unknown  . Cancer Mother        unknown    Social History Social History   Tobacco Use  . Smoking status: Former Smoker    Packs/day: 1.50    Years: 20.00    Pack years: 30.00    Types: Cigarettes    Last attempt to quit: 07/23/1987    Years since quitting: 30.4  . Smokeless tobacco: Former Systems developer    Types: Snuff    Quit date: 07/23/1987  Substance Use Topics  . Alcohol use: No    Frequency: Never  . Drug use: No     Allergies   Patient has no known allergies.   Review of Systems Review of Systems  Respiratory: Positive for cough and shortness of breath.   All other systems reviewed and are negative.    Physical Exam Updated Vital Signs BP (!) 131/92   Pulse 96   Temp 99.6 F (37.6 C) (Oral)   Resp (!) 28   Ht 5\' 4"  (1.626 m)   Wt 49.9 kg (110 lb)   SpO2 97%   BMI 18.88 kg/m   Physical Exam  Constitutional: He is oriented to person, place, and time. He appears well-developed and well-nourished.  Very thin  HENT:  Head: Normocephalic and atraumatic.  Right Ear: Tympanic membrane and ear canal normal.  Left Ear: Tympanic membrane and ear canal normal.  Nose: Nose normal.  Mouth/Throat: Uvula is midline, oropharynx is clear and moist and mucous membranes are normal.  Eyes: Pupils are equal, round, and reactive to light.  Conjunctivae and EOM are normal.  Neck: Normal range of motion.  Cardiovascular: Normal rate, regular rhythm and normal heart sounds.  Pulmonary/Chest: Effort normal. He has decreased breath sounds. He has no wheezes. He has no rales.  Breath sounds are diminished throughout, able to speak in full sentences  Abdominal: Soft. Bowel sounds are normal.  Musculoskeletal: Normal range of motion.  Neurological: He is alert and oriented to person, place, and time.  Skin: Skin is warm and dry.  Psychiatric: He has a normal mood and  affect.  Nursing note and vitals reviewed.    ED Treatments / Results  Labs (all labs ordered are listed, but only abnormal results are displayed) Labs Reviewed  BASIC METABOLIC PANEL - Abnormal; Notable for the following components:      Result Value   Sodium 132 (*)    Chloride 98 (*)    Calcium 8.8 (*)    All other components within normal limits  CBC - Abnormal; Notable for the following components:   RBC 3.91 (*)    Hemoglobin 10.6 (*)    HCT 31.6 (*)    Platelets 459 (*)    All other components within normal limits  CULTURE, BLOOD (ROUTINE X 2)  CULTURE, BLOOD (ROUTINE X 2)  RAPID HIV SCREEN (HIV 1/2 AB+AG)  I-STAT TROPONIN, ED  I-STAT TROPONIN, ED    EKG EKG Interpretation  Date/Time:  Monday December 16 2017 19:11:15 EDT Ventricular Rate:  122 PR Interval:  122 QRS Duration: 82 QT Interval:  328 QTC Calculation: 467 R Axis:   -42 Text Interpretation:  Sinus tachycardia Right atrial enlargement Left axis deviation Pulmonary disease pattern Nonspecific T wave abnormality Confirmed by Dory Horn) on 12/17/2017 12:24:02 AM   Radiology Dg Chest 2 View  Result Date: 12/16/2017 CLINICAL DATA:  Productive cough for 1 week.  Chest pain. EXAM: CHEST - 2 VIEW COMPARISON:  Chest CT and chest radiograph on 12/08/2017. FINDINGS: Heart size is normal. Mild mediastinal lymphadenopathy in the right superior paratracheal region shows no significant  change. Multiple small irregular nodular densities are again seen bilaterally, with upper lobe predominance. One of these nodules in the lateral right lung base shows cystic lucency which may represent cavitation. No evidence of pleural effusion. IMPRESSION: No significant change in multiple ill-defined pulmonary nodular opacities with upper lobe predominance and right paratracheal lymphadenopathy. These findings are suspicious for infectious etiology, possibly fungal pneumonia, tuberculosis, or septic emboli. Electronically Signed   By: Earle Gell M.D.   On: 12/16/2017 20:26    Procedures Procedures (including critical care time)  Medications Ordered in ED Medications - No data to display   Initial Impression / Assessment and Plan / ED Course  I have reviewed the triage vital signs and the nursing notes.  Pertinent labs & imaging results that were available during my care of the patient were reviewed by me and considered in my medical decision making (see chart for details).  67 year old male here with continued cough and shortness of breath.  Was seen in the ED recently diagnosed with pneumonia.  Completed full course of doxycycline without much improvement.  He is afebrile and nontoxic, but does appear overall unwell.  Labs reassuring.  Chest x-ray today largely unchanged from prior, still concerning for possible fungal pneumonia, TB versus septic emboli.  He had a CT angio during last ED visit that was negative for PE.  Acid-fast bacilli was negative.  He did have a sputum culture which grew out pseudohyphae and yeast.  After discussion with pharmacy, recommended to start eraxis.  Will send rapid HIV, blood cultures.  Will admit to hospital for ongoing management.  Spoke with Dr. Hal Hope-- he will admit for ongoing care.  Will double cover with vanc/cefepime as well-- has been ordered.  Final Clinical Impressions(s) / ED Diagnoses   Final diagnoses:  Fungal pneumonia    ED Discharge  Orders    None       Larene Pickett, PA-C 12/17/17 Gerarda Fraction, April, MD 12/17/17  [place], April, MD 12/17/17 [id number]

## 2017-12-17 NOTE — ED Notes (Signed)
ED Provider at bedside. 

## 2017-12-18 DIAGNOSIS — Z8546 Personal history of malignant neoplasm of prostate: Secondary | ICD-10-CM

## 2017-12-18 LAB — ACID FAST SMEAR (AFB)

## 2017-12-18 LAB — LEGIONELLA PNEUMOPHILA SEROGP 1 UR AG: L. pneumophila Serogp 1 Ur Ag: NEGATIVE

## 2017-12-18 LAB — SEDIMENTATION RATE: SED RATE: 108 mm/h — AB (ref 0–16)

## 2017-12-18 LAB — C-REACTIVE PROTEIN: CRP: 15.3 mg/dL — ABNORMAL HIGH (ref <1.0)

## 2017-12-18 LAB — LACTATE DEHYDROGENASE: LDH: 103 U/L (ref 98–192)

## 2017-12-18 LAB — HISTOPLASMA ANTIGEN, URINE

## 2017-12-18 LAB — ACID FAST SMEAR (AFB, MYCOBACTERIA)
Acid Fast Smear: NEGATIVE
Acid Fast Smear: NEGATIVE

## 2017-12-18 LAB — PROTIME-INR
INR: 1.15
Prothrombin Time: 14.6 seconds (ref 11.4–15.2)

## 2017-12-18 NOTE — Progress Notes (Signed)
PROGRESS NOTE    [name]  JOA:[id number] DOB: 21-Apr-1951 DOA: 12/16/2017 PCP: Bernerd Limbo, MD   Brief Narrative: Patient is a 67 year old male with history of coronary artery disease, asthma,prostate cancer who presented to the emergency room about a week ago for URI type symptoms.  He was diagnosed with pneumonia, he was given doxycycline to take for 7 days.  Prior to discharge from the ED he underwent sputum cultures.  EDP at that time discussed with infectious disease over the phone.  Patient went home, and continued to feel sick it despite antibiotics and decided to come back to the hospital.  He is experiencing progressive shortness of breath as well as an unrelenting cough which is occasionally productive but mostly dry.  Sputum cultures from prior ED visit showed pseudohyphae and yeast.  Chest imaging also shows multiple lung nodules of unknown etiology which seem to have progressed.  Patient admitted for further management.  Assessment & Plan:   Principal Problem:   Lung nodules Active Problems:   Prostate cancer ([place])   CAP (community acquired pneumonia)   Normocytic normochromic anemia   Weight loss   Pneumonia - Fungal pneumonia versus tuberculosis versus malignancy -Chest imaging shows stable multiple lung nodules which seem to have progressed.  Need tissue diagnosis. -ID consulted.Planned bronchoscopy  . Pulmonology consulted.  Since patient has multiple small to large nodules that are quite peripheral pulmonology recommends FNA of the largest nodule. -IR will be consulted.  Specimen will be sent to microbiology lab for AFB culture, fungal culture, bacterial culture and also pathology -AFB smear negative. Sent  histoplasma, Aspergillus, cryptococcus, Legionella.Will follow up. -Currently on azithromycin. -Continue droplet precaution as per ID. -Strep pneumo was negative -HIV was negative  History of asthma -Presently not wheezing.  Respiratory status  stable  History of obstructive coronary artery disease -No chest pain, no cardiac symptoms  Normocytic anemia -Anemia panel pending  Prostate cancer: Diagnosed with prostate cancer apparently 1 year ago .  PET scan done in December did not show any evidence of metastasis. He has Stage T1c adenocarcinoma of the prostate with Gleason Score of 4+4.  Follows with urology.  As per the last urology note, patient was reluctant to move forward with treatment for prostate cancer due to risks for erectile dysfunction. He was last seen by urology on 10/16/17, Dr. Tammi Klippel.       DVT prophylaxis: SCD Code Status: Full Family Communication: None present at the bedside Disposition Plan: To be determined  Consultants: ID, pulmonology  Procedures: None  Antimicrobials: Azithromycin since 12/17/17  Subjective: Patient seen and examined the bedside this morning.  Remains comfortable.  Respiratory status is stable.  No overnight fever, nausea or vomiting.  Objective: Vitals:   12/17/17 2131 12/17/17 2301 12/18/17 0641 12/18/17 1402  BP: (!) 139/93  (!) 138/95 (!) 151/98  Pulse: 79 88 91 90  Resp: (!) 29 (!) 23 (!) 23 (!) 29  Temp: 98.9 F (37.2 C)  98.9 F (37.2 C) 98.8 F (37.1 C)  TempSrc: Oral  Oral Oral  SpO2: 100% 98% 100% 93%  Weight:   48.4 kg (106 lb 12.8 oz)   Height:        Intake/Output Summary (Last 24 hours) at 12/18/2017 1651 Last data filed at 12/18/2017 1100 Gross per 24 hour  Intake 175 ml  Output 550 ml  Net -375 ml   Filed Weights   12/16/17 1915 12/17/17 0839 12/18/17 0641  Weight: 49.9 kg (110 lb) 49.3 kg (108  lb 11.2 oz) 48.4 kg (106 lb 12.8 oz)    Examination:  General exam: Appears calm and comfortable ,Not in distress,average built HEENT:PERRL,Oral mucosa moist, Ear/Nose normal on gross exam Respiratory system: Bilateral equal air entry, normal vesicular breath sounds, no wheezes or crackles  Cardiovascular system: S1 & S2 heard, RRR. No JVD, murmurs,  rubs, gallops or clicks. No pedal edema. Gastrointestinal system: Abdomen is nondistended, soft and nontender. No organomegaly or masses felt. Normal bowel sounds heard. Central nervous system: Alert and oriented. No focal neurological deficits. Extremities: No edema, no clubbing ,no cyanosis, distal peripheral pulses palpable. Skin: No rashes, lesions or ulcers,no icterus ,no pallor MSK: Normal muscle bulk,tone ,power Psychiatry: Judgement and insight appear normal. Mood & affect appropriate.     Data Reviewed: I have personally reviewed following labs and imaging studies  CBC: Recent Labs  Lab 12/16/17 1934 12/17/17 0345  WBC 8.3 8.2  HGB 10.6* 10.4*  HCT 31.6* 31.5*  MCV 80.8 80.8  PLT 459* 009*   Basic Metabolic Panel: Recent Labs  Lab 12/16/17 1934 12/17/17 0345  NA 132* 134*  K 3.6 4.1  CL 98* 99*  CO2 24 25  GLUCOSE 93 105*  BUN 10 10  CREATININE 0.95 0.87  CALCIUM 8.8* 8.9   GFR: Estimated Creatinine Clearance: 56.4 mL/min (by C-G formula based on SCr of 0.87 mg/dL). Liver Function Tests: No results for input(s): AST, ALT, ALKPHOS, BILITOT, PROT, ALBUMIN in the last 168 hours. No results for input(s): LIPASE, AMYLASE in the last 168 hours. No results for input(s): AMMONIA in the last 168 hours. Coagulation Profile: No results for input(s): INR, PROTIME in the last 168 hours. Cardiac Enzymes: No results for input(s): CKTOTAL, CKMB, CKMBINDEX, TROPONINI in the last 168 hours. BNP (last 3 results) No results for input(s): PROBNP in the last 8760 hours. HbA1C: No results for input(s): HGBA1C in the last 72 hours. CBG: No results for input(s): GLUCAP in the last 168 hours. Lipid Profile: No results for input(s): CHOL, HDL, LDLCALC, TRIG, CHOLHDL, LDLDIRECT in the last 72 hours. Thyroid Function Tests: No results for input(s): TSH, T4TOTAL, FREET4, T3FREE, THYROIDAB in the last 72 hours. Anemia Panel: No results for input(s): VITAMINB12, FOLATE, FERRITIN,  TIBC, IRON, RETICCTPCT in the last 72 hours. Sepsis Labs: No results for input(s): PROCALCITON, LATICACIDVEN in the last 168 hours.  Recent Results (from the past 240 hour(s))  Acid Fast Smear (AFB)     Status: None   Collection Time: 12/08/17  5:08 PM  Result Value Ref Range Status   AFB Specimen Processing Concentration  Final   Acid Fast Smear Negative  Final    Comment: (NOTE) Performed At: [facility] [place], [place] [id number] Rush Farmer MD [id number]    Source (AFB) EXPECTORATED SPUTUM  Final    Comment: Performed at [facility], [place] [address] [place], [place] [address]  Fungus Culture With Stain     Status: None (Preliminary result)   Collection Time: 12/08/17  5:08 PM  Result Value Ref Range Status   Fungus Stain Final report  Final    Comment: (NOTE) Performed At: [facility] [place], [place] [id number] Rush Farmer MD [id number]    Fungus (Mycology) Culture PENDING  Incomplete   Fungal Source EXPECTORATED SPUTUM  Final    Comment: Performed at [facility], [place] [address], [place],  [address]  Fungus Culture Result     Status: None   Collection Time:  12/08/17  5:08 PM  Result Value Ref Range Status   Result 1 Comment  Final    Comment: (NOTE) Pseudohyphae and yeasts observed Performed At: [facility] [place], [place] [id number] Rush Farmer MD [id number] Performed at [facility], [place] [address] [place], [place] [address]   Blood culture (routine x 2)     Status: None (Preliminary result)   Collection Time: 12/17/17  1:00 AM  Result Value Ref Range Status   Specimen Description BLOOD LEFT ANTECUBITAL  Final   Special Requests   Final    BOTTLES DRAWN AEROBIC AND ANAEROBIC Blood Culture adequate volume   Culture   Final    NO GROWTH 1 DAY Performed at [facility], [place] [address] [place], [place] [address]    Report Status PENDING   Incomplete  Blood culture (routine x 2)     Status: None (Preliminary result)   Collection Time: 12/17/17  1:10 AM  Result Value Ref Range Status   Specimen Description BLOOD RIGHT WRIST  Final   Special Requests   Final    BOTTLES DRAWN AEROBIC AND ANAEROBIC Blood Culture results may not be optimal due to an inadequate volume of blood received in culture bottles   Culture   Final    NO GROWTH 1 DAY Performed at [facility], [place] [address], [place], [place] [address]    Report Status PENDING  Incomplete  Culture, sputum-assessment     Status: None   Collection Time: 12/17/17  8:10 AM  Result Value Ref Range Status   Specimen Description SPUTUM  Final   Special Requests NONE  Final   Sputum evaluation   Final    THIS SPECIMEN IS ACCEPTABLE FOR SPUTUM CULTURE Performed at [facility], [address], [place], [place] [address]    Report Status 12/17/2017 FINAL  Final  Culture, respiratory (NON-Expectorated)     Status: None (Preliminary result)   Collection Time: 12/17/17  8:10 AM  Result Value Ref Range Status   Specimen Description SPUTUM  Final   Special Requests NONE Reflexed from [id number]  Final   Gram Stain   Final    FEW WBC PRESENT, PREDOMINANTLY PMN FEW GRAM POSITIVE COCCI IN PAIRS RARE GRAM NEGATIVE RODS RARE YEAST RARE GRAM NEGATIVE COCCOBACILLI    Culture   Final    CULTURE REINCUBATED FOR BETTER GROWTH Performed at [facility], [place] [address], [place], [place] [address]    Report Status PENDING  Incomplete  Acid Fast Smear (AFB)     Status: None   Collection Time: 12/17/17  8:12 AM  Result Value Ref Range Status   AFB Specimen Processing Concentration  Final   Acid Fast Smear Negative  Final    Comment: (NOTE) Performed At: [facility] [place], [place] [id number] Rush Farmer MD [id number]    Source (AFB) SPUTUM  Final    Comment: Performed at [facility], [place] [address] [place], [place] [address]    Acid Fast Smear (AFB)     Status: None   Collection Time: 12/17/17  3:13 PM  Result Value Ref Range Status   AFB Specimen Processing Concentration  Final   Acid Fast Smear Negative  Final    Comment: (NOTE) Performed At: [facility] [place], [place] [id number] Rush Farmer MD [id number]    Source (AFB) SPUTUM  Final  Radiology Studies: Dg Chest 2 View  Result Date: 12/16/2017 CLINICAL DATA:  Productive cough for 1 week.  Chest pain. EXAM: CHEST - 2 VIEW COMPARISON:  Chest CT and chest radiograph on 12/08/2017. FINDINGS: Heart size is normal. Mild mediastinal lymphadenopathy in the right superior paratracheal region shows no significant change. Multiple small irregular nodular densities are again seen bilaterally, with upper lobe predominance. One of these nodules in the lateral right lung base shows cystic lucency which may represent cavitation. No evidence of pleural effusion. IMPRESSION: No significant change in multiple ill-defined pulmonary nodular opacities with upper lobe predominance and right paratracheal lymphadenopathy. These findings are suspicious for infectious etiology, possibly fungal pneumonia, tuberculosis, or septic emboli. Electronically Signed   By: Earle Gell M.D.   On: 12/16/2017 20:26        Scheduled Meds: Continuous Infusions: . azithromycin Stopped (12/18/17 0540)     LOS: 1 day    Time spent: More than 50% of that time was spent in counseling and/or coordination of care.      Shelly Coss, MD [facility] Pager [phone]  If 7PM-7AM, please contact night-coverage [email or web address] Password [facility] 12/18/2017, 4:51 PM

## 2017-12-18 NOTE — Consult Note (Signed)
[facility]  PULMONARY / CRITICAL CARE MEDICINE   Name: [name] MRN: [id number] DOB: December 23, 1950    ADMISSION DATE:  12/16/2017 CONSULTATION::Internal medicine  CHIEF COMPLAINT: persistent cough,sob, weight loss  HISTORY OF PRESENT ILLNESS:    I  This is a 67 year old male with history of coronary artery disease, asthma, who presented to the emergency room about a week ago for URI type symptoms.  He was diagnosed with pneumonia, he was given doxycycline to take for 7 days.  Prior to discharge from the ED he underwent sputum cultures.  EDP at that time discussed with infectious disease over the phone.  Patient went home, and continued to feel sick it despite antibiotics and decided to come back to the hospital.  He is experiencing progressive shortness of breath as well as an unrelenting cough which is occasionally productive but mostly dry.  Sputum cultures from prior ED visit showed pseudohyphae and yeast.   .  He has not followed consistently with primary care.  He was diagnosed with prostate cancer apparently 1 year ago but it held off on therapy.  He did have a PET scan done in December which did not show evidence of any malignancy outside the prostate itself.  Then in March she came in with increasing dyspnea and cough.  CT scan had shown multiple nodules throughout the lungs.  Sputum's were taken for AFB which were smear negative and culture negative to date fungal smear and culture was obtained as well on 31 March and showed pseudohyphae and yeast. He had been dc on doxycyline and followup in our clinic arranged.  He continued to have worsening cough and productive sputum. He returned to ER and lung nodules have worsened. He was started overnight on antibacterial and antifungal therapy which I have since DC.  I he appears underweight and is anxious.   He also has no evidence of other unusual zoonotic exposures such as exposure to farm animals or pets or birds.  He was incarcerated many  decades ago in jail for some period of time but other than that was never else in jail.  He has never traveled outside the [place] and is hardly traveled much in the [place] having grown up in [place] and lived in [place].  He has not been exposed to anyone who has tuberculosis.  He has not known to have an autoimmune disease.  4/10 I was asked to see the patient because it appears that he will need a some sort of invasive procedure to make a firm diagnosis. He has been afebbrile to low grade. WBC is within normal limits. I reviewed his CT scan. He has multiple peripheral based pleural noodules of various size. He does not have a lot of infiltrative parenchymal findings. PAST MEDICAL HISTORY :  He  has a past medical history of BPH with urinary obstruction, CAD (coronary artery disease), COPD (chronic obstructive pulmonary disease) ([place]), DDD (degenerative disc disease), cervical, Diverticulosis of colon, Elevated PSA, GERD (gastroesophageal reflux disease), History of adenomatous polyp of colon, History of hypertension, History of precordial chest pain, Prostate cancer ([place]), and Wears glasses.  PAST SURGICAL HISTORY: He  has a past surgical history that includes Colonoscopy (last one 12-14-2015); Cardiac catheterization (01-08-2003   dr Lyndel Safe); and Prostate biopsy (N/A, 07/25/2017).  No Known Allergies  No current facility-administered medications on file prior to encounter.    Current Outpatient Medications on File Prior to Encounter  Medication Sig  . acetaminophen (TYLENOL) 500 MG tablet  Take 500 mg by mouth every 6 (six) hours as needed for headache (pain).   Marland Kitchen albuterol (PROVENTIL HFA;VENTOLIN HFA) 108 (90 Base) MCG/ACT inhaler Inhale 2 puffs into the lungs every 6 (six) hours as needed for wheezing or shortness of breath.  Marland Kitchen aspirin EC 81 MG tablet Take 81 mg by mouth daily as needed (chest pain).  Marland Kitchen guaiFENesin (MUCINEX PO) Take 1 tablet by mouth 2 (two)  times daily as needed (congestion).  . Multiple Vitamin (MULTIVITAMIN WITH MINERALS) TABS tablet Take 1 tablet by mouth daily.  Marland Kitchen OVER THE COUNTER MEDICATION Take 1 tablet by mouth 2 (two) times daily. Super Beta Prostate supplement  . OVER THE COUNTER MEDICATION Apply 1 application topically daily as needed (arthritis pain).  . sildenafil (REVATIO) 20 MG tablet Take 60 mg by mouth daily as needed (erectile dysfunction).   Marland Kitchen aspirin 81 MG chewable tablet Chew 1 tablet (81 mg total) by mouth daily. (Patient not taking: Reported on 10/16/2017)  . benzonatate (TESSALON) 100 MG capsule Take 1 capsule (100 mg total) by mouth every 8 (eight) hours. (Patient not taking: Reported on 12/08/2017)  . dextromethorphan-guaiFENesin (MUCINEX DM) 30-600 MG 12hr tablet Take 1 tablet by mouth 2 (two) times daily. (Patient not taking: Reported on 12/08/2017)  . doxycycline (VIBRAMYCIN) 100 MG capsule Take 1 capsule (100 mg total) by mouth 2 (two) times daily. One po bid x 7 days (Patient not taking: Reported on 12/17/2017)  . traMADol (ULTRAM) 50 MG tablet Take 1 tablet (50 mg total) by mouth every 6 (six) hours as needed. (Patient not taking: Reported on 12/08/2017)    FAMILY HISTORY:  His indicated that his mother is deceased. He indicated that his sister is deceased. He indicated that his brother is deceased.   SOCIAL HISTORY: He  reports that he quit smoking about 30 years ago. His smoking use included cigarettes. He has a 30.00 pack-year smoking history. He quit smokeless tobacco use about 30 years ago. His smokeless tobacco use included snuff. He reports that he does not drink alcohol or use drugs.  REVIEW OF SYSTEMS:    Review of Systems  Constitutional: Positive for diaphoresis and malaise/fatigue. Negative for chills and fever.  Respiratory: Positive for cough and shortness of breath. Negative for hemoptysis and sputum production.   Cardiovascular: Positive for chest pain and orthopnea.  Gastrointestinal:  Negative for heartburn and vomiting.  Genitourinary: Negative for dysuria.  Musculoskeletal: Negative for myalgias.  Neurological: Negative for dizziness and focal weakness.  Psychiatric/Behavioral: Negative for depression.        VITAL SIGNS: BP (!) 138/95 (BP Location: Left Arm)   Pulse 91   Temp 98.9 F (37.2 C) (Oral)   Resp (!) 23   Ht 5\' 4"  (1.626 m)   Wt 106 lb 12.8 oz (48.4 kg)   SpO2 100%   BMI 18.33 kg/m        INTAKE / OUTPUT: I/O last 3 completed shifts: In: 55 [P.O.:680; IV Piggyback:550] Out: 850 [Urine:850]  PHYSICAL EXAMINATION: General:  Patient is in no acute distress,  Neuro:  A and O x 3, moving all extr.  HEENT: [place]/AT, Emily;earae anitcteric Cardiovascular:  [id number] Lungs: bilateral BS, s wheeze or rales Abdomen:  Soft , BS , non-tender, no gross organomegaly Extr; s c.c/3    LABS:  BMET Recent Labs  Lab 12/16/17 1934 12/17/17 0345  NA 132* 134*  K 3.6 4.1  CL 98* 99*  CO2 24 25  BUN 10 10  CREATININE 0.95  0.87  GLUCOSE 93 105*    Electrolytes Recent Labs  Lab 12/16/17 1934 12/17/17 0345  CALCIUM 8.8* 8.9    CBC Recent Labs  Lab 12/16/17 1934 12/17/17 0345  WBC 8.3 8.2  HGB 10.6* 10.4*  HCT 31.6* 31.5*  PLT 459* 465*    Coag's No results for input(s): APTT, INR in the last 168 hours.  Sepsis Markers No results for input(s): LATICACIDVEN, PROCALCITON, O2SATVEN in the last 168 hours.  ABG No results for input(s): PHART, PCO2ART, PO2ART in the last 168 hours.  Liver Enzymes No results for input(s): AST, ALT, ALKPHOS, BILITOT, ALBUMIN in the last 168 hours.  Cardiac Enzymes No results for input(s): TROPONINI, PROBNP in the last 168 hours.  Glucose No results for input(s): GLUCAP in the last 168 hours.  Imaging No results found.        CULTURES: AFB smears x 2 negative AFB culture pending Expectorated sputum 4/9 few GPCS, few WBCS ANTIBIOTICS: azithromycin    ASSESSMENT / PLAN:  PULMONARY  This  patient has multiple small-large nodules that are quote peripheral  There is alarger pleural based nioodule in the RUL measuring about 1.9 cm. There is some shoddy mediastinal and right paratracheal adenopathy as well. The patient has an undisclosed weright loss in the recent past as well.  I would favor a FNA of the largest nodule in preference ot a bronchoscopy. Ther is not a lot iof infiltrate in the RUL . i think this may be the easist and best way to make a diagnosis. Theres nodulaes areeperipheral and don't lend themselves to bronchoscopic biopsy etc., However this larger nodule abuts the chest wall which would make it relatively safe. IR would perform theprcedure I spoke to the patient about this and he woll consider it. I doubt the patient as TB with 2 negative AFB smear. This could represent some endemic fungal disease or a malignancy I believe. I sharred my thought swith the ID NP as well. The patient is now considering it.     Micheal Likens MD Pulmonary and [place] Pager: [phone]  12/18/2017, 1:53 PM

## 2017-12-18 NOTE — Progress Notes (Signed)
Subjective: No new complaints   Antibiotics:  Anti-infectives (From admission, onward)   Start     Dose/Rate Route Frequency Ordered Stop   12/18/17 0200  anidulafungin (ERAXIS) 100 mg in sodium chloride 0.9 % 100 mL IVPB  Status:  Discontinued     100 mg 78 mL/hr over 100 Minutes Intravenous Every 24 hours 12/17/17 0047 12/17/17 0957   12/17/17 0800  cefTRIAXone (ROCEPHIN) 1 g in sodium chloride 0.9 % 100 mL IVPB  Status:  Discontinued     1 g 200 mL/hr over 30 Minutes Intravenous Every 24 hours 12/17/17 0314 12/17/17 0957   12/17/17 0400  azithromycin (ZITHROMAX) 500 mg in sodium chloride 0.9 % 250 mL IVPB     500 mg 250 mL/hr over 60 Minutes Intravenous Every 24 hours 12/17/17 0314 12/24/17 0359   12/17/17 0200  anidulafungin (ERAXIS) 200 mg in sodium chloride 0.9 % 200 mL IVPB     200 mg 78 mL/hr over 200 Minutes Intravenous  Once 12/17/17 0047 12/17/17 0730   12/17/17 0200  ceFEPIme (MAXIPIME) 2 g in sodium chloride 0.9 % 100 mL IVPB     2 g 200 mL/hr over 30 Minutes Intravenous  Once 12/17/17 0138 12/17/17 0235   12/17/17 0200  vancomycin (VANCOCIN) IVPB 1000 mg/200 mL premix  Status:  Discontinued     1,000 mg 200 mL/hr over 60 Minutes Intravenous  Once 12/17/17 0138 12/17/17 0314      Medications: Scheduled Meds: . enoxaparin (LOVENOX) injection  40 mg Subcutaneous Q24H   Continuous Infusions: . azithromycin Stopped (12/18/17 0540)   PRN Meds:.acetaminophen **OR** acetaminophen, albuterol, aspirin EC, ondansetron **OR** ondansetron (ZOFRAN) IV    Objective: Weight change: -1 lb 4.8 oz (-0.59 kg)  Intake/Output Summary (Last 24 hours) at 12/18/2017 1524 Last data filed at 12/18/2017 1100 Gross per 24 hour  Intake 415 ml  Output 550 ml  Net -135 ml   Blood pressure (!) 151/98, pulse 90, temperature 98.8 F (37.1 C), temperature source Oral, resp. rate (!) 29, height 5\' 4"  (1.626 m), weight 106 lb 12.8 oz (48.4 kg), SpO2 93 %. Temp:  [98.8 F (37.1  C)-98.9 F (37.2 C)] 98.8 F (37.1 C) (04/10 1402) Pulse Rate:  [79-91] 90 (04/10 1402) Resp:  [23-29] 29 (04/10 1402) BP: (138-151)/(93-98) 151/98 (04/10 1402) SpO2:  [93 %-100 %] 93 % (04/10 1402) Weight:  [106 lb 12.8 oz (48.4 kg)] 106 lb 12.8 oz (48.4 kg) (04/10 0641)  Physical Exam: General: Alert and awake, oriented x3,  HEENT: anicteric sclera, pupils reactive to light and accommodation, EOMI CVS regular rate, normal r,  no murmur rubs or gallops Chest: fairly clear to auscultation bilaterally, no wheezing, rales or rhonchi Abdomen: soft nontender, nondistended, normal bowel sounds, Extremities: no  clubbing or edema noted bilaterally Skin: no rashes Neuro: nonfocal  CBC: CBC Latest Ref Rng & Units 12/17/2017 12/16/2017 12/08/2017  WBC 4.0 - 10.5 K/uL 8.2 8.3 -  Hemoglobin 13.0 - 17.0 g/dL 10.4(L) 10.6(L) 12.2(L)  Hematocrit 39.0 - 52.0 % 31.5(L) 31.6(L) 36.0(L)  Platelets 150 - 400 K/uL 465(H) 459(H) -      BMET Recent Labs    12/16/17 1934 12/17/17 0345  NA 132* 134*  K 3.6 4.1  CL 98* 99*  CO2 24 25  GLUCOSE 93 105*  BUN 10 10  CREATININE 0.95 0.87  CALCIUM 8.8* 8.9     Liver Panel  No results for input(s): PROT, ALBUMIN, AST, ALT, ALKPHOS, BILITOT,  BILIDIR, IBILI in the last 72 hours.     Sedimentation Rate Recent Labs    12/18/17 0351  ESRSEDRATE 108*   C-Reactive Protein Recent Labs    12/18/17 0351  CRP 15.3*    Micro Results: Recent Results (from the past 720 hour(s))  Acid Fast Smear (AFB)     Status: None   Collection Time: 12/08/17  5:08 PM  Result Value Ref Range Status   AFB Specimen Processing Concentration  Final   Acid Fast Smear Negative  Final    Comment: (NOTE) Performed At: [facility] [address] [place], [place] [id number] Rush Farmer MD OA:[id number]    Source (AFB) EXPECTORATED SPUTUM  Final    Comment: Performed at [facility], [place] [address] [place], [place] [address]  Fungus Culture  With Stain     Status: None (Preliminary result)   Collection Time: 12/08/17  5:08 PM  Result Value Ref Range Status   Fungus Stain Final report  Final    Comment: (NOTE) Performed At: [facility] [place], [place] [id number] Rush Farmer MD [id number]    Fungus (Mycology) Culture PENDING  Incomplete   Fungal Source EXPECTORATED SPUTUM  Final    Comment: Performed at [facility], [place] [address] [place], [place] [address]  Fungus Culture Result     Status: None   Collection Time: 12/08/17  5:08 PM  Result Value Ref Range Status   Result 1 Comment  Final    Comment: (NOTE) Pseudohyphae and yeasts observed Performed At: [facility] [place], [place] [id number] Rush Farmer MD [id number] Performed at [facility], [place] [address], [place], [place] [address]   Culture, sputum-assessment     Status: None   Collection Time: 12/17/17  8:10 AM  Result Value Ref Range Status   Specimen Description SPUTUM  Final   Special Requests NONE  Final   Sputum evaluation   Final    THIS SPECIMEN IS ACCEPTABLE FOR SPUTUM CULTURE Performed at [facility], [address], [place], [place] [address]    Report Status 12/17/2017 FINAL  Final  Culture, respiratory (NON-Expectorated)     Status: None (Preliminary result)   Collection Time: 12/17/17  8:10 AM  Result Value Ref Range Status   Specimen Description SPUTUM  Final   Special Requests NONE Reflexed from [phone]  Final   Gram Stain   Final    FEW WBC PRESENT, PREDOMINANTLY PMN FEW GRAM POSITIVE COCCI IN PAIRS RARE GRAM NEGATIVE RODS RARE YEAST RARE GRAM NEGATIVE COCCOBACILLI    Culture   Final    CULTURE REINCUBATED FOR BETTER GROWTH Performed at [facility], [place] [address], [place], [place] [address]    Report Status PENDING  Incomplete  Acid Fast Smear (AFB)     Status: None   Collection Time: 12/17/17  8:12 AM  Result Value Ref Range Status   AFB  Specimen Processing Concentration  Final   Acid Fast Smear Negative  Final    Comment: (NOTE) Performed At: [facility] [place], [place] [id number] Rush Farmer MD [id number]    Source (AFB) SPUTUM  Final    Comment: Performed at [facility], [place] [address] [place], [place] [address]  Acid Fast Smear (AFB)     Status: None   Collection Time: 12/17/17  3:13 PM  Result Value Ref Range Status   AFB Specimen Processing Concentration  Final   Acid Fast Smear Negative  Final    Comment: (NOTE) Performed At: [facility] [place], [place] [id number] Rush Farmer MD [id number]:[id number]    Source (AFB) SPUTUM  Final    Studies/Results: Dg Chest 2 View  Result Date: 12/16/2017 CLINICAL DATA:  Productive cough for 1 week.  Chest pain. EXAM: CHEST - 2 VIEW COMPARISON:  Chest CT and chest radiograph on 12/08/2017. FINDINGS: Heart size is normal. Mild mediastinal lymphadenopathy in the right superior paratracheal region shows no significant change. Multiple small irregular nodular densities are again seen bilaterally, with upper lobe predominance. One of these nodules in the lateral right lung base shows cystic lucency which may represent cavitation. No evidence of pleural effusion. IMPRESSION: No significant change in multiple ill-defined pulmonary nodular opacities with upper lobe predominance and right paratracheal lymphadenopathy. These findings are suspicious for infectious etiology, possibly fungal pneumonia, tuberculosis, or septic emboli. Electronically Signed   By: Earle Gell M.D.   On: 12/16/2017 20:26      Assessment/Plan:  INTERVAL HISTORY: CCM consulted and lesions not able to endobronchial approach   Principal Problem:   Lung nodules Active Problems:   Prostate cancer ([place])   CAP (community acquired pneumonia)   Normocytic normochromic anemia   Weight loss    Jacy Howat is a 67 y.o. male with history of prostate  cancer who was found to have multiple lung nodules of unknown etiology that have progressed.  We will needed tissue diagnosis.  Pulmonary are unable to get at these lesions via the bronchoscope therefore we will  interventional radiology for IR guided aspirate.  NOte Tissue should be sent  SEPARATELY   #1 In sterile container WITHOUT formalin to MICROBIOLOGY LAB for AFB culture and Fungal culture, bacterial cultures  #2 in appropriate different container to pathology    LOS: 1 day   Alcide Evener 12/18/2017, 3:24 PM

## 2017-12-19 ENCOUNTER — Inpatient Hospital Stay (HOSPITAL_COMMUNITY): Payer: Medicare Other

## 2017-12-19 ENCOUNTER — Encounter (HOSPITAL_COMMUNITY): Payer: Self-pay | Admitting: Radiology

## 2017-12-19 DIAGNOSIS — J17 Pneumonia in diseases classified elsewhere: Secondary | ICD-10-CM

## 2017-12-19 DIAGNOSIS — R05 Cough: Secondary | ICD-10-CM

## 2017-12-19 DIAGNOSIS — R0902 Hypoxemia: Secondary | ICD-10-CM

## 2017-12-19 DIAGNOSIS — R911 Solitary pulmonary nodule: Secondary | ICD-10-CM

## 2017-12-19 DIAGNOSIS — F419 Anxiety disorder, unspecified: Secondary | ICD-10-CM

## 2017-12-19 DIAGNOSIS — B49 Unspecified mycosis: Secondary | ICD-10-CM

## 2017-12-19 LAB — CULTURE, RESPIRATORY

## 2017-12-19 LAB — CULTURE, RESPIRATORY W GRAM STAIN: Culture: NORMAL

## 2017-12-19 LAB — ANA: Anti Nuclear Antibody(ANA): NEGATIVE

## 2017-12-19 LAB — ASPERGILLUS ANTIGEN, BAL/SERUM: ASPERGILLUS AG, BAL/SERUM: 0.05 {index} (ref 0.00–0.49)

## 2017-12-19 MED ORDER — MIDAZOLAM HCL 2 MG/2ML IJ SOLN
INTRAMUSCULAR | Status: AC | PRN
  Administered 2017-12-19: 0.5 mg via INTRAVENOUS
  Administered 2017-12-19: 1 mg via INTRAVENOUS
  Administered 2017-12-19: 0.5 mg via INTRAVENOUS

## 2017-12-19 MED ORDER — PNEUMOCOCCAL VAC POLYVALENT 25 MCG/0.5ML IJ INJ
0.5000 mL | INJECTION | INTRAMUSCULAR | Status: AC
Start: 2017-12-20 — End: 2017-12-21
  Administered 2017-12-21: 0.5 mL via INTRAMUSCULAR
  Filled 2017-12-19: qty 0.5

## 2017-12-19 MED ORDER — FENTANYL CITRATE (PF) 100 MCG/2ML IJ SOLN
INTRAMUSCULAR | Status: AC
  Filled 2017-12-19: qty 4

## 2017-12-19 MED ORDER — MIDAZOLAM HCL 2 MG/2ML IJ SOLN
INTRAMUSCULAR | Status: AC
  Filled 2017-12-19: qty 4

## 2017-12-19 MED ORDER — FENTANYL CITRATE (PF) 100 MCG/2ML IJ SOLN
INTRAMUSCULAR | Status: AC | PRN
  Administered 2017-12-19: 50 ug via INTRAVENOUS
  Administered 2017-12-19 (×2): 25 ug via INTRAVENOUS

## 2017-12-19 MED ORDER — LIDOCAINE HCL 1 % IJ SOLN
INTRAMUSCULAR | Status: AC
  Filled 2017-12-19: qty 20

## 2017-12-19 NOTE — Progress Notes (Signed)
[facility] for Infectious Disease  Date of Admission:  12/16/2017             ASSESSMENT/PLAN  [name] has had a cough for about 1 month now with multiple pulmonary nodular foci with concern for TB, septic emboli, or fungal infection. It is unlikely TB as he has had 3 negative AFB smears with cultures pending and minimal risk factors. Scheduled for fine needle aspiration to identify these nodules. Pulmonary indicated no benefit from a bronchoscopy at this time. Will continue to monitor off of therapy pending fine needle aspiration results. Consider removing airborne precautions pending culture results. He was reassured that these steps are necessary to help with diagnosis and treatment.   1. Continue to monitor off therapy. 2. Monitor culture results from fine needle aspiration.    Principal Problem:   Lung nodules Active Problems:   Prostate cancer ([place])   CAP (community acquired pneumonia)   Normocytic normochromic anemia   Weight loss   . [START ON 12/20/2017] pneumococcal 23 valent vaccine  0.5 mL Intramuscular Tomorrow-1000    SUBJECTIVE:  Afebrile overnight. Denies chills or night sweats. Seen by pulmonology yesterday with the recommendation for fine needle aspiration by IR. Initially did not want the procedure but he has consented to completing the procedure. He is not on antimicrobial therapy presently. Expresses anxiety regarding the procedure. Continues to have a cough.   No Known Allergies   Review of Systems: Review of Systems  Constitutional: Negative for chills, diaphoresis and fever.  Respiratory: Positive for cough. Negative for hemoptysis, shortness of breath and wheezing.   Cardiovascular: Negative for chest pain.  Gastrointestinal: Negative for abdominal pain, constipation, diarrhea, nausea and vomiting.  Skin: Negative for rash.      OBJECTIVE: Vitals:   12/19/17 0550 12/19/17 0607 12/19/17 0609 12/19/17 0612  BP: (!) 153/98 (!) 156/101  (!) 164/106 (!) 147/105  Pulse: (!) 111 89 92 97  Resp: (!) 29 (!) 28 (!) 27 (!) 27  Temp: 99.5 F (37.5 C)     TempSrc: Oral     SpO2: 99% 97% 95% 97%  Weight:      Height:       Body mass index is 18.5 kg/m.  Physical Exam  Constitutional: He is oriented to person, place, and time. No distress.  Cardiovascular: Normal rate, regular rhythm, normal heart sounds and intact distal pulses. Exam reveals no gallop and no friction rub.  No murmur heard. Pulmonary/Chest: Effort normal and breath sounds normal. No stridor. No respiratory distress. He has no wheezes. He has no rales. He exhibits no tenderness.  Abdominal: Soft. Bowel sounds are normal. He exhibits no distension.  Neurological: He is alert and oriented to person, place, and time.  Skin: Skin is warm and dry. No rash noted.  Psychiatric: His mood appears anxious.    Lab Results Lab Results  Component Value Date   WBC 8.2 12/17/2017   HGB 10.4 (L) 12/17/2017   HCT 31.5 (L) 12/17/2017   MCV 80.8 12/17/2017   PLT 465 (H) 12/17/2017    Lab Results  Component Value Date   CREATININE 0.87 12/17/2017   BUN 10 12/17/2017   NA 134 (L) 12/17/2017   K 4.1 12/17/2017   CL 99 (L) 12/17/2017   CO2 25 12/17/2017   No results found for: ALT, AST, GGT, ALKPHOS, BILITOT   Microbiology: Recent Results (from the past 240 hour(s))  Blood culture (routine x 2)     Status: None (  Preliminary result)   Collection Time: 12/17/17  1:00 AM  Result Value Ref Range Status   Specimen Description BLOOD LEFT ANTECUBITAL  Final   Special Requests   Final    BOTTLES DRAWN AEROBIC AND ANAEROBIC Blood Culture adequate volume   Culture   Final    NO GROWTH 1 DAY Performed at [facility], [address], [place], [place] [address]    Report Status PENDING  Incomplete  Blood culture (routine x 2)     Status: None (Preliminary result)   Collection Time: 12/17/17  1:10 AM  Result Value Ref Range Status   Specimen Description BLOOD RIGHT  WRIST  Final   Special Requests   Final    BOTTLES DRAWN AEROBIC AND ANAEROBIC Blood Culture results may not be optimal due to an inadequate volume of blood received in culture bottles   Culture   Final    NO GROWTH 1 DAY Performed at [facility], [place] [address], [place], [place] [address]    Report Status PENDING  Incomplete  Culture, sputum-assessment     Status: None   Collection Time: 12/17/17  8:10 AM  Result Value Ref Range Status   Specimen Description SPUTUM  Final   Special Requests NONE  Final   Sputum evaluation   Final    THIS SPECIMEN IS ACCEPTABLE FOR SPUTUM CULTURE Performed at [facility], [address], [place], [place] [address]    Report Status 12/17/2017 FINAL  Final  Culture, respiratory (NON-Expectorated)     Status: None (Preliminary result)   Collection Time: 12/17/17  8:10 AM  Result Value Ref Range Status   Specimen Description SPUTUM  Final   Special Requests NONE Reflexed from [phone]  Final   Gram Stain   Final    FEW WBC PRESENT, PREDOMINANTLY PMN FEW GRAM POSITIVE COCCI IN PAIRS RARE GRAM NEGATIVE RODS RARE YEAST RARE GRAM NEGATIVE COCCOBACILLI    Culture   Final    CULTURE REINCUBATED FOR BETTER GROWTH Performed at [facility], [place] [address], [place],  [address]    Report Status PENDING  Incomplete  Acid Fast Smear (AFB)     Status: None   Collection Time: 12/17/17  8:12 AM  Result Value Ref Range Status   AFB Specimen Processing Concentration  Final   Acid Fast Smear Negative  Final    Comment: (NOTE) Performed At: [facility] [place], [place] [id number] Rush Farmer MD [id number]    Source (AFB) SPUTUM  Final    Comment: Performed at [facility], [place] [address], [place], [place] [address]  Acid Fast Smear (AFB)     Status: None   Collection Time: 12/17/17  3:13 PM  Result Value Ref Range Status   AFB Specimen Processing Concentration  Final   Acid Fast Smear Negative   Final    Comment: (NOTE) Performed At: [facility] [address] [place], [place] [id number] Rush Farmer MD [id number]    Source (AFB) SPUTUM  Final     Terri Piedra, NP [place] [facility] [phone] Pager  12/19/2017  12:27 PM

## 2017-12-19 NOTE — Progress Notes (Signed)
Chief Complaint: Patient was seen in consultation today for pulmonary nodules at the request of Dr. Micheal Likens  Referring Physician(s): Dr. Micheal Likens  Supervising Physician: Markus Daft  Patient Status: [facility] - In-pt  History of Present Illness: [name] is a 67 y.o. male being worked up for multiple pulmonary nodules. Both ID and Pulmonary following, lab studies not diagnostic yet. Tissue biopsy is requested. IR is asked to perform image guided biopsy. Chart, meds, labs, imaging reviewed. Has been NPO in anticipation of procedure. Girlfriend now at bedside.  Past Medical History:  Diagnosis Date  . BPH with urinary obstruction   . CAD (coronary artery disease)    per cardiac cath 01-08-2003  non-obstructive mild cad w/ normal LVSF  . COPD (chronic obstructive pulmonary disease) ([place])    per pulmologist note in 2009  GOLD 1  . DDD (degenerative disc disease), cervical    C5-7, C7-T1  . Diverticulosis of colon   . Elevated PSA   . GERD (gastroesophageal reflux disease)   . History of adenomatous polyp of colon   . History of hypertension   . History of precordial chest pain    as of 07-22-2017  denies cardiac S&S  . Prostate cancer ([place])   . Wears glasses     Past Surgical History:  Procedure Laterality Date  . CARDIAC CATHETERIZATION  01-08-2003   dr Lyndel Safe   mild non-obstructive CAD,  normal LVSF, ef 55% (positive myoview)  . COLONOSCOPY  last one 12-14-2015  . PROSTATE BIOPSY N/A 07/25/2017   Procedure: BIOPSY TRANSRECTAL ULTRASONIC PROSTATE (TUBP);  Surgeon: Alexis Frock, MD;  Location: [facility];  Service: Urology;  Laterality: N/A;    Allergies: Patient has no known allergies.  Medications:  Current Facility-Administered Medications:  .  acetaminophen (TYLENOL) tablet 650 mg, 650 mg, Oral, Q6H PRN, 650 mg at 12/19/17 0605 **OR** acetaminophen (TYLENOL) suppository 650 mg, 650 mg, Rectal, Q6H PRN, Rise Patience, MD .  albuterol (PROVENTIL) (2.5 MG/3ML) 0.083% nebulizer solution 2.5 mg, 2.5 mg, Nebulization, Q2H PRN, Rise Patience, MD .  aspirin EC tablet 81 mg, 81 mg, Oral, Daily PRN, Rise Patience, MD .  azithromycin (ZITHROMAX) 500 mg in sodium chloride 0.9 % 250 mL IVPB, 500 mg, Intravenous, Q24H, Rise Patience, MD, Stopped at 12/19/17 0511 .  ondansetron (ZOFRAN) tablet 4 mg, 4 mg, Oral, Q6H PRN **OR** ondansetron (ZOFRAN) injection 4 mg, 4 mg, Intravenous, Q6H PRN, Rise Patience, MD    Family History  Problem Relation Age of Onset  . Heart disease Brother   . Colon cancer Brother   . Heart disease Sister   . Cancer Sister        unknown  . Cancer Mother        unknown    Social History   Socioeconomic History  . Marital status: Widowed    Spouse name: Not on file  . Number of children: 2  . Years of education: Not on file  . Highest education level: Not on file  Occupational History  . Occupation: Custodian    Employer: [employer]  . Occupation: retired    Comment: 2015  Social Needs  . Financial resource strain: Not on file  . Food insecurity:    Worry: Not on file    Inability: Not on file  . Transportation needs:    Medical: Not on file    Non-medical: Not on file  Tobacco Use  . Smoking  status: Former Smoker    Packs/day: 1.50    Years: 20.00    Pack years: 30.00    Types: Cigarettes    Last attempt to quit: 07/23/1987    Years since quitting: 30.4  . Smokeless tobacco: Former Systems developer    Types: Snuff    Quit date: 07/23/1987  Substance and Sexual Activity  . Alcohol use: No    Frequency: Never  . Drug use: No  . Sexual activity: Not Currently  Lifestyle  . Physical activity:    Days per week: Not on file    Minutes per session: Not on file  . Stress: Not on file  Relationships  . Social connections:    Talks on phone: Not on file    Gets together: Not on file    Attends religious service: Not on file    Active  member of club or organization: Not on file    Attends meetings of clubs or organizations: Not on file    Relationship status: Not on file  Other Topics Concern  . Not on file  Social History Narrative  . Not on file     Review of Systems: A 12 point ROS discussed and pertinent positives are indicated in the HPI above.  All other systems are negative.  Review of Systems  Vital Signs: BP (!) 147/105 (BP Location: Left Arm)   Pulse 97   Temp 99.5 F (37.5 C) (Oral)   Resp (!) 27   Ht 5\' 4"  (1.626 m)   Wt 107 lb 12.8 oz (48.9 kg)   SpO2 97%   BMI 18.50 kg/m   Physical Exam  Constitutional: He is oriented to person, place, and time. He appears well-developed. No distress.  HENT:  Head: Normocephalic.  Mouth/Throat: Oropharynx is clear and moist.  Neck: Normal range of motion. No JVD present. No tracheal deviation present.  Cardiovascular: Normal rate, regular rhythm and normal heart sounds.  Pulmonary/Chest: Effort normal and breath sounds normal. No respiratory distress.  Neurological: He is alert and oriented to person, place, and time.  Skin: Skin is warm and dry.  Psychiatric: He has a normal mood and affect.    Imaging: Dg Chest 2 View  Result Date: 12/16/2017 CLINICAL DATA:  Productive cough for 1 week.  Chest pain. EXAM: CHEST - 2 VIEW COMPARISON:  Chest CT and chest radiograph on 12/08/2017. FINDINGS: Heart size is normal. Mild mediastinal lymphadenopathy in the right superior paratracheal region shows no significant change. Multiple small irregular nodular densities are again seen bilaterally, with upper lobe predominance. One of these nodules in the lateral right lung base shows cystic lucency which may represent cavitation. No evidence of pleural effusion. IMPRESSION: No significant change in multiple ill-defined pulmonary nodular opacities with upper lobe predominance and right paratracheal lymphadenopathy. These findings are suspicious for infectious etiology,  possibly fungal pneumonia, tuberculosis, or septic emboli. Electronically Signed   By: Earle Gell M.D.   On: 12/16/2017 20:26   Dg Chest 2 View  Result Date: 12/08/2017 CLINICAL DATA:  Cough and congestion for 2 weeks with earache and right rib pain. EXAM: CHEST - 2 VIEW COMPARISON:  11/14/2017 FINDINGS: Reticulonodular densities in the right more than left upper lobes that are newly seen. A apical subpleural opacity closely associated with the right lateral fourth rib was likely present in retrospect, indeterminate. There is a background of airway thickening. No edema, effusion, or pneumothorax. Normal heart size. IMPRESSION: Right more than left reticular and nodular opacities with rapid  development/progression favoring infectious or inflammatory process. After convalescence, if these nodular areas persist on radiography a chest CT is recommended to evaluate for metastatic disease in this patient with history of high-grade prostate carcinoma. Electronically Signed   By: Monte Fantasia M.D.   On: 12/08/2017 10:35   Ct Head Wo Contrast  Result Date: 11/29/2017 CLINICAL DATA:  Posterior neck pain and headaches for months increased in severity over past 5 days, history prostate cancer, COPD, coronary artery disease, hypertension EXAM: CT HEAD WITHOUT CONTRAST CT CERVICAL SPINE WITHOUT CONTRAST TECHNIQUE: Multidetector CT imaging of the head and cervical spine was performed following the standard protocol without intravenous contrast. Multiplanar CT image reconstructions of the cervical spine were also generated. COMPARISON:  12/15/2012 CT head; correlation chest radiograph 11/14/2017 FINDINGS: CT HEAD FINDINGS Brain: Asymmetry of lateral ventricles LEFT larger than RIGHT unchanged. No midline shift or mass effect. Small vessel chronic ischemic changes of deep cerebral white matter. No intracranial hemorrhage, mass lesion, or evidence of acute infarction. No extra-axial fluid collections. Vascular:  Atherosclerotic calcification of internal carotid and vertebral arteries at skull base Skull: Intact Sinuses/Orbits: Clear Other: N/A CT CERVICAL SPINE FINDINGS Alignment: Minimal anterolisthesis at C4-C5. Remaining alignment normal. Skull base and vertebrae: Skull base intact. Degenerative disc disease changes at C5-C6, C6-C7 and C7-T1 with disc space narrowing and endplate spur formation. Vertebral body heights maintained. No fracture, subluxation or bone destruction. Scattered facet degenerative changes. Soft tissues and spinal canal: Prevertebral soft tissues normal thickness. Disc levels: Minimal endplate spur formation and bulging disc. Spinal canal patent. Upper chest: Pleural thickening and question pleural nodularity at RIGHT apex, not identified on most recent chest radiograph. LEFT apex clear. 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 Small vessel chronic ischemic changes of deep cerebral white matter. No acute intracranial abnormalities. Degenerative disc and facet disease changes of the cervical spine with minimal anterolisthesis at C4-C5. No acute cervical spine abnormalities. Pleural thickening and nodularity at RIGHT apex of uncertain etiology; CT chest with contrast recommended for further evaluation. Electronically Signed   By: Lavonia Dana M.D.   On: 11/29/2017 18:01   Ct Angio Chest Pe W And/or Wo Contrast  Result Date: 12/08/2017 CLINICAL DATA:  Cough.  Congestion.  Prostate cancer.  COPD. EXAM: CT ANGIOGRAPHY CHEST WITH CONTRAST TECHNIQUE: Multidetector CT imaging of the chest was performed using the standard protocol during bolus administration of intravenous contrast. Multiplanar CT image reconstructions and MIPs were obtained to evaluate the vascular anatomy. CONTRAST:  13mL ISOVUE-370 IOPAMIDOL (ISOVUE-370) INJECTION 76% COMPARISON:  Chest radiograph from earlier today. FINDINGS: Cardiovascular: The study is high quality for the evaluation of  pulmonary embolism. There are no filling defects in the central, lobar, segmental or subsegmental pulmonary artery branches to suggest acute pulmonary embolism. Great vessels are normal in course and caliber. Normal heart size. No significant pericardial fluid/thickening. Mediastinum/Nodes: No discrete thyroid nodules. Patulous thoracic esophagus. Mildly enlarged 1.1 cm subcarinal node (series 6/image 74). Mild bilateral hilar adenopathy measuring up to 1.0 cm (series 6/image 67 on the right and image 74 on the left). No axillary adenopathy. Lungs/Pleura: No pneumothorax. No pleural effusion. Mild centrilobular emphysema with diffuse bronchial wall thickening. There are numerous poorly marginated nodular foci of consolidation scattered throughout both lungs, predominantly involving the peripheral lungs, asymmetrically involving the right lung. Representative peripheral right upper lobe 1.9 cm nodular focus of consolidation (series 7/image 44) and representative inferior right middle lobe 1.8 cm nodular focus of consolidation (series 7/image 114), which  is new since 08/26/2017 CT abdomen study. Upper abdomen: Simple 1.7 cm right liver lobe cyst. Musculoskeletal:  No aggressive appearing focal osseous lesions. Review of the MIP images confirms the above findings. IMPRESSION: 1. No pulmonary embolism. 2. Numerous poorly marginated nodular foci of consolidation throughout the lungs, predominantly in the peripheral lungs, asymmetrically involving the right lung, which appear new/increasing since 08/26/2017 CT abdomen study and 11/14/2017 chest radiograph. This rapid onset favors an infectious etiology. Consider atypical pneumonia such as fungal pneumonia. 3. Mild mediastinal and bilateral hilar lymphadenopathy, nonspecific. 4. Recommend follow-up post treatment chest CT with IV contrast in 3 months to reassess these findings. Emphysema (ICD10-J43.9). Electronically Signed   By: Ilona Sorrel M.D.   On: 12/08/2017 16:32    Ct Cervical Spine Wo Contrast  Result Date: 11/29/2017 CLINICAL DATA:  Posterior neck pain and headaches for months increased in severity over past 5 days, history prostate cancer, COPD, coronary artery disease, hypertension EXAM: CT HEAD WITHOUT CONTRAST CT CERVICAL SPINE WITHOUT CONTRAST TECHNIQUE: Multidetector CT imaging of the head and cervical spine was performed following the standard protocol without intravenous contrast. Multiplanar CT image reconstructions of the cervical spine were also generated. COMPARISON:  12/15/2012 CT head; correlation chest radiograph 11/14/2017 FINDINGS: CT HEAD FINDINGS Brain: Asymmetry of lateral ventricles LEFT larger than RIGHT unchanged. No midline shift or mass effect. Small vessel chronic ischemic changes of deep cerebral white matter. No intracranial hemorrhage, mass lesion, or evidence of acute infarction. No extra-axial fluid collections. Vascular: Atherosclerotic calcification of internal carotid and vertebral arteries at skull base Skull: Intact Sinuses/Orbits: Clear Other: N/A CT CERVICAL SPINE FINDINGS Alignment: Minimal anterolisthesis at C4-C5. Remaining alignment normal. Skull base and vertebrae: Skull base intact. Degenerative disc disease changes at C5-C6, C6-C7 and C7-T1 with disc space narrowing and endplate spur formation. Vertebral body heights maintained. No fracture, subluxation or bone destruction. Scattered facet degenerative changes. Soft tissues and spinal canal: Prevertebral soft tissues normal thickness. Disc levels: Minimal endplate spur formation and bulging disc. Spinal canal patent. Upper chest: Pleural thickening and question pleural nodularity at RIGHT apex, not identified on most recent chest radiograph. LEFT apex clear. 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 Small vessel chronic ischemic changes of deep cerebral white matter. No acute intracranial abnormalities. Degenerative disc and facet  disease changes of the cervical spine with minimal anterolisthesis at C4-C5. No acute cervical spine abnormalities. Pleural thickening and nodularity at RIGHT apex of uncertain etiology; CT chest with contrast recommended for further evaluation. Electronically Signed   By: Lavonia Dana M.D.   On: 11/29/2017 18:01    Labs:  CBC: Recent Labs    11/29/17 1923 12/08/17 1520 12/08/17 1535 12/16/17 1934 12/17/17 0345  WBC 7.6 6.8  --  8.3 8.2  HGB 13.0 11.0* 12.2* 10.6* 10.4*  HCT 38.5* 33.7* 36.0* 31.6* 31.5*  PLT 358 353  --  459* 465*    COAGS: Recent Labs    12/18/17 1830  INR 1.15    BMP: Recent Labs    12/27/16 1336  11/14/17 1318 12/08/17 1535 12/16/17 1934 12/17/17 0345  NA 138   < > 136 137 132* 134*  K 4.0   < > 4.0 4.3 3.6 4.1  CL 104   < > 100* 102 98* 99*  CO2 28  --  27  --  24 25  GLUCOSE 86   < > 103* 88 93 105*  BUN 7   < > <5* 9 10 10  CALCIUM 9.5  --  9.2  --  8.8* 8.9  CREATININE 1.01   < > 0.91 0.80 0.95 0.87  GFRNONAA >60  --  >60  --  >60 >60  GFRAA >60  --  >60  --  >60 >60   < > = values in this interval not displayed.    Assessment and Plan: Pulmonary nodules, possible infectious vs malignant etiology Imaging reviewed, amenable to CT guided biopsy of peripheral nodule. Labs ok Risks and benefits discussed with the patient including, but not limited to bleeding, hemoptysis, respiratory failure requiring intubation, infection, pneumothorax requiring chest tube placement, stroke from air embolism or even death. Patient is not agreeable to procedure at present time. He needs time to think it over. His girlfriend was helpful in supporting the need for tissue sampling. Will keep NPO until he reaches a decision in hopes procedure can be done later today.    Thank you for this interesting consult.  I greatly enjoyed meeting Hunter Bachar and look forward to participating in their care.  A copy of this report was sent to the requesting provider on  this date.  Electronically Signed: Ascencion Dike, PA-C 12/19/2017, 9:21 AM   I spent a total of 25 minutes in face to face in clinical consultation, greater than 50% of which was counseling/coordinating care for lung nodule biopsy

## 2017-12-19 NOTE — Procedures (Signed)
CT guided core biopsies of right upper lobe lesion.  2 cores obtained.  1 specimen for culture and 1 specimen for pathology.  Small amount of bleeding at biopsy site but no pneumothorax.  Plan for CXR in one hour.  Minimal blood loss and no immediate complication.

## 2017-12-19 NOTE — Progress Notes (Addendum)
PULMONARY / CRITICAL CARE MEDICINE   Name: [name] MRN: [id number] DOB: 30-Oct-1950    ADMISSION DATE:  12/16/2017 CONSULTATION::Internal medicine  CHIEF COMPLAINT: persistent cough,sob, weight loss  HISTORY OF PRESENT ILLNESS:    This is a 67 year old male with history of coronary artery disease, asthma, who presented to the emergency room about a week ago for URI type symptoms.  He was diagnosed with pneumonia, he was given doxycycline to take for 7 days.  Prior to discharge from the ED he underwent sputum cultures.  EDP at that time discussed with infectious disease over the phone.  Patient went home, and continued to feel sick it despite antibiotics and decided to come back to the hospital.  He is experiencing progressive shortness of breath as well as an unrelenting cough which is occasionally productive but mostly dry.  Sputum cultures from prior ED visit showed pseudohyphae and yeast.   .  He has not followed consistently with primary care.  He was diagnosed with prostate cancer apparently 1 year ago but it held off on therapy.  He did have a PET scan done in December which did not show evidence of any malignancy outside the prostate itself.  Then in March she came in with increasing dyspnea and cough.  CT scan had shown multiple nodules throughout the lungs.  Sputum's were taken for AFB which were smear negative and culture negative to date fungal smear and culture was obtained as well on 31 March and showed pseudohyphae and yeast. He had been dc on doxycyline and followup in our clinic arranged.  He continued to have worsening cough and productive sputum. He returned to ER and lung nodules have worsened. He was started overnight on antibacterial and antifungal therapy which I have since DC.  I he appears underweight and is anxious.   He also has no evidence of other unusual zoonotic exposures such as exposure to farm animals or pets or birds.  He was incarcerated many decades ago  in jail for some period of time but other than that was never else in jail.  He has never traveled outside the [place] and is hardly traveled much in the [place] having grown up in [place] and lived in [place].  He has not been exposed to anyone who has tuberculosis.  He has not known to have an autoimmune disease.  4/10 I was asked to see the patient because it appears that he will need a some sort of invasive procedure to make a firm diagnosis. He has been afebbrile to low grade. WBC is within normal limits. I reviewed his CT scan. He has multiple peripheral based pleural noodules of various size. He does not have a lot of infiltrative parenchymal findings.   Subjective: Feeling good today.    VITAL SIGNS: BP (!) 147/105 (BP Location: Left Arm)   Pulse 97   Temp 99.5 F (37.5 C) (Oral)   Resp (!) 27   Ht 5\' 4"  (1.626 m)   Wt 48.9 kg (107 lb 12.8 oz)   SpO2 97%   BMI 18.50 kg/m        INTAKE / OUTPUT: I/O last 3 completed shifts: In: 395 [P.O.:395] Out: 1275 [Urine:1275]  PHYSICAL EXAMINATION: General:  Thin black male in NAD Neuro:  A and O x 3, moving all extr.  HEENT: /AT, no JVD Cardiovascular:  [id number] Lungs: No distress, coarse bilateral Abdomen:  Soft , BS , non-tender, no gross organomegaly Extr:no edema.  LABS:  BMET Recent Labs  Lab 12/16/17 1934 12/17/17 0345  NA 132* 134*  K 3.6 4.1  CL 98* 99*  CO2 24 25  BUN 10 10  CREATININE 0.95 0.87  GLUCOSE 93 105*    Electrolytes Recent Labs  Lab 12/16/17 1934 12/17/17 0345  CALCIUM 8.8* 8.9    CBC Recent Labs  Lab 12/16/17 1934 12/17/17 0345  WBC 8.3 8.2  HGB 10.6* 10.4*  HCT 31.6* 31.5*  PLT 459* 465*    Coag's Recent Labs  Lab 12/18/17 1830  INR 1.15    Sepsis Markers No results for input(s): LATICACIDVEN, PROCALCITON, O2SATVEN in the last 168 hours.  ABG No results for input(s): PHART, PCO2ART, PO2ART in the last 168 hours.  Liver  Enzymes No results for input(s): AST, ALT, ALKPHOS, BILITOT, ALBUMIN in the last 168 hours.  Cardiac Enzymes No results for input(s): TROPONINI, PROBNP in the last 168 hours.  Glucose No results for input(s): GLUCAP in the last 168 hours.  Imaging No results found.        CULTURES: AFB smears x 2 negative AFB culture pending Expectorated sputum 4/9 few GPCS, few WBCS ANTIBIOTICS: azithromycin    ASSESSMENT / PLAN:  PULMONARY  Pulmonary nodules: DDx includes infection (potentiall TB? Although no contacts or true risk factors, but has had significant weight loss) and malignancy. - For FNA via IR.  - It seemed as though he would not want this procedure, but he has now agreed.   - Would be difficult to access this via bronchoscopy and would likely need navigational bronch if could not do FNA.   Pulmonary will sign off. Please call back if needed  Autumn Messing [facility] Pulmonology/Critical Care Pager [phone] or [phone]  12/19/2017 11:14 AM  Attending Note:  67 year old male with multiple pulmonary nodule.  PCCM consulted for bronchoscopy. On exam, lungs are clear.  I reviewed chest CT myself, multiple pulmonary nodules noted.  More approachable from a CT guided biopsy approach.  Discussed with PCCM-NP.  Pulmonary nodules:  - CT guided biopsy  Bronchitis:  - Azithromycin  - F/U on cultures  Hypoxemia:  - Titrate O2 for sat of 88-92%  PCCM will sign off, please call back if needed.  Patient seen and examined, agree with above note.  I dictated the care and orders written for this patient under my direction.  Rush Farmer, M.D. [facility] Pulmonary/Critical Care Medicine. Pager: [phone]. After hours pager: [phone].

## 2017-12-19 NOTE — Progress Notes (Signed)
Patient's BP readings showing 147/105, 164/106 and 156/101.  Sent text to MD on call Magdalene Patricia) to notify of readings.  Patient has been complaining of headaches, given Tylenol.  Will wait for MD orders and keep monitoring the patient.

## 2017-12-19 NOTE — Progress Notes (Signed)
PROGRESS NOTE    [name]  MHD:[id number] DOB: [birth year] DOA: 12/16/2017 PCP: Bernerd Limbo, MD   Brief Narrative: Patient is a 67 year old male with history of coronary artery disease, asthma,prostate cancer who presented to the emergency room about a week ago for URI type symptoms.  He was diagnosed with pneumonia, he was given doxycycline to take for 7 days.  Prior to discharge from the ED he underwent sputum cultures.  EDP at that time discussed with infectious disease over the phone.  Patient went home, and continued to feel sick it despite antibiotics and decided to come back to the hospital.  He was experiencing progressive shortness of breath as well as an unrelenting cough which is occasionally productive but mostly dry.  Sputum cultures from prior ED visit showed pseudohyphae and yeast.  Chest imaging also shows multiple lung nodules of unknown etiology which seem to have progressed.  Patient admitted for further management.  Assessment & Plan:   Principal Problem:   Pulmonary nodule, right Active Problems:   Prostate cancer ([place])   CAP (community acquired pneumonia)   Normocytic normochromic anemia   Weight loss   Fungal pneumonia   Pneumonia - Initial concern for Fungal pneumonia versus tuberculosis versus malignancy -Chest imaging shows stable multiple lung nodules which seem to have progressed.  Need tissue diagnosis. -ID consulted.Planned bronchoscopy  . Pulmonology consulted.  Since patient has multiple small to large nodules that are quite peripheral pulmonology recommends FNA of the largest nodule. -IR  Consulted.He is undergoing CT-guided biopsy today. Specimen will be sent to microbiology lab for AFB culture, fungal culture, bacterial culture and also pathology -AFB smear 3 times negative. Sent  antigens for histoplasma, Aspergillus, cryptococcus, Legionella- all negative Respiratory culture consistent with normal respiratory flora. Other cultures so far  negative -Currently on azithromycin. -Discontinued droplet precaution -Strep pneumo was negative -HIV was negative  History of asthma -Presently not wheezing.  Respiratory status stable  History of obstructive coronary artery disease -No chest pain, no cardiac symptoms  Normocytic anemia -Stable  Prostate cancer: Diagnosed with prostate cancer apparently 1 year ago .  PET scan done in December did not show any evidence of metastasis. He has Stage T1c adenocarcinoma of the prostate with Gleason Score of 4+4.  Follows with urology.  As per the last urology note, patient was reluctant to move forward with treatment for prostate cancer due to risks for erectile dysfunction. He was last seen by urology on 10/16/17, Dr. Tammi Klippel.    DVT prophylaxis: SCD Code Status: Full Family Communication: None present at the bedside Disposition Plan: To be determined  Consultants: ID, pulmonology  Procedures: None  Antimicrobials: Azithromycin since 12/17/17  Subjective: Patient seen and examined the bedside this morning.  Remains comfortable.  Respiratory status is stable.  No overnight fever, nausea or vomiting. Undergoing CT-guided lung nodule biopsy today  Objective: Vitals:   12/19/17 0607 12/19/17 0609 12/19/17 0612 12/19/17 1300  BP: (!) 156/101 (!) 164/106 (!) 147/105   Pulse: 89 92 97 91  Resp: (!) 28 (!) 27 (!) 27 (!) 21  Temp:    98 F (36.7 C)  TempSrc:    Oral  SpO2: 97% 95% 97%   Weight:      Height:        Intake/Output Summary (Last 24 hours) at 12/19/2017 1505 Last data filed at 12/19/2017 1300 Gross per 24 hour  Intake 220 ml  Output 855 ml  Net -635 ml   Filed Weights   12/17/17  0839 12/18/17 0641 12/18/17 2018  Weight: 49.3 kg (108 lb 11.2 oz) 48.4 kg (106 lb 12.8 oz) 48.9 kg (107 lb 12.8 oz)    Examination:  General exam: Appears calm and comfortable ,Not in distress,average built HEENT:PERRL,Oral mucosa moist, Ear/Nose normal on gross exam Respiratory  system: Bilateral equal air entry, normal vesicular breath sounds, no wheezes or crackles  Cardiovascular system: S1 & S2 heard, RRR. No JVD, murmurs, rubs, gallops or clicks. Gastrointestinal system: Abdomen is nondistended, soft and nontender. No organomegaly or masses felt. Normal bowel sounds heard. Central nervous system: Alert and oriented. No focal neurological deficits. Extremities: No edema, no clubbing ,no cyanosis, distal peripheral pulses palpable. Skin: No rashes, lesions or ulcers,no icterus ,no pallor MSK: Normal muscle bulk,tone ,power Psychiatry: Judgement and insight appear normal. Mood & affect appropriate.       Data Reviewed: I have personally reviewed following labs and imaging studies  CBC: Recent Labs  Lab 12/16/17 1934 12/17/17 0345  WBC 8.3 8.2  HGB 10.6* 10.4*  HCT 31.6* 31.5*  MCV 80.8 80.8  PLT 459* 790*   Basic Metabolic Panel: Recent Labs  Lab 12/16/17 1934 12/17/17 0345  NA 132* 134*  K 3.6 4.1  CL 98* 99*  CO2 24 25  GLUCOSE 93 105*  BUN 10 10  CREATININE 0.95 0.87  CALCIUM 8.8* 8.9   GFR: Estimated Creatinine Clearance: 57 mL/min (by C-G formula based on SCr of 0.87 mg/dL). Liver Function Tests: No results for input(s): AST, ALT, ALKPHOS, BILITOT, PROT, ALBUMIN in the last 168 hours. No results for input(s): LIPASE, AMYLASE in the last 168 hours. No results for input(s): AMMONIA in the last 168 hours. Coagulation Profile: Recent Labs  Lab 12/18/17 1830  INR 1.15   Cardiac Enzymes: No results for input(s): CKTOTAL, CKMB, CKMBINDEX, TROPONINI in the last 168 hours. BNP (last 3 results) No results for input(s): PROBNP in the last 8760 hours. HbA1C: No results for input(s): HGBA1C in the last 72 hours. CBG: No results for input(s): GLUCAP in the last 168 hours. Lipid Profile: No results for input(s): CHOL, HDL, LDLCALC, TRIG, CHOLHDL, LDLDIRECT in the last 72 hours. Thyroid Function Tests: No results for input(s): TSH,  T4TOTAL, FREET4, T3FREE, THYROIDAB in the last 72 hours. Anemia Panel: No results for input(s): VITAMINB12, FOLATE, FERRITIN, TIBC, IRON, RETICCTPCT in the last 72 hours. Sepsis Labs: No results for input(s): PROCALCITON, LATICACIDVEN in the last 168 hours.  Recent Results (from the past 240 hour(s))  Blood culture (routine x 2)     Status: None (Preliminary result)   Collection Time: 12/17/17  1:00 AM  Result Value Ref Range Status   Specimen Description BLOOD LEFT ANTECUBITAL  Final   Special Requests   Final    BOTTLES DRAWN AEROBIC AND ANAEROBIC Blood Culture adequate volume   Culture   Final    NO GROWTH 2 DAYS Performed at [facility], [address] [place], [place] [address]    Report Status PENDING  Incomplete  Blood culture (routine x 2)     Status: None (Preliminary result)   Collection Time: 12/17/17  1:10 AM  Result Value Ref Range Status   Specimen Description BLOOD RIGHT WRIST  Final   Special Requests   Final    BOTTLES DRAWN AEROBIC AND ANAEROBIC Blood Culture results may not be optimal due to an inadequate volume of blood received in culture bottles   Culture   Final    NO GROWTH 2 DAYS Performed at  [facility], [place] [address] [place], [place] [address]    Report Status PENDING  Incomplete  Aspergillus Ag, BAL/Serum     Status: None   Collection Time: 12/17/17  3:50 AM  Result Value Ref Range Status   Aspergillus Ag, BAL/Serum 0.05 0.00 - 0.49 Index Final    Comment: (NOTE) Performed At: [facility] [place], [place] [id number] Rush Farmer MD YB:[id number] Performed at [facility], [place] [address], [place], [place] [address]   Culture, sputum-assessment     Status: None   Collection Time: 12/17/17  8:10 AM  Result Value Ref Range Status   Specimen Description SPUTUM  Final   Special Requests NONE  Final   Sputum evaluation   Final    THIS SPECIMEN IS ACCEPTABLE FOR SPUTUM CULTURE Performed at [facility], [address], [place], [place] [address]    Report Status 12/17/2017 FINAL  Final  Culture, respiratory (NON-Expectorated)     Status: None   Collection Time: 12/17/17  8:10 AM  Result Value Ref Range Status   Specimen Description SPUTUM  Final   Special Requests NONE Reflexed from [phone]  Final   Gram Stain   Final    FEW WBC PRESENT, PREDOMINANTLY PMN FEW GRAM POSITIVE COCCI IN PAIRS RARE GRAM NEGATIVE RODS RARE YEAST RARE GRAM NEGATIVE COCCOBACILLI    Culture   Final    Consistent with normal respiratory flora. Performed at [facility], [place] [address], [place], [place] [address]    Report Status 12/19/2017 FINAL  Final  Acid Fast Smear (AFB)     Status: None   Collection Time: 12/17/17  8:12 AM  Result Value Ref Range Status   AFB Specimen Processing Concentration  Final   Acid Fast Smear Negative  Final    Comment: (NOTE) Performed At: [facility] [place], [place] [id number] Rush Farmer MD OI:[id number]    Source (AFB) SPUTUM  Final    Comment: Performed at [facility], [place] [address] [place], [place] [address]  Acid Fast Smear (AFB)     Status: None   Collection Time: 12/17/17  3:13 PM  Result Value Ref Range Status   AFB Specimen Processing Concentration  Final   Acid Fast Smear Negative  Final    Comment: (NOTE) Performed At: [facility] [place], [place] [id number] Rush Farmer MD LP:[id number]    Source (AFB) SPUTUM  Final         Radiology Studies: No results found.      Scheduled Meds: . [START ON 12/20/2017] pneumococcal 23 valent vaccine  0.5 mL Intramuscular Tomorrow-1000   Continuous Infusions: . azithromycin Stopped (12/19/17 0511)     LOS: 2 days    Time spent: 25 mins. More than 50% of that time was spent in counseling and/or coordination of care.   Please Note: This patient record was dictated using Editor, commissioning. Chart creation errors have been  sought, but may not always have been located. Such creation errors do not reflect on the Standard of Medical Care.     Shelly Coss, MD [facility] Pager [phone]  If 7PM-7AM, please contact night-coverage [email or web address] Password [facility] 12/19/2017, 3:05 PM

## 2017-12-20 DIAGNOSIS — B49 Unspecified mycosis: Secondary | ICD-10-CM

## 2017-12-20 DIAGNOSIS — R634 Abnormal weight loss: Secondary | ICD-10-CM

## 2017-12-20 DIAGNOSIS — Z9889 Other specified postprocedural states: Secondary | ICD-10-CM

## 2017-12-20 DIAGNOSIS — J17 Pneumonia in diseases classified elsewhere: Secondary | ICD-10-CM

## 2017-12-20 LAB — ANCA TITERS
Atypical P-ANCA titer: <1:20 {titer}
C-ANCA: 1:80 {titer} — ABNORMAL HIGH

## 2017-12-20 MED ORDER — GUAIFENESIN-DM 100-10 MG/5ML PO SYRP
5.0000 mL | ORAL_SOLUTION | ORAL | Status: DC | PRN
  Administered 2017-12-21 (×2): 5 mL via ORAL
  Filled 2017-12-20 (×2): qty 5

## 2017-12-20 MED ORDER — IBUPROFEN 600 MG PO TABS
600.0000 mg | ORAL_TABLET | Freq: Once | ORAL | Status: AC
  Administered 2017-12-20: 600 mg via ORAL
  Filled 2017-12-20: qty 1

## 2017-12-20 NOTE — Progress Notes (Signed)
PROGRESS NOTE    [name]  TXM:[id number] DOB: November 28, 1950 DOA: 12/16/2017 PCP: Bernerd Limbo, MD   Brief Narrative: Patient is a 67 year old male with history of coronary artery disease, asthma,prostate cancer who presented to the emergency room about a week ago for URI type symptoms.  He was diagnosed with pneumonia, he was given doxycycline to take for 7 days.  Prior to discharge from the ED he underwent sputum cultures.  EDP at that time discussed with infectious disease over the phone.  Patient went home, and continued to feel sick it despite antibiotics and decided to come back to the hospital.  He was experiencing progressive shortness of breath as well as an unrelenting cough which is occasionally productive but mostly dry.  Sputum cultures from prior ED visit showed pseudohyphae and yeast.  Chest imaging also shows multiple lung nodules of unknown etiology which seem to have progressed.  Patient admitted for further management.  Assessment & Plan:   Principal Problem:   Pulmonary nodule, right Active Problems:   Prostate cancer ([place])   CAP (community acquired pneumonia)   Normocytic normochromic anemia   Weight loss   Fungal pneumonia   Hypoxemia   Pneumonia - Initial concern for Fungal pneumonia versus tuberculosis versus malignancy -Chest imaging shows stable multiple lung nodules which seem to have progressed.  -ID consulted.Planned bronchoscopy  . Pulmonology consulted.  Since patient has multiple small to large nodules that are quite peripheral pulmonology recommended FNA of the largest nodule. -IR  Consulted.He  underwent  CT-guided biopsy of right upper lobe lesion on 12/19/17. Specimen was sent to microbiology lab for AFB culture, fungal culture, bacterial culture and also pathology. We will follow up the report. -AFB smear 3 times negative. Sent  antigens for histoplasma, Aspergillus, cryptococcus, Legionella- all negative Respiratory culture consistent with normal  respiratory flora. Other cultures so far negative -Was on azithromycin which has been D/Ced -Discontinued droplet precaution -Strep pneumo was negative -HIV was negative  History of asthma -Presently not wheezing.  Respiratory status stable  History of obstructive coronary artery disease -No chest pain, no cardiac symptoms  Normocytic anemia -Stable  Prostate cancer: Diagnosed with prostate cancer apparently 1 year ago .  PET scan done in December did not show any evidence of metastasis. He has Stage T1c adenocarcinoma of the prostate with Gleason Score of 4+4.  Follows with urology.  As per the last urology note, patient was reluctant to move forward with treatment for prostate cancer due to risks for erectile dysfunction. He was last seen by urology on 10/16/17, Dr. Tammi Klippel.    DVT prophylaxis: SCD Code Status: Full Family Communication: None present at the bedside Disposition Plan: To be determined  Consultants: ID, pulmonology  Procedures: None  Antimicrobials: Azithromycin since 12/17/17-12/20/17  Subjective: Patient seen and examined the bedside this morning.  Remains comfortable.  Respiratory status is stable.  No overnight fever, nausea or vomiting. Complains of cough Objective: Vitals:   12/19/17 2247 12/20/17 0028 12/20/17 0444 12/20/17 1347  BP: 137/88 132/87 (!) 141/84 (!) 138/94  Pulse: 88 92 90   Resp: (!) 24 (!) 25 (!) 21   Temp:  98.2 F (36.8 C) 98.6 F (37 C) 98.1 F (36.7 C)  TempSrc:  Oral Oral Oral  SpO2: 97% 100% 98%   Weight:   46.7 kg (103 lb)   Height:        Intake/Output Summary (Last 24 hours) at 12/20/2017 1457 Last data filed at 12/20/2017 1300 Gross per 24 hour  Intake 1450 ml  Output -  Net 1450 ml   Filed Weights   12/18/17 0641 12/18/17 2018 12/20/17 0444  Weight: 48.4 kg (106 lb 12.8 oz) 48.9 kg (107 lb 12.8 oz) 46.7 kg (103 lb)    Examination:  General exam: Appears calm and comfortable ,Not in distress,average  built HEENT:PERRL,Oral mucosa moist, Ear/Nose normal on gross exam Respiratory system: Bilateral equal air entry, normal vesicular breath sounds, no wheezes or crackles  Cardiovascular system: S1 & S2 heard, RRR. No JVD, murmurs, rubs, gallops or clicks. Gastrointestinal system: Abdomen is nondistended, soft and nontender. No organomegaly or masses felt. Normal bowel sounds heard. Central nervous system: Alert and oriented. No focal neurological deficits. Extremities: No edema, no clubbing ,no cyanosis, distal peripheral pulses palpable. Skin: No rashes, lesions or ulcers,no icterus ,no pallor MSK: Normal muscle bulk,tone ,power Psychiatry: Judgement and insight appear normal. Mood & affect appropriate.    Data Reviewed: I have personally reviewed following labs and imaging studies  CBC: Recent Labs  Lab 12/16/17 1934 12/17/17 0345  WBC 8.3 8.2  HGB 10.6* 10.4*  HCT 31.6* 31.5*  MCV 80.8 80.8  PLT 459* 376*   Basic Metabolic Panel: Recent Labs  Lab 12/16/17 1934 12/17/17 0345  NA 132* 134*  K 3.6 4.1  CL 98* 99*  CO2 24 25  GLUCOSE 93 105*  BUN 10 10  CREATININE 0.95 0.87  CALCIUM 8.8* 8.9   GFR: Estimated Creatinine Clearance: 54.4 mL/min (by C-G formula based on SCr of 0.87 mg/dL). Liver Function Tests: No results for input(s): AST, ALT, ALKPHOS, BILITOT, PROT, ALBUMIN in the last 168 hours. No results for input(s): LIPASE, AMYLASE in the last 168 hours. No results for input(s): AMMONIA in the last 168 hours. Coagulation Profile: Recent Labs  Lab 12/18/17 1830  INR 1.15   Cardiac Enzymes: No results for input(s): CKTOTAL, CKMB, CKMBINDEX, TROPONINI in the last 168 hours. BNP (last 3 results) No results for input(s): PROBNP in the last 8760 hours. HbA1C: No results for input(s): HGBA1C in the last 72 hours. CBG: No results for input(s): GLUCAP in the last 168 hours. Lipid Profile: No results for input(s): CHOL, HDL, LDLCALC, TRIG, CHOLHDL, LDLDIRECT in the  last 72 hours. Thyroid Function Tests: No results for input(s): TSH, T4TOTAL, FREET4, T3FREE, THYROIDAB in the last 72 hours. Anemia Panel: No results for input(s): VITAMINB12, FOLATE, FERRITIN, TIBC, IRON, RETICCTPCT in the last 72 hours. Sepsis Labs: No results for input(s): PROCALCITON, LATICACIDVEN in the last 168 hours.  Recent Results (from the past 240 hour(s))  Blood culture (routine x 2)     Status: None (Preliminary result)   Collection Time: 12/17/17  1:00 AM  Result Value Ref Range Status   Specimen Description BLOOD LEFT ANTECUBITAL  Final   Special Requests   Final    BOTTLES DRAWN AEROBIC AND ANAEROBIC Blood Culture adequate volume   Culture   Final    NO GROWTH 3 DAYS Performed at [facility], [address], [place], [place] [address]    Report Status PENDING  Incomplete  Blood culture (routine x 2)     Status: None (Preliminary result)   Collection Time: 12/17/17  1:10 AM  Result Value Ref Range Status   Specimen Description BLOOD RIGHT WRIST  Final   Special Requests   Final    BOTTLES DRAWN AEROBIC AND ANAEROBIC Blood Culture results may not be optimal due to an inadequate volume of blood received in culture bottles   Culture  Final    NO GROWTH 3 DAYS Performed at [facility], [place] [address], [place], [place] [address]    Report Status PENDING  Incomplete  Aspergillus Ag, BAL/Serum     Status: None   Collection Time: 12/17/17  3:50 AM  Result Value Ref Range Status   Aspergillus Ag, BAL/Serum 0.05 0.00 - 0.49 Index Final    Comment: (NOTE) Performed At: [facility] [place], [place] [id number] Rush Farmer MD YO:[id number] Performed At: [place], [place] [id number] Nechama Guard MD EH:[id number] Performed at [facility], [place] [address], [place], [place] [address]   Culture, sputum-assessment     Status: None   Collection Time: 12/17/17  8:10 AM  Result Value Ref Range  Status   Specimen Description SPUTUM  Final   Special Requests NONE  Final   Sputum evaluation   Final    THIS SPECIMEN IS ACCEPTABLE FOR SPUTUM CULTURE Performed at [facility], [address], [place], [place] [address]    Report Status 12/17/2017 FINAL  Final  Culture, respiratory (NON-Expectorated)     Status: None   Collection Time: 12/17/17  8:10 AM  Result Value Ref Range Status   Specimen Description SPUTUM  Final   Special Requests NONE Reflexed from [phone]  Final   Gram Stain   Final    FEW WBC PRESENT, PREDOMINANTLY PMN FEW GRAM POSITIVE COCCI IN PAIRS RARE GRAM NEGATIVE RODS RARE YEAST RARE GRAM NEGATIVE COCCOBACILLI    Culture   Final    Consistent with normal respiratory flora. Performed at [facility], [place] [address] [place], [place] [address]    Report Status 12/19/2017 FINAL  Final  Acid Fast Smear (AFB)     Status: None   Collection Time: 12/17/17  8:12 AM  Result Value Ref Range Status   AFB Specimen Processing Concentration  Final   Acid Fast Smear Negative  Final    Comment: (NOTE) Performed At: [facility] [place], [place] [id number] Rush Farmer MD CB:[id number]    Source (AFB) SPUTUM  Final    Comment: Performed at [facility], [place] [address], [place], [place] [address]  Acid Fast Smear (AFB)     Status: None   Collection Time: 12/17/17  3:13 PM  Result Value Ref Range Status   AFB Specimen Processing Concentration  Final   Acid Fast Smear Negative  Final    Comment: (NOTE) Performed At: [facility] [address] [place], [place] [id number] Rush Farmer MD [id number]    Source (AFB) SPUTUM  Final  Culture, fungus without smear     Status: None (Preliminary result)   Collection Time: 12/19/17  5:29 PM  Result Value Ref Range Status   Specimen Description TISSUE RIGHT UPPER LUNG  Final   Special Requests NONE  Final   Culture   Final    NO GROWTH < 24 HOURS Performed at [facility], [place] [address], [place],  [address]    Report Status PENDING  Incomplete  Aerobic/Anaerobic Culture (surgical/deep wound)     Status: None (Preliminary result)   Collection Time: 12/19/17  5:29 PM  Result Value Ref Range Status   Specimen Description TISSUE RIGHT UPPER LUNG  Final   Special Requests NONE  Final   Gram Stain   Final    FEW WBC PRESENT,BOTH PMN AND MONONUCLEAR NO ORGANISMS SEEN  Culture   Final    NO GROWTH < 24 HOURS Performed at [facility], [place] [address], [place], [place] [address]    Report Status PENDING  Incomplete         Radiology Studies: Ct Biopsy  Result Date: Jan 11, 2018 INDICATION: 67 year old with multiple pulmonary nodules. Request for a lung biopsy for culture and histology. EXAM: CT-GUIDED CORE BIOPSIES OF RIGHT PULMONARY NODULE MEDICATIONS: None. ANESTHESIA/SEDATION: Moderate (conscious) sedation was employed during this procedure. A total of Versed 2.0 mg and Fentanyl 100 mcg was administered intravenously. Moderate Sedation Time: 19 minutes. The patient's level of consciousness and vital signs were monitored continuously by radiology nursing throughout the procedure under my direct supervision. FLUOROSCOPY TIME:  None COMPLICATIONS: None immediate. PROCEDURE: Informed written consent was obtained from the patient after a thorough discussion of the procedural risks, benefits and alternatives. All questions were addressed. A timeout was performed prior to the initiation of the procedure. Patient was placed supine on the CT scanner. Images through the chest were obtained. A peripheral nodule in the right upper lobe was targeted. The right axillary region was prepped and draped in a sterile fashion. Skin and soft tissues were anesthetized with 1% lidocaine. 17 gauge coaxial needle was directed into the peripheral lesion with CT guidance. Two core biopsies were obtained with an 18 gauge core device. One specimen placed in saline  for culture and the other was placed in formalin. 17 gauge needle was removed without complication. Bandage placed over the puncture site. FINDINGS: Innumerable pulmonary nodules, predominantly along the pleural surfaces. Lesion in the lateral right upper lung was biopsied. Small amount of parenchymal hemorrhage following the core biopsies. Negative for pneumothorax. IMPRESSION: CT-guided core biopsies of right upper lobe pulmonary nodule. Electronically Signed   By: Markus Daft M.D.   On: 11-Jan-2018 18:36   Dg Chest Port 1 View  Result Date: Jan 11, 2018 CLINICAL DATA:  Status post lung biopsy. EXAM: PORTABLE CHEST 1 VIEW COMPARISON:  CT scan, same day. FINDINGS: No postprocedural pneumothorax is identified. Stable upper lung predominant nodularity. The cardiac silhouette, mediastinal and hilar contours are stable. No pleural effusion. IMPRESSION: No postprocedural pneumothorax. Electronically Signed   By: Marijo Sanes M.D.   On: 01/11/18 18:47        Scheduled Meds: . pneumococcal 23 valent vaccine  0.5 mL Intramuscular Tomorrow-1000   Continuous Infusions:    LOS: 3 days    Time spent: 25 mins. More than 50% of that time was spent in counseling and/or coordination of care.   Please Note: This patient record was dictated using Editor, commissioning. Chart creation errors have been sought, but may not always have been located. Such creation errors do not reflect on the Standard of Medical Care.     Shelly Coss, MD [facility] Pager [phone]  If 7PM-7AM, please contact night-coverage [email or web address] Password [facility] 12/20/2017, 2:57 PM

## 2017-12-20 NOTE — Progress Notes (Signed)
[facility]  Date of Admission:  12/16/2017          ASSESSMENT/PLAN  Mr. [name] continues to have a cough with multiple pulmonary nodules of undetermined source with questionable TB, fungal infection or septic emboli. Malignancy is also a possibility, although per patient most recent scan during prostate cancer assessment showed no metastasis. Core biopsies obtained yesterday with cultures pending. Initial gram stain showing no organisms. It does not appear likely to be TB as he has had 3 negative AFB smears. He continues to remain afebrile.  He has been receiving azithromycin which does not appear to have any benefit.   1. Discontinue azithromycin. 2. Continue to monitor cultures and pathology reports.   Principal Problem:   Pulmonary nodule, right Active Problems:   Prostate cancer ([place])   CAP (community acquired pneumonia)   Normocytic normochromic anemia   Weight loss   Fungal pneumonia   Hypoxemia   . pneumococcal 23 valent vaccine  0.5 mL Intramuscular Tomorrow-1000    SUBJECTIVE:  Afebrile overnight with tachycardia. Radiology performed core biopsies of the right upper lobe lesion and were sent for culture and pathology. Gram stain with no organisms seen. AFB smear/culture and fungal culture pending. Continues to have cough.  No Known Allergies   Review of Systems: Review of Systems  Constitutional: Negative for chills, fever and malaise/fatigue.  Respiratory: Positive for cough. Negative for hemoptysis, sputum production, shortness of breath and wheezing.   Cardiovascular: Negative for chest pain and leg swelling.  Gastrointestinal: Negative for abdominal pain, constipation, diarrhea, nausea and vomiting.      OBJECTIVE: Vitals:   12/19/17 2146 12/19/17 2247 12/20/17 0028 12/20/17 0444  BP: (!) 125/93 137/88 132/87 (!) 141/84  Pulse: 91 88 92 90  Resp: (!) 29 (!) 24 (!) 25 (!) 21  Temp:   98.2 F (36.8 C) 98.6 F (37 C)    TempSrc:   Oral Oral  SpO2: 97% 97% 100% 98%  Weight:    103 lb (46.7 kg)  Height:       Body mass index is 17.68 kg/m.  Physical Exam  Constitutional: He appears well-nourished. He does not appear ill.  HENT:  Mouth/Throat: Oropharynx is clear and moist.  Cardiovascular: Normal rate, regular rhythm, normal heart sounds and intact distal pulses. Exam reveals no gallop and no friction rub.  No murmur heard. Pulmonary/Chest: Effort normal and breath sounds normal. No stridor. No respiratory distress. He has no wheezes. He has no rales. He exhibits no tenderness.  Biopsy sight with no evidence of infection.   Abdominal: Soft. Bowel sounds are normal.    Lab Results Lab Results  Component Value Date   WBC 8.2 12/17/2017   HGB 10.4 (L) 12/17/2017   HCT 31.5 (L) 12/17/2017   MCV 80.8 12/17/2017   PLT 465 (H) 12/17/2017    Lab Results  Component Value Date   CREATININE 0.87 12/17/2017   BUN 10 12/17/2017   NA 134 (L) 12/17/2017   K 4.1 12/17/2017   CL 99 (L) 12/17/2017   CO2 25 12/17/2017   No results found for: ALT, AST, GGT, ALKPHOS, BILITOT   Microbiology: Recent Results (from the past 240 hour(s))  Blood culture (routine x 2)     Status: None (Preliminary result)   Collection Time: 12/17/17  1:00 AM  Result Value Ref Range Status   Specimen Description BLOOD LEFT ANTECUBITAL  Final   Special Requests   Final    BOTTLES DRAWN  AEROBIC AND ANAEROBIC Blood Culture adequate volume   Culture   Final    NO GROWTH 2 DAYS Performed at [facility], [place] [address] [place], [place] [address]    Report Status PENDING  Incomplete  Blood culture (routine x 2)     Status: None (Preliminary result)   Collection Time: 12/17/17  1:10 AM  Result Value Ref Range Status   Specimen Description BLOOD RIGHT WRIST  Final   Special Requests   Final    BOTTLES DRAWN AEROBIC AND ANAEROBIC Blood Culture results may not be optimal due to an inadequate volume of blood received in  culture bottles   Culture   Final    NO GROWTH 2 DAYS Performed at [facility], [place] [address] [place], [place] [address]    Report Status PENDING  Incomplete  Aspergillus Ag, BAL/Serum     Status: None   Collection Time: 12/17/17  3:50 AM  Result Value Ref Range Status   Aspergillus Ag, BAL/Serum 0.05 0.00 - 0.49 Index Final    Comment: (NOTE) Performed At: [facility] [place], [place] [id number] Rush Farmer MD [id number] Performed At: [place], [place] [id number] Nechama Guard MD [id number] Performed at [facility], [place] [address], [place], [place] [address]   Culture, sputum-assessment     Status: None   Collection Time: 12/17/17  8:10 AM  Result Value Ref Range Status   Specimen Description SPUTUM  Final   Special Requests NONE  Final   Sputum evaluation   Final    THIS SPECIMEN IS ACCEPTABLE FOR SPUTUM CULTURE Performed at [facility], [address], [place], [place] [address]    Report Status 12/17/2017 FINAL  Final  Culture, respiratory (NON-Expectorated)     Status: None   Collection Time: 12/17/17  8:10 AM  Result Value Ref Range Status   Specimen Description SPUTUM  Final   Special Requests NONE Reflexed from [id number]  Final   Gram Stain   Final    FEW WBC PRESENT, PREDOMINANTLY PMN FEW GRAM POSITIVE COCCI IN PAIRS RARE GRAM NEGATIVE RODS RARE YEAST RARE GRAM NEGATIVE COCCOBACILLI    Culture   Final    Consistent with normal respiratory flora. Performed at [facility], [place] [address], [place], [place] [address]    Report Status 12/19/2017 FINAL  Final  Acid Fast Smear (AFB)     Status: None   Collection Time: 12/17/17  8:12 AM  Result Value Ref Range Status   AFB Specimen Processing Concentration  Final   Acid Fast Smear Negative  Final    Comment: (NOTE) Performed At: [facility] [place], [place] [id number] Rush Farmer MD  [id number]    Source (AFB) SPUTUM  Final    Comment: Performed at [facility], [place] [address], [place], [place] [address]  Acid Fast Smear (AFB)     Status: None   Collection Time: 12/17/17  3:13 PM  Result Value Ref Range Status   AFB Specimen Processing Concentration  Final   Acid Fast Smear Negative  Final    Comment: (NOTE) Performed At: [facility] [place], [place] [id number] Rush Farmer MD [id number]    Source (AFB) SPUTUM  Final  Aerobic/Anaerobic Culture (surgical/deep wound)     Status: None (Preliminary result)   Collection Time: 12/19/17  5:29 PM  Result Value Ref Range Status  Specimen Description TISSUE RIGHT UPPER LUNG  Final   Special Requests NONE  Final   Gram Stain   Final    FEW WBC PRESENT,BOTH PMN AND MONONUCLEAR NO ORGANISMS SEEN Performed at [facility], [address], [place], [place] [address]    Culture PENDING  Incomplete   Report Status PENDING  Incomplete     Terri Piedra, NP [facility] [phone] Pager  12/20/2017  9:56 AM

## 2017-12-20 NOTE — Progress Notes (Signed)
MD notified patient had 10 beats of Vtach. Asymptomatic. Will continue to monitor. Also notified MD that patient has HA 8/10 and tylenol not due yet.

## 2017-12-20 NOTE — Progress Notes (Signed)
HR sustained in 140s when pt walked to/from [facility]. Pt denied palpitation, chest discomfort, or SOB. MD made aware.  Idolina Primer, RN

## 2017-12-21 LAB — ACID FAST SMEAR (AFB)

## 2017-12-21 LAB — ACID FAST SMEAR (AFB, MYCOBACTERIA): Acid Fast Smear: NEGATIVE

## 2017-12-21 MED ORDER — GUAIFENESIN-DM 100-10 MG/5ML PO SYRP
10.0000 mL | ORAL_SOLUTION | ORAL | Status: DC | PRN
Start: 2017-12-21 — End: 2017-12-21

## 2017-12-21 MED ORDER — DM-GUAIFENESIN ER 30-600 MG PO TB12: 1.0000 | ORAL_TABLET | Freq: Two times a day (BID) | ORAL | 0 refills | Status: DC

## 2017-12-21 MED ORDER — ALBUTEROL SULFATE HFA 108 (90 BASE) MCG/ACT IN AERS: 2.0000 | INHALATION_SPRAY | Freq: Four times a day (QID) | RESPIRATORY_TRACT | 0 refills | Status: DC | PRN

## 2017-12-21 NOTE — Discharge Summary (Signed)
Physician Discharge Summary  Tionne Dayhoff DVV:[id number] DOB: 08-Nov-1950 DOA: 12/16/2017  PCP: Bernerd Limbo, MD  Admit date: 12/16/2017 Discharge date: 12/21/2017  Admitted From:Home Disposition:  Home  Discharge Condition:Stable CODE STATUS:Full Diet recommendation: Regular  Brief/Interim Summary:  Patient is a 67 year old male with history of coronary artery disease, asthma,prostate cancer who presented to the emergency room about a week ago for URI type symptoms. He was diagnosed with pneumonia, he was given doxycycline to take for 7 days. Prior to discharge from the ED he underwent sputum cultures. EDP at that time discussed with infectious disease over the phone. Patient went home, and continued to feel sick it despite antibiotics and decided to come back to the hospital. He was experiencing progressive shortness of breath as well as an unrelenting cough which is occasionally productive but mostly dry. Sputum cultures from prior ED visit showed pseudohyphae and yeast.  Chest imaging also shows multiple lung nodules of unknown etiology which seem to have progressed.  Patient admitted for further management. Patient was evaluated by infectious disease after admission.  We consulted pulmonology for possible bronchoscopy but the lung nodules were peripheral.  IR was consulted and he underwent CT-guided biopsy of the  lung nodule on the right upper lobe. Pathology and other cultures are pending.3 sets of AFB smear found to be negative.Initital gram stain is negative.  Infectious disease discontinued the antibiotics.  Infectious disease cleared him for discharge with the plan to follow-up his cultures and pathology as an outpatient. Patient will be discharged home today.  Following problems were addressed during his hospitalization:  Pneumonia -Initial concern for Fungal pneumonia versus tuberculosis versus metastatic prostate cancer -Chest imaging shows stable multiple lung nodules which  seem to have progressed.  -ID consulted.Planned bronchoscopy  . Pulmonology consulted.  Since patient has multiple small to large nodules that are quite peripheral pulmonology recommended FNA of the largest nodule. -IR  Consulted.He  underwent  CT-guided biopsy of right upper lobe lesion on 12/19/17. Specimen was sent to microbiology lab for AFB culture, fungal culture, bacterial culture and also pathology. We will follow up the report. -AFB smear 3 times negative. Sent  antigens for histoplasma, Aspergillus, cryptococcus, Legionella- all negative Respiratory culture consistent with normal respiratory flora. Other cultures so far negative -Was on azithromycin which has been D/Ced -Discontinued droplet precaution -Strep pneumo was negative -HIV was negative  History of asthma -Presently not wheezing.  Continue inhalers at home  History of obstructive coronary artery disease -No chest pain, no cardiac symptoms  Normocytic anemia -Stable  Prostate cancer: Diagnosed with prostate cancer apparently 1 year ago .  PET scan done in December did not show any evidence of metastasis. 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  He used to follow up with urology,Dr Tresa Moore.He is currently following with radiation oncology, Dr. Tammi Klippel. As per the last  note, patient was reluctant to move forward with treatment for prostate cancer due to risks for erectile dysfunction. He was last seen by radiation oncology on 10/16/17, Dr. Tammi Klippel. Patient has been advised to follow-up with Dr. Tammi Klippel as soon as possible.    Discharge Diagnoses:  Principal Problem:   Lung nodule Active Problems:   Prostate cancer ([place])   CAP (community acquired pneumonia)   Normocytic normochromic anemia   Weight loss   Fungal pneumonia   Hypoxemia   History of lung biopsy    Discharge Instructions  Discharge Instructions    Diet - low sodium heart healthy  Complete by:  As directed     Discharge instructions   Complete by:  As directed    1) Follow up with your PCP in a week. 2) Follow up with the infectious disease as an outpatient.  Name and number of the provider has been attached. 3)We will follow up with biopsy report and give you a call. 4) Follow up with your urologist as soon as possible.   Increase activity slowly   Complete by:  As directed      Allergies as of 12/21/2017   No Known Allergies     Medication List    STOP taking these medications   doxycycline 100 MG capsule Commonly known as:  VIBRAMYCIN   MUCINEX PO     TAKE these medications   acetaminophen 500 MG tablet Commonly known as:  TYLENOL Take 500 mg by mouth every 6 (six) hours as needed for headache (pain).   albuterol 108 (90 Base) MCG/ACT inhaler Commonly known as:  PROVENTIL HFA;VENTOLIN HFA Inhale 2 puffs into the lungs every 6 (six) hours as needed for wheezing or shortness of breath.   aspirin 81 MG chewable tablet Chew 1 tablet (81 mg total) by mouth daily. What changed:  Another medication with the same name was removed. Continue taking this medication, and follow the directions you see here.   benzonatate 100 MG capsule Commonly known as:  TESSALON Take 1 capsule (100 mg total) by mouth every 8 (eight) hours.   dextromethorphan-guaiFENesin 30-600 MG 12hr tablet Commonly known as:  MUCINEX DM Take 1 tablet by mouth 2 (two) times daily.   multivitamin with minerals Tabs tablet Take 1 tablet by mouth daily.   OVER THE COUNTER MEDICATION Take 1 tablet by mouth 2 (two) times daily. Super Beta Prostate supplement   OVER THE COUNTER MEDICATION Apply 1 application topically daily as needed (arthritis pain).   sildenafil 20 MG tablet Commonly known as:  REVATIO Take 60 mg by mouth daily as needed (erectile dysfunction).   traMADol 50 MG tablet Commonly known as:  ULTRAM Take 1 tablet (50 mg total) by mouth every 6 (six) hours as needed.      Follow-up  Information    Bernerd Limbo, MD. Schedule an appointment as soon as possible for a visit in 1 week(s).   Specialty:  Family Medicine Contact information: [place] [address] [phone]        Tommy Medal, Lavell Islam, MD. Schedule an appointment as soon as possible for a visit in 1 week(s).   Specialty:  Infectious Diseases Contact information: [address] [place] [address] [phone]        Tyler Pita, MD. Schedule an appointment as soon as possible for a visit in 1 week(s).   Specialty:  Radiation Oncology Contact information: [place] [address] [phone]          No Known Allergies  Consultations:  Infectious disease, pulmonology   Procedures/Studies: Dg Chest 2 View  Result Date: 12/16/2017 CLINICAL DATA:  Productive cough for 1 week.  Chest pain. EXAM: CHEST - 2 VIEW COMPARISON:  Chest CT and chest radiograph on 12/08/2017. FINDINGS: Heart size is normal. Mild mediastinal lymphadenopathy in the right superior paratracheal region shows no significant change. Multiple small irregular nodular densities are again seen bilaterally, with upper lobe predominance. One of these nodules in the lateral right lung base shows cystic lucency which may represent cavitation. No evidence of pleural effusion. IMPRESSION: No  significant change in multiple ill-defined pulmonary nodular opacities with upper lobe predominance and right paratracheal lymphadenopathy. These findings are suspicious for infectious etiology, possibly fungal pneumonia, tuberculosis, or septic emboli. Electronically Signed   By: Earle Gell M.D.   On: 12/16/2017 20:26   Dg Chest 2 View  Result Date: 12/08/2017 CLINICAL DATA:  Cough and congestion for 2 weeks with earache and right rib pain. EXAM: CHEST - 2 VIEW COMPARISON:  11/14/2017 FINDINGS: Reticulonodular densities in the right more than left upper lobes that are newly seen. A apical  subpleural opacity closely associated with the right lateral fourth rib was likely present in retrospect, indeterminate. There is a background of airway thickening. No edema, effusion, or pneumothorax. Normal heart size. IMPRESSION: Right more than left reticular and nodular opacities with rapid development/progression favoring infectious or inflammatory process. After convalescence, if these nodular areas persist on radiography a chest CT is recommended to evaluate for metastatic disease in this patient with history of high-grade prostate carcinoma. Electronically Signed   By: Monte Fantasia M.D.   On: 12/08/2017 10:35   Ct Head Wo Contrast  Result Date: 11/29/2017 CLINICAL DATA:  Posterior neck pain and headaches for months increased in severity over past 5 days, history prostate cancer, COPD, coronary artery disease, hypertension EXAM: CT HEAD WITHOUT CONTRAST CT CERVICAL SPINE WITHOUT CONTRAST TECHNIQUE: Multidetector CT imaging of the head and cervical spine was performed following the standard protocol without intravenous contrast. Multiplanar CT image reconstructions of the cervical spine were also generated. COMPARISON:  12/15/2012 CT head; correlation chest radiograph 11/14/2017 FINDINGS: CT HEAD FINDINGS Brain: Asymmetry of lateral ventricles LEFT larger than RIGHT unchanged. No midline shift or mass effect. Small vessel chronic ischemic changes of deep cerebral white matter. No intracranial hemorrhage, mass lesion, or evidence of acute infarction. No extra-axial fluid collections. Vascular: Atherosclerotic calcification of internal carotid and vertebral arteries at skull base Skull: Intact Sinuses/Orbits: Clear Other: N/A CT CERVICAL SPINE FINDINGS Alignment: Minimal anterolisthesis at C4-C5. Remaining alignment normal. Skull base and vertebrae: Skull base intact. Degenerative disc disease changes at C5-C6, C6-C7 and C7-T1 with disc space narrowing and endplate spur formation. Vertebral body heights  maintained. No fracture, subluxation or bone destruction. Scattered facet degenerative changes. Soft tissues and spinal canal: Prevertebral soft tissues normal thickness. Disc levels: Minimal endplate spur formation and bulging disc. Spinal canal patent. Upper chest: Pleural thickening and question pleural nodularity at RIGHT apex, not identified on most recent chest radiograph. LEFT apex clear. 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 Small vessel chronic ischemic changes of deep cerebral white matter. No acute intracranial abnormalities. Degenerative disc and facet disease changes of the cervical spine with minimal anterolisthesis at C4-C5. No acute cervical spine abnormalities. Pleural thickening and nodularity at RIGHT apex of uncertain etiology; CT chest with contrast recommended for further evaluation. Electronically Signed   By: Lavonia Dana M.D.   On: 11/29/2017 18:01   Ct Angio Chest Pe W And/or Wo Contrast  Result Date: 12/08/2017 CLINICAL DATA:  Cough.  Congestion.  Prostate cancer.  COPD. EXAM: CT ANGIOGRAPHY CHEST WITH CONTRAST TECHNIQUE: Multidetector CT imaging of the chest was performed using the standard protocol during bolus administration of intravenous contrast. Multiplanar CT image reconstructions and MIPs were obtained to evaluate the vascular anatomy. CONTRAST:  27mL ISOVUE-370 IOPAMIDOL (ISOVUE-370) INJECTION 76% COMPARISON:  Chest radiograph from earlier today. FINDINGS: Cardiovascular: The study is high quality for the evaluation of pulmonary embolism. There are no filling defects in the  central, lobar, segmental or subsegmental pulmonary artery branches to suggest acute pulmonary embolism. Great vessels are normal in course and caliber. Normal heart size. No significant pericardial fluid/thickening. Mediastinum/Nodes: No discrete thyroid nodules. Patulous thoracic esophagus. Mildly enlarged 1.1 cm subcarinal node (series 6/image 74). Mild  bilateral hilar adenopathy measuring up to 1.0 cm (series 6/image 67 on the right and image 74 on the left). No axillary adenopathy. Lungs/Pleura: No pneumothorax. No pleural effusion. Mild centrilobular emphysema with diffuse bronchial wall thickening. There are numerous poorly marginated nodular foci of consolidation scattered throughout both lungs, predominantly involving the peripheral lungs, asymmetrically involving the right lung. Representative peripheral right upper lobe 1.9 cm nodular focus of consolidation (series 7/image 44) and representative inferior right middle lobe 1.8 cm nodular focus of consolidation (series 7/image 114), which is new since 08/26/2017 CT abdomen study. Upper abdomen: Simple 1.7 cm right liver lobe cyst. Musculoskeletal:  No aggressive appearing focal osseous lesions. Review of the MIP images confirms the above findings. IMPRESSION: 1. No pulmonary embolism. 2. Numerous poorly marginated nodular foci of consolidation throughout the lungs, predominantly in the peripheral lungs, asymmetrically involving the right lung, which appear new/increasing since 08/26/2017 CT abdomen study and 11/14/2017 chest radiograph. This rapid onset favors an infectious etiology. Consider atypical pneumonia such as fungal pneumonia. 3. Mild mediastinal and bilateral hilar lymphadenopathy, nonspecific. 4. Recommend follow-up post treatment chest CT with IV contrast in 3 months to reassess these findings. Emphysema (ICD10-J43.9). Electronically Signed   By: Ilona Sorrel M.D.   On: 12/08/2017 16:32   Ct Cervical Spine Wo Contrast  Result Date: 11/29/2017 CLINICAL DATA:  Posterior neck pain and headaches for months increased in severity over past 5 days, history prostate cancer, COPD, coronary artery disease, hypertension EXAM: CT HEAD WITHOUT CONTRAST CT CERVICAL SPINE WITHOUT CONTRAST TECHNIQUE: Multidetector CT imaging of the head and cervical spine was performed following the standard protocol without  intravenous contrast. Multiplanar CT image reconstructions of the cervical spine were also generated. COMPARISON:  12/15/2012 CT head; correlation chest radiograph 11/14/2017 FINDINGS: CT HEAD FINDINGS Brain: Asymmetry of lateral ventricles LEFT larger than RIGHT unchanged. No midline shift or mass effect. Small vessel chronic ischemic changes of deep cerebral white matter. No intracranial hemorrhage, mass lesion, or evidence of acute infarction. No extra-axial fluid collections. Vascular: Atherosclerotic calcification of internal carotid and vertebral arteries at skull base Skull: Intact Sinuses/Orbits: Clear Other: N/A CT CERVICAL SPINE FINDINGS Alignment: Minimal anterolisthesis at C4-C5. Remaining alignment normal. Skull base and vertebrae: Skull base intact. Degenerative disc disease changes at C5-C6, C6-C7 and C7-T1 with disc space narrowing and endplate spur formation. Vertebral body heights maintained. No fracture, subluxation or bone destruction. Scattered facet degenerative changes. Soft tissues and spinal canal: Prevertebral soft tissues normal thickness. Disc levels: Minimal endplate spur formation and bulging disc. Spinal canal patent. Upper chest: Pleural thickening and question pleural nodularity at RIGHT apex, not identified on most recent chest radiograph. LEFT apex clear. 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 Small vessel chronic ischemic changes of deep cerebral white matter. No acute intracranial abnormalities. Degenerative disc and facet disease changes of the cervical spine with minimal anterolisthesis at C4-C5. No acute cervical spine abnormalities. Pleural thickening and nodularity at RIGHT apex of uncertain etiology; CT chest with contrast recommended for further evaluation. Electronically Signed   By: Lavonia Dana M.D.   On: 11/29/2017 18:01   Ct Biopsy  Result Date: 12/19/2017 INDICATION: 67 year old with multiple pulmonary nodules. Request  for a lung  biopsy for culture and histology. EXAM: CT-GUIDED CORE BIOPSIES OF RIGHT PULMONARY NODULE MEDICATIONS: None. ANESTHESIA/SEDATION: Moderate (conscious) sedation was employed during this procedure. A total of Versed 2.0 mg and Fentanyl 100 mcg was administered intravenously. Moderate Sedation Time: 19 minutes. The patient's level of consciousness and vital signs were monitored continuously by radiology nursing throughout the procedure under my direct supervision. FLUOROSCOPY TIME:  None COMPLICATIONS: None immediate. PROCEDURE: Informed written consent was obtained from the patient after a thorough discussion of the procedural risks, benefits and alternatives. All questions were addressed. A timeout was performed prior to the initiation of the procedure. Patient was placed supine on the CT scanner. Images through the chest were obtained. A peripheral nodule in the right upper lobe was targeted. The right axillary region was prepped and draped in a sterile fashion. Skin and soft tissues were anesthetized with 1% lidocaine. 17 gauge coaxial needle was directed into the peripheral lesion with CT guidance. Two core biopsies were obtained with an 18 gauge core device. One specimen placed in saline for culture and the other was placed in formalin. 17 gauge needle was removed without complication. Bandage placed over the puncture site. FINDINGS: Innumerable pulmonary nodules, predominantly along the pleural surfaces. Lesion in the lateral right upper lung was biopsied. Small amount of parenchymal hemorrhage following the core biopsies. Negative for pneumothorax. IMPRESSION: CT-guided core biopsies of right upper lobe pulmonary nodule. Electronically Signed   By: Markus Daft M.D.   On: 12/19/2017 18:36   Dg Chest Port 1 View  Result Date: 12/19/2017 CLINICAL DATA:  Status post lung biopsy. EXAM: PORTABLE CHEST 1 VIEW COMPARISON:  CT scan, same day. FINDINGS: No postprocedural pneumothorax is identified. Stable  upper lung predominant nodularity. The cardiac silhouette, mediastinal and hilar contours are stable. No pleural effusion. IMPRESSION: No postprocedural pneumothorax. Electronically Signed   By: Marijo Sanes M.D.   On: 12/19/2017 18:47    (Echo, Carotid, EGD, Colonoscopy, ERCP)    Subjective: Patient seen and examined the bedside this morning.  Continues to cough.  He is hemodynamically stable.  Very eager to go home.  Discharge Exam: Vitals:   12/20/17 2050 12/21/17 0448  BP: 127/90 (!) 154/102  Pulse:    Resp:    Temp: 98.9 F (37.2 C) 99.7 F (37.6 C)  SpO2:     Vitals:   12/20/17 0444 12/20/17 1347 12/20/17 2050 12/21/17 0448  BP: (!) 141/84 (!) 138/94 127/90 (!) 154/102  Pulse: 90     Resp: (!) 21     Temp: 98.6 F (37 C) 98.1 F (36.7 C) 98.9 F (37.2 C) 99.7 F (37.6 C)  TempSrc: Oral Oral Oral Oral  SpO2: 98%     Weight: 46.7 kg (103 lb)     Height:        General: Pt is alert, awake, not in acute distress, thin and malnourished Cardiovascular: RRR, S1/S2 +, no rubs, no gallops Respiratory: Decreased air entry, no wheezing, no rhonchi Abdominal: Soft, NT, ND, bowel sounds + Extremities: no edema, no cyanosis    The results of significant diagnostics from this hospitalization (including imaging, microbiology, ancillary and laboratory) are listed below for reference.     Microbiology: Recent Results (from the past 240 hour(s))  Blood culture (routine x 2)     Status: None (Preliminary result)   Collection Time: 12/17/17  1:00 AM  Result Value Ref Range Status   Specimen Description BLOOD LEFT ANTECUBITAL  Final   Special Requests   Final  BOTTLES DRAWN AEROBIC AND ANAEROBIC Blood Culture adequate volume   Culture   Final    NO GROWTH 3 DAYS Performed at [facility], [place] [address], [place], [place] [address]    Report Status PENDING  Incomplete  Blood culture (routine x 2)     Status: None (Preliminary result)   Collection Time: 12/17/17   1:10 AM  Result Value Ref Range Status   Specimen Description BLOOD RIGHT WRIST  Final   Special Requests   Final    BOTTLES DRAWN AEROBIC AND ANAEROBIC Blood Culture results may not be optimal due to an inadequate volume of blood received in culture bottles   Culture   Final    NO GROWTH 3 DAYS Performed at [facility], [place] [address] [place], [place] [address]    Report Status PENDING  Incomplete  Aspergillus Ag, BAL/Serum     Status: None   Collection Time: 12/17/17  3:50 AM  Result Value Ref Range Status   Aspergillus Ag, BAL/Serum 0.05 0.00 - 0.49 Index Final    Comment: (NOTE) Performed At: [facility] [place], [place] [id number] Rush Farmer MD IR:[id number] Performed At: [place], [place] [id number] Nechama Guard MD IZ:[id number] Performed at [facility], [place] [address] [place], [place] [address]   Culture, sputum-assessment     Status: None   Collection Time: 12/17/17  8:10 AM  Result Value Ref Range Status   Specimen Description SPUTUM  Final   Special Requests NONE  Final   Sputum evaluation   Final    THIS SPECIMEN IS ACCEPTABLE FOR SPUTUM CULTURE Performed at [facility], [address], [place], [place] [address]    Report Status 12/17/2017 FINAL  Final  Culture, respiratory (NON-Expectorated)     Status: None   Collection Time: 12/17/17  8:10 AM  Result Value Ref Range Status   Specimen Description SPUTUM  Final   Special Requests NONE Reflexed from [phone]  Final   Gram Stain   Final    FEW WBC PRESENT, PREDOMINANTLY PMN FEW GRAM POSITIVE COCCI IN PAIRS RARE GRAM NEGATIVE RODS RARE YEAST RARE GRAM NEGATIVE COCCOBACILLI    Culture   Final    Consistent with normal respiratory flora. Performed at [facility], [place] [address] [place],  [address]    Report Status 12/19/2017 FINAL  Final  Acid Fast Smear (AFB)     Status: None   Collection Time: 12/17/17   8:12 AM  Result Value Ref Range Status   AFB Specimen Processing Concentration  Final   Acid Fast Smear Negative  Final    Comment: (NOTE) Performed At: [facility] [place], [place] [id number] Rush Farmer MD [id number]    Source (AFB) SPUTUM  Final    Comment: Performed at [facility], [place] [address], [place], [place] [address]  Acid Fast Smear (AFB)     Status: None   Collection Time: 12/17/17  3:13 PM  Result Value Ref Range Status   AFB Specimen Processing Concentration  Final   Acid Fast Smear Negative  Final    Comment: (NOTE) Performed At: [facility] [place], [place] [id number] Rush Farmer MD YC:[id number]    Source (AFB) SPUTUM  Final  Acid Fast Smear (AFB)     Status: None   Collection Time: 12/19/17  5:29 PM  Result Value Ref Range Status  AFB Specimen Processing Concentration  Final   Acid Fast Smear Negative  Final    Comment: (NOTE) Performed At: [facility] [place], [place] [id number] Rush Farmer MD XQ:[id number]    Source (AFB) TISSUE  Final    Comment: RIGHT UPPER LUNG   Culture, fungus without smear     Status: None (Preliminary result)   Collection Time: 12/19/17  5:29 PM  Result Value Ref Range Status   Specimen Description TISSUE RIGHT UPPER LUNG  Final   Special Requests NONE  Final   Culture   Final    NO GROWTH < 24 HOURS Performed at [facility], [place] [address] [place], [place] [address]    Report Status PENDING  Incomplete  Aerobic/Anaerobic Culture (surgical/deep wound)     Status: None (Preliminary result)   Collection Time: 12/19/17  5:29 PM  Result Value Ref Range Status   Specimen Description TISSUE RIGHT UPPER LUNG  Final   Special Requests NONE  Final   Gram Stain   Final    FEW WBC PRESENT,BOTH PMN AND MONONUCLEAR NO ORGANISMS SEEN    Culture   Final    NO GROWTH 2 DAYS Performed at [facility], [place] [address]  [place], [place] [address]    Report Status PENDING  Incomplete     Labs: BNP (last 3 results) No results for input(s): BNP in the last 8760 hours. Basic Metabolic Panel: Recent Labs  Lab 12/16/17 1934 12/17/17 0345  NA 132* 134*  K 3.6 4.1  CL 98* 99*  CO2 24 25  GLUCOSE 93 105*  BUN 10 10  CREATININE 0.95 0.87  CALCIUM 8.8* 8.9   Liver Function Tests: No results for input(s): AST, ALT, ALKPHOS, BILITOT, PROT, ALBUMIN in the last 168 hours. No results for input(s): LIPASE, AMYLASE in the last 168 hours. No results for input(s): AMMONIA in the last 168 hours. CBC: Recent Labs  Lab 12/16/17 1934 12/17/17 0345  WBC 8.3 8.2  HGB 10.6* 10.4*  HCT 31.6* 31.5*  MCV 80.8 80.8  PLT 459* 465*   Cardiac Enzymes: No results for input(s): CKTOTAL, CKMB, CKMBINDEX, TROPONINI in the last 168 hours. BNP: Invalid input(s): POCBNP CBG: No results for input(s): GLUCAP in the last 168 hours. D-Dimer No results for input(s): DDIMER in the last 72 hours. Hgb A1c No results for input(s): HGBA1C in the last 72 hours. Lipid Profile No results for input(s): CHOL, HDL, LDLCALC, TRIG, CHOLHDL, LDLDIRECT in the last 72 hours. Thyroid function studies No results for input(s): TSH, T4TOTAL, T3FREE, THYROIDAB in the last 72 hours.  Invalid input(s): FREET3 Anemia work up No results for input(s): VITAMINB12, FOLATE, FERRITIN, TIBC, IRON, RETICCTPCT in the last 72 hours. Urinalysis No results found for: COLORURINE, APPEARANCEUR, [place], [place], [place], [place], [place], [place], PROTEINUR, UROBILINOGEN, NITRITE, LEUKOCYTESUR Sepsis Labs Invalid input(s): PROCALCITONIN,  WBC,  LACTICIDVEN Microbiology Recent Results (from the past 240 hour(s))  Blood culture (routine x 2)     Status: None (Preliminary result)   Collection Time: 12/17/17  1:00 AM  Result Value Ref Range Status   Specimen Description BLOOD LEFT ANTECUBITAL  Final   Special Requests   Final    BOTTLES DRAWN AEROBIC AND  ANAEROBIC Blood Culture adequate volume   Culture   Final    NO GROWTH 3 DAYS Performed at [facility], [place] [address] [place], [place] [address]    Report Status PENDING  Incomplete  Blood culture (routine x  2)     Status: None (Preliminary result)   Collection Time: 12/17/17  1:10 AM  Result Value Ref Range Status   Specimen Description BLOOD RIGHT WRIST  Final   Special Requests   Final    BOTTLES DRAWN AEROBIC AND ANAEROBIC Blood Culture results may not be optimal due to an inadequate volume of blood received in culture bottles   Culture   Final    NO GROWTH 3 DAYS Performed at [facility], [place] [address], [place], [place] [address]    Report Status PENDING  Incomplete  Aspergillus Ag, BAL/Serum     Status: None   Collection Time: 12/17/17  3:50 AM  Result Value Ref Range Status   Aspergillus Ag, BAL/Serum 0.05 0.00 - 0.49 Index Final    Comment: (NOTE) Performed At: [facility] [place], [place] [id number] Rush Farmer MD [id number] Performed At: [place], [place] [id number] Nechama Guard MD [id number]:[id number] Performed at [facility], [place] [address], [place], [place] [address]   Culture, sputum-assessment     Status: None   Collection Time: 12/17/17  8:10 AM  Result Value Ref Range Status   Specimen Description SPUTUM  Final   Special Requests NONE  Final   Sputum evaluation   Final    THIS SPECIMEN IS ACCEPTABLE FOR SPUTUM CULTURE Performed at [facility], [address] [place], [place] [address]    Report Status 12/17/2017 FINAL  Final  Culture, respiratory (NON-Expectorated)     Status: None   Collection Time: 12/17/17  8:10 AM  Result Value Ref Range Status   Specimen Description SPUTUM  Final   Special Requests NONE Reflexed from [phone]  Final   Gram Stain   Final    FEW WBC PRESENT, PREDOMINANTLY PMN FEW GRAM POSITIVE COCCI IN PAIRS RARE GRAM NEGATIVE RODS RARE  YEAST RARE GRAM NEGATIVE COCCOBACILLI    Culture   Final    Consistent with normal respiratory flora. Performed at [facility], [place] [address] [place] [address], [place], [place] [address]    Report Status 12/19/2017 FINAL  Final  Acid Fast Smear (AFB)     Status: None   Collection Time: 12/17/17  8:12 AM  Result Value Ref Range Status   AFB Specimen Processing Concentration  Final   Acid Fast Smear Negative  Final    Comment: (NOTE) Performed At: [facility] [place], [place] [id number] Rush Farmer MD KW:[id number]    Source (AFB) SPUTUM  Final    Comment: Performed at [facility], [place] [address] [place], [place] [address]  Acid Fast Smear (AFB)     Status: None   Collection Time: 12/17/17  3:13 PM  Result Value Ref Range Status   AFB Specimen Processing Concentration  Final   Acid Fast Smear Negative  Final    Comment: (NOTE) Performed At: [facility] [place], [place] [id number] Rush Farmer MD [id number]    Source (AFB) SPUTUM  Final  Acid Fast Smear (AFB)     Status: None   Collection Time: 12/19/17  5:29 PM  Result Value Ref Range Status   AFB Specimen Processing Concentration  Final   Acid Fast Smear Negative  Final    Comment: (NOTE) Performed At: [facility] [address], [place] [id number] Rush Farmer MD [id number]:[id number]    Source (AFB) TISSUE  Final    Comment: RIGHT UPPER  LUNG   Culture, fungus without smear     Status: None (Preliminary result)   Collection Time: 12/19/17  5:29 PM  Result Value Ref Range Status   Specimen Description TISSUE RIGHT UPPER LUNG  Final   Special Requests NONE  Final   Culture   Final    NO GROWTH < 24 HOURS Performed at [facility], [place] [address], [place], [place] [address]    Report Status PENDING  Incomplete  Aerobic/Anaerobic Culture (surgical/deep wound)     Status: None (Preliminary result)   Collection Time: 12/19/17  5:29 PM  Result  Value Ref Range Status   Specimen Description TISSUE RIGHT UPPER LUNG  Final   Special Requests NONE  Final   Gram Stain   Final    FEW WBC PRESENT,BOTH PMN AND MONONUCLEAR NO ORGANISMS SEEN    Culture   Final    NO GROWTH 2 DAYS Performed at [facility], [place] [address] [place], [place] [address]    Report Status PENDING  Incomplete     Time coordinating discharge: 35 minutes  SIGNED:   Shelly Coss, MD  [facility] 12/21/2017, 11:41 AM Pager [phone]  If 7PM-7AM, please contact night-coverage [email or web address] Password [email or web address]

## 2017-12-21 NOTE — Plan of Care (Signed)
  Problem: Education: Goal: Knowledge of General Education information will improve Outcome: Progressing Note:  POC reviewed with pt.   

## 2017-12-22 LAB — CULTURE, BLOOD (ROUTINE X 2)
Culture: NO GROWTH
Culture: NO GROWTH
SPECIAL REQUESTS: ADEQUATE

## 2017-12-24 ENCOUNTER — Encounter: Payer: Self-pay | Admitting: Internal Medicine

## 2017-12-24 ENCOUNTER — Ambulatory Visit (INDEPENDENT_AMBULATORY_CARE_PROVIDER_SITE_OTHER): Payer: Medicare Other | Admitting: Internal Medicine

## 2017-12-24 DIAGNOSIS — Z9889 Other specified postprocedural states: Secondary | ICD-10-CM

## 2017-12-24 LAB — AEROBIC/ANAEROBIC CULTURE (SURGICAL/DEEP WOUND)

## 2017-12-24 LAB — AEROBIC/ANAEROBIC CULTURE W GRAM STAIN (SURGICAL/DEEP WOUND): Culture: NO GROWTH

## 2017-12-24 NOTE — Progress Notes (Signed)
[facility] for Infectious Disease  Patient Active Problem List   Diagnosis Date Noted  . History of lung biopsy     Priority: High  . Lung nodule 12/17/2017    Priority: High  . Hypoxemia   . CAP (community acquired pneumonia) 12/17/2017  . Normocytic normochromic anemia 12/17/2017  . Weight loss   . Prostate cancer ([place]) 10/15/2017  . Medicare annual wellness visit, subsequent 08/03/2016  . Screening for AAA (abdominal aortic aneurysm) 08/03/2016  . Need for hepatitis C screening test 08/03/2016  . Elevated prostate specific antigen (PSA) 11/25/2015  . 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 Screening for colon cancer 10/28/2015  . Screening for cardiovascular condition 10/28/2015  . HYPERTENSION 12/24/2007  . Essential (primary) hypertension 12/24/2007  . ASTHMA 12/23/2007  . C O P D 12/23/2007  . G E R D 12/23/2007  . Chronic obstructive pulmonary disease ([place]) 12/23/2007  . Acid reflux 12/23/2007  . Asthma 12/23/2007    Patient's Medications  New Prescriptions   No medications on file  Previous Medications   ACETAMINOPHEN (TYLENOL) 500 MG TABLET    Take 500 mg by mouth every 6 (six) hours as needed for headache (pain).    ALBUTEROL (PROVENTIL HFA;VENTOLIN HFA) 108 (90 BASE) MCG/ACT INHALER    Inhale 2 puffs into the lungs every 6 (six) hours as needed for wheezing or shortness of breath.   ASPIRIN 81 MG CHEWABLE TABLET    Chew 1 tablet (81 mg total) by mouth daily.   BENZONATATE (TESSALON) 100 MG CAPSULE    Take 1 capsule (100 mg total) by mouth every 8 (eight) hours.   DEXTROMETHORPHAN-GUAIFENESIN (MUCINEX DM) 30-600 MG 12HR TABLET    Take 1 tablet by mouth 2 (two) times daily.   MULTIPLE VITAMIN (MULTIVITAMIN WITH MINERALS) TABS TABLET    Take 1 tablet by mouth daily.   OVER THE COUNTER MEDICATION    Take 1 tablet by mouth 2 (two) times daily. Super Beta Prostate supplement   OVER THE COUNTER MEDICATION    Apply 1  application topically daily as needed (arthritis pain).   SILDENAFIL (REVATIO) 20 MG TABLET    Take 60 mg by mouth daily as needed (erectile dysfunction).    TRAMADOL (ULTRAM) 50 MG TABLET    Take 1 tablet (50 mg total) by mouth every 6 (six) hours as needed.  Modified Medications   No medications on file  Discontinued Medications   No medications on file    Subjective: [name] is a 66 year old with a history of COPD and prostate cancer who was recently hospitalized for recent productive cough, shortness of breath, wheezing and chest pain.  A CT showed some new peripheral lung nodules.  Sputum was obtained on 12/08/2017 and showed pseudohyphae and yeast.  My partners, [name] and [name], were consulted.  Routine sputum cultures grew normal respiratory flora.  Aspergillus antigen was negative.  AFB smear was negative.  He was taken off of empiric antibiotics.  He underwent needle biopsy of a right upper lobe peripheral lung nodule on 12/19/2017.  No organisms were seen on Gram, AFB or fungal stains.  Cultures are negative so far and path results are pending.  He was discharged home on 12/21/2017.  After discharge original sputum culture was reported to have grown Candida albicans.  He continues to be bothered by cough productive of clear sputum, shortness of breath, wheezing and pleuritic right chest pain.  He has a history of intermittent acid reflux.  He takes Prilosec as needed.  He has not been bothered by acid reflux recently and has not been taking the Prilosec for the past 6 months or so.  Review of Systems: Review of Systems  Constitutional: Positive for malaise/fatigue and weight loss. Negative for chills, diaphoresis and fever.       His appetite is poor and he has had about a 5 pound weight loss.  Respiratory: Positive for cough, sputum production, shortness of breath and wheezing. Negative for hemoptysis.   Cardiovascular: Positive for chest pain.    Past Medical History:    Diagnosis Date  . BPH with urinary obstruction   . CAD (coronary artery disease)    per cardiac cath 01-08-2003  non-obstructive mild cad w/ normal LVSF  . COPD (chronic obstructive pulmonary disease) ([place])    per pulmologist note in 2009  GOLD 1  . DDD (degenerative disc disease), cervical    C5-7, C7-T1  . Diverticulosis of colon   . Elevated PSA   . GERD (gastroesophageal reflux disease)   . History of adenomatous polyp of colon   . History of hypertension   . History of precordial chest pain    as of 07-22-2017  denies cardiac S&S  . Prostate cancer ([place])   . Wears glasses     Social History   Tobacco Use  . Smoking status: Former Smoker    Packs/day: 1.50    Years: 20.00    Pack years: 30.00    Types: Cigarettes    Last attempt to quit: 07/23/1987    Years since quitting: 30.4  . Smokeless tobacco: Former Systems developer    Types: Snuff    Quit date: 07/23/1987  Substance Use Topics  . Alcohol use: No    Frequency: Never  . Drug use: No    Family History  Problem Relation Age of Onset  . Heart disease Brother   . Colon cancer Brother   . Heart disease Sister   . Cancer Sister        unknown  . Cancer Mother        unknown    No Known Allergies  Objective: Vitals:   12/24/17 1018  BP: 125/81  Pulse: (!) 105  Temp: 98.1 F (36.7 C)  TempSrc: Oral  Weight: 111 lb (50.3 kg)  Height: 5\' 4"  (1.626 m)   Body mass index is 19.05 kg/m.  Physical Exam  Constitutional: He is oriented to person, place, and time.  He is thin and frail.  He is in no distress.  HENT:  Mouth/Throat: No oropharyngeal exudate.  Cardiovascular: Normal rate, regular rhythm and normal heart sounds.  No murmur heard. Pulmonary/Chest: Effort normal. He has wheezes. He has no rales.  He coughs frequently during the exam when trying to answer questions and clutches the right side of his chest.  Abdominal: Soft. He exhibits no distension. There is no tenderness.  Neurological: He is alert  and oriented to person, place, and time.  Skin: No rash noted.  Psychiatric: He has a normal mood and affect.    Lab Results    Problem List Items Addressed This Visit      High   History of lung biopsy    His recent respiratory symptoms and new lung nodules remains uncertain.  The Candida albicans that was isolated from sputum is an insignificant part of normal respiratory flora.  It is certainly possible that he has some smoldering  infection but I would also still consider malignancy, vasculitis (c-ANCA was positive at 1:80) and occult acid reflux in the differential.  I will have him stay off of antibiotics.  I suggested that he restart Prilosec to see if that helps the cough and wheezing while we wait on results of his lung biopsy.  I will arrange follow-up here in 1 week.          Michel Bickers, MD [facility] [phone] pager   [phone] cell 12/24/2017, 10:54 AM

## 2017-12-24 NOTE — Assessment & Plan Note (Signed)
His recent respiratory symptoms and new lung nodules remains uncertain.  The Candida albicans that was isolated from sputum is an insignificant part of normal respiratory flora.  It is certainly possible that he has some smoldering infection but I would also still consider malignancy, vasculitis (c-ANCA was positive at 1:80) and occult acid reflux in the differential.  I will have him stay off of antibiotics.  I suggested that he restart Prilosec to see if that helps the cough and wheezing while we wait on results of his lung biopsy.  I will arrange follow-up here in 1 week.

## 2017-12-25 NOTE — Progress Notes (Signed)
ekg 12-17-47 epic    cxr 12-19-17 epic  Pt/inr 12-18-17 epic   Cbc, bmp 12-17-17 epic   Ct chest 12-08-17 epic

## 2017-12-25 NOTE — Patient Instructions (Signed)
Johnmark Geiger  12/25/2017   Your procedure is scheduled on: 01-03-18   Report to [facility] Main  Entrance    Report to admitting at 9:30AM    Call this number if you have problems the morning of surgery [phone]     Remember: Do not eat food :After Midnight on Wednesday 01-01-18. Drink plenty of clear liquids all day Thursday 01-02-18 and follow all bowel prep instructions provided by your surgeon. Nothing by mouth after midnight on Thursday!      Take these medicines the morning of surgery with A SIP OF WATER: mucinex, albuterol inhaler if needed (please bring)                                 You may not have any metal on your body including hair pins and              piercings  Do not wear jewelry, make-up, lotions, powders or perfumes, deodorant                     Men may shave face and neck.   Do not bring valuables to the hospital. [place].  Contacts, dentures or bridgework may not be worn into surgery.  Leave suitcase in the car. After surgery it may be brought to your room.                 Please read over the following fact sheets you were given: _____________________________________________________________________    CLEAR LIQUID DIET   Foods Allowed                                                                     Foods Excluded  Coffee and tea, regular and decaf                             liquids that you cannot  Plain Jell-O in any flavor                                             see through such as: Fruit ices (not with fruit pulp)                                     milk, soups, orange juice  Iced Popsicles                                    All solid food Carbonated beverages, regular and diet  Cranberry, grape and apple juices Sports drinks like Gatorade Lightly seasoned clear broth or consume(fat free) Sugar, honey syrup  Sample  Menu Breakfast                                Lunch                                     Supper Cranberry juice                    Beef broth                            Chicken broth Jell-O                                     Grape juice                           Apple juice Coffee or tea                        Jell-O                                      Popsicle                                                Coffee or tea                        Coffee or tea  _____________________________________________________________________  [facility] - Preparing for Surgery Before surgery, you can play an important role.  Because skin is not sterile, your skin needs to be as free of germs as possible.  You can reduce the number of germs on your skin by washing with CHG (chlorahexidine gluconate) soap before surgery.  CHG is an antiseptic cleaner which kills germs and bonds with the skin to continue killing germs even after washing. Please DO NOT use if you have an allergy to CHG or antibacterial soaps.  If your skin becomes reddened/irritated stop using the CHG and inform your nurse when you arrive at Short Stay. Do not shave (including legs and underarms) for at least 48 hours prior to the first CHG shower.  You may shave your face/neck. Please follow these instructions carefully:  1.  Shower with CHG Soap the night before surgery and the  morning of Surgery.  2.  If you choose to wash your hair, wash your hair first as usual with your  normal  shampoo.  3.  After you shampoo, rinse your hair and body thoroughly to remove the  shampoo.                           4.  Use CHG as you would any other liquid soap.  You can apply chg directly  to the skin and wash  Gently with a scrungie or clean washcloth.  5.  Apply the CHG Soap to your body ONLY FROM THE NECK DOWN.   Do not use on face/ open                           Wound or open sores. Avoid contact with eyes, ears mouth and genitals  (private parts).                       Wash face,  Genitals (private parts) with your normal soap.             6.  Wash thoroughly, paying special attention to the area where your surgery  will be performed.  7.  Thoroughly rinse your body with warm water from the neck down.  8.  DO NOT shower/wash with your normal soap after using and rinsing off  the CHG Soap.                9.  Pat yourself dry with a clean towel.            10.  Wear clean pajamas.            11.  Place clean sheets on your bed the night of your first shower and do not  sleep with pets. Day of Surgery : Do not apply any lotions/deodorants the morning of surgery.  Please wear clean clothes to the hospital/surgery center.  FAILURE TO FOLLOW THESE INSTRUCTIONS MAY RESULT IN THE CANCELLATION OF YOUR SURGERY PATIENT SIGNATURE_________________________________  NURSE SIGNATURE__________________________________  ________________________________________________________________________

## 2017-12-28 ENCOUNTER — Other Ambulatory Visit: Payer: Self-pay

## 2017-12-28 ENCOUNTER — Inpatient Hospital Stay (HOSPITAL_COMMUNITY)
Admission: EM | Admit: 2017-12-28 | Discharge: 2018-01-15 | DRG: 193 | Disposition: A | Discharge status: Hospice | Payer: Medicare Other | Attending: Family Medicine | Admitting: Family Medicine

## 2017-12-28 ENCOUNTER — Encounter (HOSPITAL_COMMUNITY): Payer: Self-pay

## 2017-12-28 ENCOUNTER — Emergency Department (HOSPITAL_COMMUNITY): Payer: Medicare Other

## 2017-12-28 DIAGNOSIS — R911 Solitary pulmonary nodule: Secondary | ICD-10-CM | POA: Diagnosis not present

## 2017-12-28 DIAGNOSIS — R630 Anorexia: Secondary | ICD-10-CM | POA: Diagnosis not present

## 2017-12-28 DIAGNOSIS — C61 Malignant neoplasm of prostate: Secondary | ICD-10-CM | POA: Diagnosis not present

## 2017-12-28 DIAGNOSIS — E43 Unspecified severe protein-calorie malnutrition: Secondary | ICD-10-CM | POA: Diagnosis present

## 2017-12-28 DIAGNOSIS — R059 Cough, unspecified: Secondary | ICD-10-CM

## 2017-12-28 DIAGNOSIS — J440 Chronic obstructive pulmonary disease with acute lower respiratory infection: Secondary | ICD-10-CM | POA: Diagnosis present

## 2017-12-28 DIAGNOSIS — J918 Pleural effusion in other conditions classified elsewhere: Secondary | ICD-10-CM | POA: Diagnosis present

## 2017-12-28 DIAGNOSIS — Z681 Body mass index (BMI) 19 or less, adult: Secondary | ICD-10-CM | POA: Diagnosis not present

## 2017-12-28 DIAGNOSIS — Z9889 Other specified postprocedural states: Secondary | ICD-10-CM

## 2017-12-28 DIAGNOSIS — R627 Adult failure to thrive: Secondary | ICD-10-CM | POA: Diagnosis present

## 2017-12-28 DIAGNOSIS — Z79899 Other long term (current) drug therapy: Secondary | ICD-10-CM

## 2017-12-28 DIAGNOSIS — J9811 Atelectasis: Secondary | ICD-10-CM | POA: Diagnosis not present

## 2017-12-28 DIAGNOSIS — G8191 Hemiplegia, unspecified affecting right dominant side: Secondary | ICD-10-CM | POA: Diagnosis not present

## 2017-12-28 DIAGNOSIS — J181 Lobar pneumonia, unspecified organism: Principal | ICD-10-CM | POA: Diagnosis present

## 2017-12-28 DIAGNOSIS — I615 Nontraumatic intracerebral hemorrhage, intraventricular: Secondary | ICD-10-CM | POA: Diagnosis not present

## 2017-12-28 DIAGNOSIS — R0602 Shortness of breath: Secondary | ICD-10-CM

## 2017-12-28 DIAGNOSIS — I110 Hypertensive heart disease with heart failure: Secondary | ICD-10-CM | POA: Diagnosis not present

## 2017-12-28 DIAGNOSIS — K219 Gastro-esophageal reflux disease without esophagitis: Secondary | ICD-10-CM

## 2017-12-28 DIAGNOSIS — I5043 Acute on chronic combined systolic (congestive) and diastolic (congestive) heart failure: Secondary | ICD-10-CM | POA: Diagnosis present

## 2017-12-28 DIAGNOSIS — R000 Tachycardia, unspecified: Secondary | ICD-10-CM | POA: Diagnosis present

## 2017-12-28 DIAGNOSIS — I610 Nontraumatic intracerebral hemorrhage in hemisphere, subcortical: Secondary | ICD-10-CM | POA: Diagnosis not present

## 2017-12-28 DIAGNOSIS — R0682 Tachypnea, not elsewhere classified: Secondary | ICD-10-CM

## 2017-12-28 DIAGNOSIS — R4701 Aphasia: Secondary | ICD-10-CM | POA: Diagnosis not present

## 2017-12-28 DIAGNOSIS — Z8673 Personal history of transient ischemic attack (TIA), and cerebral infarction without residual deficits: Secondary | ICD-10-CM

## 2017-12-28 DIAGNOSIS — J90 Pleural effusion, not elsewhere classified: Secondary | ICD-10-CM

## 2017-12-28 DIAGNOSIS — F419 Anxiety disorder, unspecified: Secondary | ICD-10-CM | POA: Diagnosis present

## 2017-12-28 DIAGNOSIS — I428 Other cardiomyopathies: Secondary | ICD-10-CM | POA: Diagnosis present

## 2017-12-28 DIAGNOSIS — J189 Pneumonia, unspecified organism: Secondary | ICD-10-CM

## 2017-12-28 DIAGNOSIS — R4182 Altered mental status, unspecified: Secondary | ICD-10-CM

## 2017-12-28 DIAGNOSIS — J9601 Acute respiratory failure with hypoxia: Secondary | ICD-10-CM | POA: Diagnosis not present

## 2017-12-28 DIAGNOSIS — Z66 Do not resuscitate: Secondary | ICD-10-CM | POA: Diagnosis not present

## 2017-12-28 DIAGNOSIS — I619 Nontraumatic intracerebral hemorrhage, unspecified: Secondary | ICD-10-CM

## 2017-12-28 DIAGNOSIS — M5030 Other cervical disc degeneration, unspecified cervical region: Secondary | ICD-10-CM | POA: Diagnosis present

## 2017-12-28 DIAGNOSIS — R0600 Dyspnea, unspecified: Secondary | ICD-10-CM | POA: Diagnosis not present

## 2017-12-28 DIAGNOSIS — Z87891 Personal history of nicotine dependence: Secondary | ICD-10-CM

## 2017-12-28 DIAGNOSIS — Y95 Nosocomial condition: Secondary | ICD-10-CM | POA: Diagnosis present

## 2017-12-28 DIAGNOSIS — G92 Toxic encephalopathy: Secondary | ICD-10-CM | POA: Diagnosis not present

## 2017-12-28 DIAGNOSIS — Z86010 Personal history of colonic polyps: Secondary | ICD-10-CM

## 2017-12-28 DIAGNOSIS — Z973 Presence of spectacles and contact lenses: Secondary | ICD-10-CM

## 2017-12-28 DIAGNOSIS — I618 Other nontraumatic intracerebral hemorrhage: Secondary | ICD-10-CM | POA: Diagnosis not present

## 2017-12-28 DIAGNOSIS — D72829 Elevated white blood cell count, unspecified: Secondary | ICD-10-CM

## 2017-12-28 DIAGNOSIS — I10 Essential (primary) hypertension: Secondary | ICD-10-CM

## 2017-12-28 DIAGNOSIS — R188 Other ascites: Secondary | ICD-10-CM | POA: Diagnosis present

## 2017-12-28 DIAGNOSIS — E861 Hypovolemia: Secondary | ICD-10-CM | POA: Diagnosis present

## 2017-12-28 DIAGNOSIS — R110 Nausea: Secondary | ICD-10-CM | POA: Diagnosis not present

## 2017-12-28 DIAGNOSIS — D62 Acute posthemorrhagic anemia: Secondary | ICD-10-CM | POA: Diagnosis not present

## 2017-12-28 DIAGNOSIS — I2510 Atherosclerotic heart disease of native coronary artery without angina pectoris: Secondary | ICD-10-CM

## 2017-12-28 DIAGNOSIS — R918 Other nonspecific abnormal finding of lung field: Secondary | ICD-10-CM

## 2017-12-28 DIAGNOSIS — Z800 Family history of malignant neoplasm of digestive organs: Secondary | ICD-10-CM

## 2017-12-28 DIAGNOSIS — E871 Hypo-osmolality and hyponatremia: Secondary | ICD-10-CM | POA: Diagnosis present

## 2017-12-28 DIAGNOSIS — Z5320 Procedure and treatment not carried out because of patient's decision for unspecified reasons: Secondary | ICD-10-CM | POA: Diagnosis present

## 2017-12-28 DIAGNOSIS — Z7982 Long term (current) use of aspirin: Secondary | ICD-10-CM

## 2017-12-28 DIAGNOSIS — I5021 Acute systolic (congestive) heart failure: Secondary | ICD-10-CM | POA: Diagnosis not present

## 2017-12-28 DIAGNOSIS — G919 Hydrocephalus, unspecified: Secondary | ICD-10-CM | POA: Diagnosis not present

## 2017-12-28 DIAGNOSIS — R05 Cough: Secondary | ICD-10-CM

## 2017-12-28 DIAGNOSIS — N179 Acute kidney failure, unspecified: Secondary | ICD-10-CM | POA: Diagnosis not present

## 2017-12-28 DIAGNOSIS — J158 Pneumonia due to other specified bacteria: Secondary | ICD-10-CM | POA: Diagnosis not present

## 2017-12-28 DIAGNOSIS — A319 Mycobacterial infection, unspecified: Secondary | ICD-10-CM | POA: Diagnosis present

## 2017-12-28 DIAGNOSIS — D638 Anemia in other chronic diseases classified elsewhere: Secondary | ICD-10-CM | POA: Diagnosis present

## 2017-12-28 DIAGNOSIS — R29703 NIHSS score 3: Secondary | ICD-10-CM | POA: Diagnosis not present

## 2017-12-28 DIAGNOSIS — F0390 Unspecified dementia without behavioral disturbance: Secondary | ICD-10-CM | POA: Diagnosis present

## 2017-12-28 DIAGNOSIS — I509 Heart failure, unspecified: Secondary | ICD-10-CM | POA: Diagnosis not present

## 2017-12-28 DIAGNOSIS — I340 Nonrheumatic mitral (valve) insufficiency: Secondary | ICD-10-CM | POA: Diagnosis not present

## 2017-12-28 DIAGNOSIS — R740 Nonspecific elevation of levels of transaminase and lactic acid dehydrogenase [LDH]: Secondary | ICD-10-CM | POA: Diagnosis not present

## 2017-12-28 DIAGNOSIS — R0902 Hypoxemia: Secondary | ICD-10-CM | POA: Diagnosis not present

## 2017-12-28 DIAGNOSIS — Z8249 Family history of ischemic heart disease and other diseases of the circulatory system: Secondary | ICD-10-CM

## 2017-12-28 DIAGNOSIS — Z515 Encounter for palliative care: Secondary | ICD-10-CM | POA: Diagnosis not present

## 2017-12-28 DIAGNOSIS — J449 Chronic obstructive pulmonary disease, unspecified: Secondary | ICD-10-CM | POA: Diagnosis not present

## 2017-12-28 DIAGNOSIS — R109 Unspecified abdominal pain: Secondary | ICD-10-CM

## 2017-12-28 LAB — BASIC METABOLIC PANEL
ANION GAP: 11 (ref 5–15)
ANION GAP: 12 (ref 5–15)
Anion gap: 11 (ref 5–15)
BUN: 10 mg/dL (ref 6–20)
BUN: 9 mg/dL (ref 6–20)
BUN: 9 mg/dL (ref 6–20)
CALCIUM: 8.5 mg/dL — AB (ref 8.9–10.3)
CHLORIDE: 89 mmol/L — AB (ref 101–111)
CO2: 23 mmol/L (ref 22–32)
CO2: 22 mmol/L (ref 22–32)
CO2: 21 mmol/L — AB (ref 22–32)
Calcium: 8.6 mg/dL — ABNORMAL LOW (ref 8.9–10.3)
Calcium: 8.5 mg/dL — ABNORMAL LOW (ref 8.9–10.3)
Chloride: 91 mmol/L — ABNORMAL LOW (ref 101–111)
Chloride: 90 mmol/L — ABNORMAL LOW (ref 101–111)
Creatinine, Ser: 0.66 mg/dL (ref 0.61–1.24)
Creatinine, Ser: 0.64 mg/dL (ref 0.61–1.24)
Creatinine, Ser: 0.65 mg/dL (ref 0.61–1.24)
GFR calc Af Amer: >60 mL/min (ref >60)
GFR calc Af Amer: >60 mL/min (ref >60)
GFR calc Af Amer: >60 mL/min (ref >60)
GFR calc non Af Amer: >60 mL/min (ref >60)
GFR calc non Af Amer: >60 mL/min (ref >60)
GFR calc non Af Amer: >60 mL/min (ref >60)
GLUCOSE: 103 mg/dL — AB (ref 65–99)
GLUCOSE: 112 mg/dL — AB (ref 65–99)
GLUCOSE: 126 mg/dL — AB (ref 65–99)
POTASSIUM: 4.5 mmol/L (ref 3.5–5.1)
Potassium: 4.6 mmol/L (ref 3.5–5.1)
Potassium: 4.4 mmol/L (ref 3.5–5.1)
Sodium: 123 mmol/L — ABNORMAL LOW (ref 135–145)
Sodium: 124 mmol/L — ABNORMAL LOW (ref 135–145)
Sodium: 123 mmol/L — ABNORMAL LOW (ref 135–145)

## 2017-12-28 LAB — CBC
HEMATOCRIT: 30.5 % — AB (ref 39.0–52.0)
Hemoglobin: 10.6 g/dL — ABNORMAL LOW (ref 13.0–17.0)
MCH: 26.5 pg (ref 26.0–34.0)
MCHC: 34.8 g/dL (ref 30.0–36.0)
MCV: 76.3 fL — AB (ref 78.0–100.0)
Platelets: 454 10*3/uL — ABNORMAL HIGH (ref 150–400)
RBC: 4 MIL/uL — ABNORMAL LOW (ref 4.22–5.81)
RDW: 12.7 % (ref 11.5–15.5)
WBC: 9.5 10*3/uL (ref 4.0–10.5)

## 2017-12-28 LAB — I-STAT TROPONIN, ED: Troponin i, poc: 0.01 ng/mL (ref 0.00–0.08)

## 2017-12-28 LAB — I-STAT CG4 LACTIC ACID, ED: LACTIC ACID, VENOUS: 0.96 mmol/L (ref 0.5–1.9)

## 2017-12-28 MED ORDER — VANCOMYCIN HCL 500 MG IV SOLR
500.0000 mg | Freq: Two times a day (BID) | INTRAVENOUS | Status: DC
  Administered 2017-12-29: 500 mg via INTRAVENOUS
  Filled 2017-12-28: qty 500

## 2017-12-28 MED ORDER — ACETAMINOPHEN 325 MG PO TABS
650.0000 mg | ORAL_TABLET | Freq: Four times a day (QID) | ORAL | Status: DC | PRN
  Administered 2017-12-28 – 2018-01-06 (×16): 650 mg via ORAL
  Filled 2017-12-28 (×18): qty 2

## 2017-12-28 MED ORDER — ACETAMINOPHEN 650 MG RE SUPP: 650.0000 mg | Freq: Four times a day (QID) | RECTAL | Status: DC | PRN

## 2017-12-28 MED ORDER — BENZONATATE 100 MG PO CAPS
100.0000 mg | ORAL_CAPSULE | Freq: Once | ORAL | Status: AC
  Administered 2017-12-28: 100 mg via ORAL
  Filled 2017-12-28: qty 1

## 2017-12-28 MED ORDER — ENOXAPARIN SODIUM 40 MG/0.4ML ~~LOC~~ SOLN
40.0000 mg | SUBCUTANEOUS | Status: DC
  Administered 2017-12-28: 40 mg via SUBCUTANEOUS
  Filled 2017-12-28: qty 0.4

## 2017-12-28 MED ORDER — SODIUM CHLORIDE 0.9 % IV SOLN
1.0000 g | Freq: Three times a day (TID) | INTRAVENOUS | Status: DC
Start: 2017-12-28 — End: 2017-12-29
  Administered 2017-12-28 – 2017-12-29 (×2): 1 g via INTRAVENOUS
  Filled 2017-12-28 (×3): qty 1

## 2017-12-28 MED ORDER — SODIUM CHLORIDE 0.9% FLUSH
3.0000 mL | Freq: Two times a day (BID) | INTRAVENOUS | Status: DC
  Administered 2017-12-28 – 2018-01-08 (×16): 3 mL via INTRAVENOUS

## 2017-12-28 MED ORDER — CEFEPIME HCL 2 G IJ SOLR
2.0000 g | Freq: Once | INTRAMUSCULAR | Status: AC
  Administered 2017-12-28: 2 g via INTRAVENOUS
  Filled 2017-12-28: qty 2

## 2017-12-28 MED ORDER — SODIUM CHLORIDE 0.9 % IV SOLN
INTRAVENOUS | Status: DC
  Administered 2017-12-28: 75 mL/h via INTRAVENOUS
  Administered 2017-12-29 – 2017-12-30 (×2): 50 mL/h via INTRAVENOUS

## 2017-12-28 MED ORDER — VANCOMYCIN HCL IN DEXTROSE 1-5 GM/200ML-% IV SOLN
1000.0000 mg | Freq: Once | INTRAVENOUS | Status: AC
  Administered 2017-12-28: 1000 mg via INTRAVENOUS
  Filled 2017-12-28: qty 200

## 2017-12-28 MED ORDER — ONDANSETRON HCL 4 MG PO TABS: 4.0000 mg | ORAL_TABLET | Freq: Four times a day (QID) | ORAL | Status: DC | PRN

## 2017-12-28 MED ORDER — ASPIRIN 81 MG PO CHEW
81.0000 mg | CHEWABLE_TABLET | Freq: Every day | ORAL | Status: DC
  Administered 2017-12-28 – 2018-01-06 (×10): 81 mg via ORAL
  Filled 2017-12-28 (×10): qty 1

## 2017-12-28 MED ORDER — SODIUM CHLORIDE 0.9 % IV SOLN
1.0000 g | Freq: Once | INTRAVENOUS | Status: DC
  Filled 2017-12-28: qty 1

## 2017-12-28 MED ORDER — ONDANSETRON HCL 4 MG/2ML IJ SOLN
4.0000 mg | Freq: Four times a day (QID) | INTRAMUSCULAR | Status: DC | PRN
  Administered 2017-12-30 – 2018-01-03 (×2): 4 mg via INTRAVENOUS
  Filled 2017-12-28 (×5): qty 2

## 2017-12-28 MED ORDER — SODIUM CHLORIDE 0.9 % IV BOLUS
500.0000 mL | Freq: Once | INTRAVENOUS | Status: AC
  Administered 2017-12-28: 500 mL via INTRAVENOUS

## 2017-12-28 NOTE — Progress Notes (Signed)
Pharmacy Antibiotic Note  [name] is a 67 y.o. male admitted on 12/28/2017 with pneumonia.  Pharmacy has been consulted for vancomycin dosing.  Plan: Vancomycin 1000mg  IV once then 500mg  every 12 hours.  Goal trough 15-20 mcg/mL. Monitor clinical progression and LOT Order levels as appropriate  Height: 5\' 4"  (162.6 cm) Weight: 110 lb (49.9 kg) IBW/kg (Calculated) : 59.2  Temp (24hrs), Avg:97.9 F (36.6 C), Min:97.9 F (36.6 C), Max:97.9 F (36.6 C)  Recent Labs  Lab 12/28/17 1440  WBC 9.5  CREATININE 0.66    Estimated Creatinine Clearance: 63.2 mL/min (by C-G formula based on SCr of 0.66 mg/dL).    No Known Allergies  Thank you for allowing pharmacy to be a part of this patient's care.  Jodean Lima Breelynn Bankert 12/28/2017 4:18 PM

## 2017-12-28 NOTE — H&P (Addendum)
History and Physical  [name] WNU:[id number] DOB: [birth year] DOA: 12/28/2017  PCP: Bernerd Limbo, MD   Chief Complaint: Cough, shortness of breath  HPI:  67 year old man PMH former smoker, subclinical COPD, prostate cancer who presented with worsening shortness of breath and cough.  Chest x-ray showed developing infiltrates in the upper and lower lung zones, patient was started on antibiotics and referred for admission.  Patient was admitted 4/8-4/13 for progressive shortness of breath and productive cough.  CT chest showed multiple lung nodules of unknown etiology and the patient was seen by infectious disease.  Pulmonology was consulted for possible bronchoscopy but lesions were too peripheral.  Patient underwent CT-guided lung nodule biopsy (possibilities include resolving pneumonia or less likely inflammatory myofibroblastic tumor, not concerning for lymphoproliferative disorder. 3 sets of AFB smears were negative.  Infectious disease recommended discontinuing antibiotics and follow-up as an outpatient, especially culture and pathology data.  In detailed history, the patient's only known risk factor for tuberculosis was remote incarceration.  No significant travel history.  See Dr. Lucianne Lei Dam's note from 4/9 for more details.  He was seen in the outpatient setting by Dr. Megan Salon 4/16 at which time the only isolate from his sputum was Candida albicans which was representative of normal respiratory flora.  He presented today 4/20 with several day history of worsening shortness of breath, cough, without specific aggravating or alleviating factors.  Associated shortness of breath.  Symptoms are becoming worse.  Of note AFB culture obtained April 9, was reported positive today 4/20, negative M tuberculosis complex, other Mycobacterium species pending.  Chest x-ray today shows developing infiltrates in the upper and lower lung fields.  ED Course: Patient was treated with empiric  antibiotics  Review of Systems:  Negative for fever, new visual changes, rash, new muscle aches, chest pain,  dysuria, bleeding, n/v  Positive for sore throat, shortness of breath.  He has some abdominal pain from coughing.  Past Medical History:  Diagnosis Date  . BPH with urinary obstruction   . CAD (coronary artery disease)    per cardiac cath 01-08-2003  non-obstructive mild cad w/ normal LVSF  . COPD (chronic obstructive pulmonary disease) ([place])    per pulmologist note in 2009  GOLD 1  . DDD (degenerative disc disease), cervical    C5-7, C7-T1  . Diverticulosis of colon   . Elevated PSA   . GERD (gastroesophageal reflux disease)   . History of adenomatous polyp of colon   . History of hypertension   . History of precordial chest pain    as of 07-22-2017  denies cardiac S&S  . Prostate cancer ([place])   . Wears glasses     Past Surgical History:  Procedure Laterality Date  . CARDIAC CATHETERIZATION  01-08-2003   dr Lyndel Safe   mild non-obstructive CAD,  normal LVSF, ef 55% (positive myoview)  . COLONOSCOPY  last one 12-14-2015  . PROSTATE BIOPSY N/A 07/25/2017   Procedure: BIOPSY TRANSRECTAL ULTRASONIC PROSTATE (TUBP);  Surgeon: Alexis Frock, MD;  Location: [facility];  Service: Urology;  Laterality: N/A;     reports that he quit smoking about 30 years ago. His smoking use included cigarettes. He has a 30.00 pack-year smoking history. He quit smokeless tobacco use about 30 years ago. His smokeless tobacco use included snuff. He reports that he does not drink alcohol or use drugs.   No Known Allergies  Family History  Problem Relation Age of Onset  . Heart disease Brother   . Colon  cancer Brother   . Heart disease Sister   . Cancer Sister        unknown  . Cancer Mother        unknown     Prior to Admission medications   Medication Sig Start Date End Date Taking? Authorizing Provider  acetaminophen (TYLENOL) 500 MG tablet Take 500 mg by mouth  every 6 (six) hours as needed for headache (pain).    Yes [provider]  albuterol (PROVENTIL HFA;VENTOLIN HFA) 108 (90 Base) MCG/ACT inhaler Inhale 2 puffs into the lungs every 6 (six) hours as needed for wheezing or shortness of breath. 12/21/17  Yes Shelly Coss, MD  aspirin 81 MG chewable tablet Chew 1 tablet (81 mg total) by mouth daily. 11/27/16  Yes Fredia Sorrow, MD  dextromethorphan-guaiFENesin [facility] DM) 30-600 MG 12hr tablet Take 1 tablet by mouth 2 (two) times daily. 12/21/17  Yes Shelly Coss, MD  Multiple Vitamin (MULTIVITAMIN WITH MINERALS) TABS tablet Take 1 tablet by mouth daily.   Yes [provider]  OVER THE COUNTER MEDICATION Take 1 tablet by mouth 2 (two) times daily. Super Beta Prostate supplement   Yes [provider]  OVER THE COUNTER MEDICATION Apply 1 application topically daily as needed (arthritis pain). Over the counter arthritis pain cream   Yes [provider]  sildenafil (REVATIO) 20 MG tablet Take 60 mg by mouth daily as needed (erectile dysfunction).  09/24/17  Yes [provider]  benzonatate (TESSALON) 100 MG capsule Take 1 capsule (100 mg total) by mouth every 8 (eight) hours. Patient not taking: Reported on 12/08/2017 11/14/17   Varney Biles, MD  traMADol (ULTRAM) 50 MG tablet Take 1 tablet (50 mg total) by mouth every 6 (six) hours as needed. Patient not taking: Reported on 12/08/2017 11/29/17   Milton Ferguson, MD    Physical Exam:  Constitutional:   97.9, 18, 150/97, 95% room air, appears calm and comfortable, nontoxic Eyes:  pupils and irises appear normal Normal lids  ENMT:  grossly normal hearing  Lips appear normal Neck:  neck appears normal, no masses no thyromegaly Respiratory:  CTA bilaterally, no w/r/r.  Respiratory effort normal Cardiovascular:  RRR, no m/r/g No LE extremity edema  Radial pulses 2+ bilaterally Abdomen:  Abdomen appears normal; no tenderness or  masses Musculoskeletal:  Digits/nails BUE: no clubbing, cyanosis, petechiae, infection RUE, LUE, RLE, LLE   strength and tone normal, no atrophy, no abnormal movements No tenderness, masses Skin:  No rashes, lesions, ulcers palpation of skin: no induration or nodules Psychiatric:  Mental status Mood, affect appropriate judgement and insight appear normal   I have personally reviewed following labs and imaging studies  Labs:   Sodium 123, chloride 89.  BUN and creatinine within normal limits.  Potassium within normal limits.  Troponin negative  WBC within normal limits.  Hemoglobin stable 10.6.  Platelets 454.  Imaging studies:   Chest x-ray independently reviewed shows developing infiltrates in the upper and lower lung fields on the lateral view.  Medical tests:   EKG independently reviewed: Sinus tachycardia, no acute changes  Review and summation of old records:   As in HPI.  Principal Problem:   Lobar pneumonia ([place]) Active Problems:   G E R D   Lung nodules   Hyponatremia   Assessment/Plan Lobar pneumonia. Chest x-ray clearly different from previous, given worsening symptoms including shortness of breath and cough, I suspect developing pneumonia. --Empiric antibiotics, sputum culture, pneumonia protocol.  Numerous nodular foci throughout the  lungs. --Etiology remains unclear.  Infectious disease will see 4/21 for further recommendations.  Pathology results as in HPI, etiology remains obscure. --Discussed with Dr. Baxter Flattery, given the patient's positive AFB, even though it is not Mycobacterium tuberculum, she recommends placement in negative pressure room for the time being. --Differential includes malignancy, vasculitis (c-ANCA was positive at 1:80).  Hyponatremia, likely hypovolemic and poor solute intake by history.  Completely asymptomatic. --Anticipate will do well with IV saline, serial BMP.  Prostate cancer.  Patient declined treatment in the  past.  GERD.  PPI.  Subclinical COPD.  Severity of Illness: The appropriate patient status for this patient is INPATIENT. Inpatient status is judged to be reasonable and necessary in order to provide the required intensity of service to ensure the patient's safety. The patient's presenting symptoms, physical exam findings, and initial radiographic and laboratory data in the context of their chronic comorbidities is felt to place them at high risk for further clinical deterioration. Furthermore, it is not anticipated that the patient will be medically stable for discharge from the hospital within 2 midnights of admission. The following factors support the patient status of inpatient.   As above  * I certify that at the point of admission it is my clinical judgment that the patient will require inpatient hospital care spanning beyond 2 midnights from the point of admission due to high intensity of service, high risk for further deterioration and high frequency of surveillance required.*     DVT prophylaxis: Enoxaparin Code Status: Full Family Communication: None Consults called: ID    Time spent: 80 minutes  Murray Hodgkins, MD  [facility] Direct contact: [phone] --[place]ia [place]  --[email or web address]; password [email or web address]  7PM-7AM contact night coverage as above  12/28/2017, 6:42 PM

## 2017-12-28 NOTE — ED Triage Notes (Signed)
Pt reports he was recently d/c for pneumonia. Pt reports "when I get to coughing I can't breathe and can't rest at night, feels like Im cracking my ribs".

## 2017-12-28 NOTE — ED Provider Notes (Signed)
[facility] EMERGENCY DEPARTMENT Provider Note   CSN: [id number] Arrival date & time: 12/28/17  1422     History   Chief Complaint Chief Complaint  Patient presents with  . Chest Pain  . Cough    HPI Chiron Campione is a 67 y.o. male.  HPI   67 year old male with history of CAD, COPD, GERD, prostate cancer presenting for evaluation of chest pain and cough.  Patient was recently admitted to the hospital on 12/16/2017 and was discharged on 12/21/2017 for pneumonia.  Discharge initially was concern for fungal pneumonia versus tuberculosis versus metastatic prostate cancer.  Specimen was sent for culture and result  is not available yet.  Patient returned again to the ED complaining of persistent cough.  He mentioned that his cough has become much more labored and is worse at nighttime keeping him up from sleep.  Cough is nonproductive.  He endorsed chills.  He complaining of pleuritic pain from his cough.  He also endorsed night sweats and when he coughs a lot he complains of shortness of breath.  He is currently taking Mucinex that was prescribed to him.  He denies nausea vomiting diarrhea.    Past Medical History:  Diagnosis Date  . BPH with urinary obstruction   . CAD (coronary artery disease)    per cardiac cath 01-08-2003  non-obstructive mild cad w/ normal LVSF  . COPD (chronic obstructive pulmonary disease) ([place])    per pulmologist note in 2009  GOLD 1  . DDD (degenerative disc disease), cervical    C5-7, C7-T1  . Diverticulosis of colon   . Elevated PSA   . GERD (gastroesophageal reflux disease)   . History of adenomatous polyp of colon   . History of hypertension   . History of precordial chest pain    as of 07-22-2017  denies cardiac S&S  . Prostate cancer ([place])   . Wears glasses     Patient Active Problem List   Diagnosis Date Noted  . History of lung biopsy   . Hypoxemia   . CAP (community acquired pneumonia) 12/17/2017  . Normocytic  normochromic anemia 12/17/2017  . Lung nodule 12/17/2017  . Weight loss   . Prostate cancer ([place]) 10/15/2017  . Medicare annual wellness visit, subsequent 08/03/2016  . Screening for AAA (abdominal aortic aneurysm) 08/03/2016  . Need for hepatitis C screening test 08/03/2016  . Elevated prostate specific antigen (PSA) 11/25/2015  . 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 Screening for colon cancer 10/28/2015  . Screening for cardiovascular condition 10/28/2015  . HYPERTENSION 12/24/2007  . Essential (primary) hypertension 12/24/2007  . ASTHMA 12/23/2007  . C O P D 12/23/2007  . G E R D 12/23/2007  . Chronic obstructive pulmonary disease ([place]) 12/23/2007  . Acid reflux 12/23/2007  . Asthma 12/23/2007    Past Surgical History:  Procedure Laterality Date  . CARDIAC CATHETERIZATION  01-08-2003   dr Lyndel Safe   mild non-obstructive CAD,  normal LVSF, ef 55% (positive myoview)  . COLONOSCOPY  last one 12-14-2015  . PROSTATE BIOPSY N/A 07/25/2017   Procedure: BIOPSY TRANSRECTAL ULTRASONIC PROSTATE (TUBP);  Surgeon: Alexis Frock, MD;  Location: [facility];  Service: Urology;  Laterality: N/A;        Home Medications    Prior to Admission medications   Medication Sig Start Date End Date Taking? Authorizing Provider  acetaminophen (TYLENOL) 500 MG tablet Take 500 mg by mouth every 6 (six) hours as needed for  headache (pain).     [provider]  albuterol (PROVENTIL HFA;VENTOLIN HFA) 108 (90 Base) MCG/ACT inhaler Inhale 2 puffs into the lungs every 6 (six) hours as needed for wheezing or shortness of breath. 12/21/17   Shelly Coss, MD  aspirin 81 MG chewable tablet Chew 1 tablet (81 mg total) by mouth daily. 11/27/16   Fredia Sorrow, MD  benzonatate (TESSALON) 100 MG capsule Take 1 capsule (100 mg total) by mouth every 8 (eight) hours. Patient not taking: Reported on 12/08/2017 11/14/17   Varney Biles, MD    dextromethorphan-guaiFENesin [facility] DM) 30-600 MG 12hr tablet Take 1 tablet by mouth 2 (two) times daily. 12/21/17   Shelly Coss, MD  Multiple Vitamin (MULTIVITAMIN WITH MINERALS) TABS tablet Take 1 tablet by mouth daily.    [provider]  OVER THE COUNTER MEDICATION Take 1 tablet by mouth 2 (two) times daily. Super Beta Prostate supplement    [provider]  OVER THE COUNTER MEDICATION Apply 1 application topically daily as needed (arthritis pain).    [provider]  sildenafil (REVATIO) 20 MG tablet Take 60 mg by mouth daily as needed (erectile dysfunction).  09/24/17   [provider]  traMADol (ULTRAM) 50 MG tablet Take 1 tablet (50 mg total) by mouth every 6 (six) hours as needed. Patient not taking: Reported on 12/08/2017 11/29/17   Milton Ferguson, MD    Family History Family History  Problem Relation Age of Onset  . Heart disease Brother   . Colon cancer Brother   . Heart disease Sister   . Cancer Sister        unknown  . Cancer Mother        unknown    Social History Social History   Tobacco Use  . Smoking status: Former Smoker    Packs/day: 1.50    Years: 20.00    Pack years: 30.00    Types: Cigarettes    Last attempt to quit: 07/23/1987    Years since quitting: 30.4  . Smokeless tobacco: Former Systems developer    Types: Snuff    Quit date: 07/23/1987  Substance Use Topics  . Alcohol use: No    Frequency: Never  . Drug use: No     Allergies   Patient has no known allergies.   Review of Systems Review of Systems  All other systems reviewed and are negative.    Physical Exam Updated Vital Signs BP (!) 160/100 (BP Location: Left Arm)   Pulse (!) 108   Temp 97.9 F (36.6 C) (Oral)   Resp 18   Ht 5\' 4"  (1.626 m)   Wt 49.9 kg (110 lb)   SpO2 97%   BMI 18.88 kg/m   Physical Exam  Constitutional: He appears well-developed and well-nourished. No distress.  Elderly male frail appearing  HENT:  Head: Atraumatic.   Eyes: Conjunctivae are normal.  Neck: Neck supple.  Cardiovascular: Intact distal pulses and normal pulses. Tachycardia present. Exam reveals no gallop and no friction rub.    No systolic murmur is present. Pulmonary/Chest: Effort normal and breath sounds normal.  Abdominal: Soft. There is tenderness (Mild tenderness to epigastric region).  Musculoskeletal: Normal range of motion.       Right lower leg: He exhibits no edema.       Left lower leg: He exhibits no edema.  Lymphadenopathy:    He has no cervical adenopathy.  Neurological: He is alert. GCS eye subscore is 4. GCS verbal subscore is 5. GCS  motor subscore is 6.  Skin: Skin is warm. Capillary refill takes less than 2 seconds. No rash noted.  Psychiatric: He has a normal mood and affect.  Nursing note and vitals reviewed.    ED Treatments / Results  Labs (all labs ordered are listed, but only abnormal results are displayed) Labs Reviewed  BASIC METABOLIC PANEL - Abnormal; Notable for the following components:      Result Value   Sodium 123 (*)    Chloride 89 (*)    Glucose, Bld 103 (*)    Calcium 8.6 (*)    All other components within normal limits  CBC - Abnormal; Notable for the following components:   RBC 4.00 (*)    Hemoglobin 10.6 (*)    HCT 30.5 (*)    MCV 76.3 (*)    Platelets 454 (*)    All other components within normal limits  I-STAT TROPONIN, ED  I-STAT CG4 LACTIC ACID, ED    EKG EKG Interpretation  Date/Time:  Saturday December 28 2017 15:03:26 EDT Ventricular Rate:  111 PR Interval:  122 QRS Duration: 80 QT Interval:  334 QTC Calculation: 454 R Axis:   -41 Text Interpretation:  Sinus tachycardia with Premature atrial complexes Left axis deviation Moderate voltage criteria for LVH, may be normal variant Nonspecific ST and T wave abnormality Abnormal ECG No significant change since last tracing Confirmed by Dorie Rank [phone]) on 12/28/2017 5:00:52 PM   Radiology Dg Chest 2 View  Result Date:  12/28/2017 CLINICAL DATA:  Chest pain and shortness of breath for 1 week EXAM: CHEST - 2 VIEW COMPARISON:  12/19/2017 FINDINGS: Cardiac shadow is within normal limits. Nodular densities are again identified throughout the lungs bilaterally. Small right-sided pleural effusion is noted new from the prior study. Some patchy changes are noted in the upper lobes bilaterally which may represent some early infiltrate. No pneumothorax is identified. IMPRESSION: Stable nodular densities bilaterally.  No pneumothorax is noted. New small right-sided pleural effusion. Patchy changes in the upper lobes bilaterally increased from the previous exam which may represent some early infiltrate. Electronically Signed   By: Inez Catalina M.D.   On: 12/28/2017 15:45    Procedures Procedures (including critical care time)  Medications Ordered in ED Medications  sodium chloride 0.9 % bolus 500 mL (500 mLs Intravenous New Bag/Given 12/28/17 1656)  vancomycin (VANCOCIN) IVPB 1000 mg/200 mL premix (has no administration in time range)  vancomycin (VANCOCIN) 500 mg in sodium chloride 0.9 % 100 mL IVPB (has no administration in time range)  ceFEPIme (MAXIPIME) 2 g in sodium chloride 0.9 % 100 mL IVPB (has no administration in time range)  benzonatate (TESSALON) capsule 100 mg (100 mg Oral Given 12/28/17 1648)     Initial Impression / Assessment and Plan / ED Course  I have reviewed the triage vital signs and the nursing notes.  Pertinent labs & imaging results that were available during my care of the patient were reviewed by me and considered in my medical decision making (see chart for details).     BP (!) 164/97   Pulse (!) 109   Temp 97.9 F (36.6 C) (Oral)   Resp 18   Ht 5\' 4"  (1.626 m)   Wt 49.9 kg (110 lb)   SpO2 96%   BMI 18.88 kg/m    Final Clinical Impressions(s) / ED Diagnoses   Final diagnoses:  HCAP (healthcare-associated pneumonia)  Hyponatremia  Pleural effusion    ED Discharge Orders  None     4:15 PM Patient presents for persistent cough since discharge from hospital on 4/13.  He was hospitalized for 5 days for pneumonia.  It is currently undifferentiated, his sputum culture has not resulted yet.  Does have history of prostate cancer.  Does endorse night sweats.  I am concerned that this could be malignancy given his history of prostate cancer.  Due to her recent hospitalization, and a chest x-ray does show patchy changes in the upper lobes bilaterally which has increased from previous exam and may represent some early infiltrate, patient will be covered with antibiotics for H CAP.  New small right pleural effusion were noted.He also has a low sodium of 123.  Will provide gentle hydration, and will consult for admission.\  4:58 PM Appreciate consultation from [facility] Dr. Sarajane Jews who agrees to see and admit pt for further care. Pt is aware of plan.     Domenic Moras, PA-C 12/28/17 1701    Dorie Rank, MD 12/28/17 [phone]

## 2017-12-28 NOTE — ED Notes (Signed)
Per admitting MD patient to be placed in a negative pressure room and airborne precautions taken.

## 2017-12-28 NOTE — ED Notes (Signed)
Bed control called concerning patient placement in negative pressure room.

## 2017-12-29 ENCOUNTER — Other Ambulatory Visit: Payer: Self-pay

## 2017-12-29 DIAGNOSIS — I2510 Atherosclerotic heart disease of native coronary artery without angina pectoris: Secondary | ICD-10-CM

## 2017-12-29 DIAGNOSIS — J449 Chronic obstructive pulmonary disease, unspecified: Secondary | ICD-10-CM

## 2017-12-29 DIAGNOSIS — C61 Malignant neoplasm of prostate: Secondary | ICD-10-CM

## 2017-12-29 DIAGNOSIS — Z87891 Personal history of nicotine dependence: Secondary | ICD-10-CM

## 2017-12-29 DIAGNOSIS — J158 Pneumonia due to other specified bacteria: Secondary | ICD-10-CM

## 2017-12-29 LAB — BASIC METABOLIC PANEL
ANION GAP: 10 (ref 5–15)
BUN: 8 mg/dL (ref 6–20)
CALCIUM: 8.6 mg/dL — AB (ref 8.9–10.3)
CO2: 24 mmol/L (ref 22–32)
CREATININE: 0.63 mg/dL (ref 0.61–1.24)
Chloride: 93 mmol/L — ABNORMAL LOW (ref 101–111)
GLUCOSE: 101 mg/dL — AB (ref 65–99)
Potassium: 4.3 mmol/L (ref 3.5–5.1)
Sodium: 127 mmol/L — ABNORMAL LOW (ref 135–145)

## 2017-12-29 LAB — CBC
HCT: 28.6 % — ABNORMAL LOW (ref 39.0–52.0)
Hemoglobin: 9.5 g/dL — ABNORMAL LOW (ref 13.0–17.0)
MCH: 25.7 pg — ABNORMAL LOW (ref 26.0–34.0)
MCHC: 33.2 g/dL (ref 30.0–36.0)
MCV: 77.5 fL — ABNORMAL LOW (ref 78.0–100.0)
PLATELETS: 619 10*3/uL — AB (ref 150–400)
RBC: 3.69 MIL/uL — AB (ref 4.22–5.81)
RDW: 12.5 % (ref 11.5–15.5)
WBC: 10.8 10*3/uL — AB (ref 4.0–10.5)

## 2017-12-29 LAB — STREP PNEUMONIAE URINARY ANTIGEN: STREP PNEUMO URINARY ANTIGEN: NEGATIVE

## 2017-12-29 LAB — MRSA PCR SCREENING: MRSA BY PCR: NEGATIVE

## 2017-12-29 MED ORDER — SODIUM CHLORIDE 0.9 % IV SOLN
8.0000 mg | INTRAVENOUS | Status: DC
  Administered 2017-12-29: 8 mg via INTRAVENOUS
  Filled 2017-12-29 (×2): qty 4

## 2017-12-29 MED ORDER — SODIUM CHLORIDE 0.9 % IV SOLN
8.0000 mg | Freq: Two times a day (BID) | INTRAVENOUS | Status: DC
  Administered 2017-12-30: 8 mg via INTRAVENOUS
  Filled 2017-12-29 (×2): qty 4

## 2017-12-29 MED ORDER — TIGECYCLINE 50 MG IV SOLR
50.0000 mg | Freq: Two times a day (BID) | INTRAVENOUS | Status: DC
  Administered 2017-12-30 – 2018-01-07 (×18): 50 mg via INTRAVENOUS
  Filled 2017-12-29 (×19): qty 100

## 2017-12-29 MED ORDER — TIGECYCLINE 50 MG IV SOLR
100.0000 mg | Freq: Once | INTRAVENOUS | Status: AC
  Administered 2017-12-29: 100 mg via INTRAVENOUS
  Filled 2017-12-29: qty 100

## 2017-12-29 MED ORDER — GUAIFENESIN ER 600 MG PO TB12
600.0000 mg | ORAL_TABLET | Freq: Two times a day (BID) | ORAL | Status: DC
  Administered 2017-12-29 – 2017-12-31 (×6): 600 mg via ORAL
  Filled 2017-12-29 (×6): qty 1

## 2017-12-29 MED ORDER — SODIUM CHLORIDE 0.9 % IV SOLN
2.0000 g | Freq: Four times a day (QID) | INTRAVENOUS | Status: DC
  Administered 2017-12-29 – 2018-01-03 (×21): 2 g via INTRAVENOUS
  Filled 2017-12-29 (×22): qty 2

## 2017-12-29 MED ORDER — SODIUM CHLORIDE 0.9 % IV SOLN
8.0000 mg | Freq: Two times a day (BID) | INTRAVENOUS | Status: DC
  Filled 2017-12-29: qty 4

## 2017-12-29 MED ORDER — GUAIFENESIN-DM 100-10 MG/5ML PO SYRP
5.0000 mL | ORAL_SOLUTION | ORAL | Status: DC | PRN
  Administered 2017-12-29 – 2017-12-31 (×13): 5 mL via ORAL
  Filled 2017-12-29 (×15): qty 5

## 2017-12-29 MED ORDER — SODIUM CHLORIDE 0.9 % IV SOLN
500.0000 mg | INTRAVENOUS | Status: DC
  Administered 2017-12-29 – 2018-01-09 (×12): 500 mg via INTRAVENOUS
  Filled 2017-12-29 (×12): qty 500

## 2017-12-29 MED ORDER — SODIUM CHLORIDE 0.9 % IV SOLN
50.0000 mg | Freq: Two times a day (BID) | INTRAVENOUS | Status: DC
  Filled 2017-12-29: qty 100

## 2017-12-29 MED ORDER — SODIUM CHLORIDE 0.9 % IV SOLN: 8.0000 mg | Freq: Once | INTRAVENOUS | Status: AC

## 2017-12-29 NOTE — Progress Notes (Addendum)
Patient ID: [name], male   DOB: April 12, 1951, 67 y.o.   MRN: [id number]  PROGRESS NOTE    Leavy Heatherly  DQQ:[id number] DOB: 14-Apr-1951 DOA: 12/28/2017  PCP: Bernerd Limbo, MD   Brief Narrative:  67 year old male with past medical history of COPD, prostate cancer, smoker who presented to [facility] with worsening shortness of breath and cough. Chest x-ray showed developing infiltrates in the upper and lower lung zones, patient was started on antibiotics and referred for admission.   Assessment & Plan:   Principal Problem:   Lobar pneumonia ([place]) / Leukocytosis  - CXR showed stable nodular densities bilaterally.  No pneumothorax noted. New small right-sided pleural effusion. Patchy changes in the upper lobes bilaterally increased from the previous exam which may represent some early infiltrate.  - Was started on vanco and cefepime, stopped vanco 4/21, continue cefepime   Active Problems:   Lung nodules - Etiology remains unclear - ID to see in am - Has had previous AFB that was positive - CT guided core lung biopsy of right nodule on 4/11 - inflammation and fibrosis with numerous plasma cells     Hyponatremia - Dehydration versus acute lung process - Continue to monitor BMP daily     Anemia of chronic disease - Hgb stable     Moderate protein calorie malnutrition / Failure to thrive in adult  - In the context of chronic illness - Diet as tolerated     DVT prophylaxis: SCD's  Code Status: full code  Family Communication: fiance at bedside Disposition Plan: not yet stable for discharge, awaiting ID consultation, following sodium level    Consultants:   ID  Procedures:   None  Antimicrobials:   Vanco 4/20 --> 4/21  Cefepime 4/20 -->   Subjective: No overnight events.  Objective: Vitals:   12/28/17 2000 12/28/17 2047 12/29/17 0049 12/29/17 0515  BP: (!) 148/95 (!) 165/92 (!) 135/95 (!) 163/97  Pulse: (!) 105 (!) 113 98 (!) 105  Resp: 20     Temp:  98.8 F  (37.1 C) 98 F (36.7 C) 97.6 F (36.4 C)  TempSrc:  Oral Oral Oral  SpO2: 98% 99% 100% 100%  Weight:  49.2 kg (108 lb 6.4 oz)  48.2 kg (106 lb 3.2 oz)  Height:        Intake/Output Summary (Last 24 hours) at 12/29/2017 1042 Last data filed at 12/29/2017 1000 Gross per 24 hour  Intake 563 ml  Output 500 ml  Net 63 ml   Filed Weights   12/28/17 1448 12/28/17 2047 12/29/17 0515  Weight: 49.9 kg (110 lb) 49.2 kg (108 lb 6.4 oz) 48.2 kg (106 lb 3.2 oz)    Examination:  General exam: Appears calm and comfortable  Respiratory system: Diminished breath sounds, no wheezing  Cardiovascular system: S1 & S2 heard, RRR.  Gastrointestinal system: Abdomen is nondistended, soft and nontender. No organomegaly or masses felt. Normal bowel sounds heard. Central nervous system: Alert and oriented. No focal neurological deficits. Extremities: Symmetric 5 x 5 power. Skin: No rashes, lesions or ulcers Psychiatry: Judgement and insight appear normal. Mood & affect appropriate.   Data Reviewed: I have personally reviewed following labs and imaging studies  CBC: Recent Labs  Lab 12/28/17 1440 12/29/17 0742  WBC 9.5 10.8*  HGB 10.6* 9.5*  HCT 30.5* 28.6*  MCV 76.3* 77.5*  PLT 454* 194*   Basic Metabolic Panel: Recent Labs  Lab 12/28/17 1440 12/28/17 2020 12/28/17 2254 12/29/17 0742  NA 123* 124* 123* 127*  K 4.5 4.6 4.4 4.3  CL 89* 91* 90* 93*  CO2 23 22 21* 24  GLUCOSE 103* 112* 126* 101*  BUN 10 9 9 8   CREATININE 0.66 0.64 0.65 0.63  CALCIUM 8.6* 8.5* 8.5* 8.6*   GFR: Estimated Creatinine Clearance: 61.1 mL/min (by C-G formula based on SCr of 0.63 mg/dL). Liver Function Tests: No results for input(s): AST, ALT, ALKPHOS, BILITOT, PROT, ALBUMIN in the last 168 hours. No results for input(s): LIPASE, AMYLASE in the last 168 hours. No results for input(s): AMMONIA in the last 168 hours. Coagulation Profile: No results for input(s): INR, PROTIME in the last 168 hours. Cardiac  Enzymes: No results for input(s): CKTOTAL, CKMB, CKMBINDEX, TROPONINI in the last 168 hours. BNP (last 3 results) No results for input(s): PROBNP in the last 8760 hours. HbA1C: No results for input(s): HGBA1C in the last 72 hours. CBG: No results for input(s): GLUCAP in the last 168 hours. Lipid Profile: No results for input(s): CHOL, HDL, LDLCALC, TRIG, CHOLHDL, LDLDIRECT in the last 72 hours. Thyroid Function Tests: No results for input(s): TSH, T4TOTAL, FREET4, T3FREE, THYROIDAB in the last 72 hours. Anemia Panel: No results for input(s): VITAMINB12, FOLATE, FERRITIN, TIBC, IRON, RETICCTPCT in the last 72 hours. Urine analysis: No results found for: COLORURINE, APPEARANCEUR, LABSPEC, PHURINE, GLUCOSEU, HGBUR, BILIRUBINUR, KETONESUR, PROTEINUR, UROBILINOGEN, NITRITE, LEUKOCYTESUR Sepsis Labs: @LABRCNTIP (procalcitonin:4,lacticidven:4)   Acid Fast Smear (AFB)     Status: None   Collection Time: 12/19/17  5:29 PM  Result Value Ref Range Status   AFB Specimen Processing Concentration  Final   Acid Fast Smear Negative  Final    Comment: (NOTE) Performed At: [facility], [place] [id number] Rush Farmer MD ZW:[id number]    Source (AFB) TISSUE  Final    Comment: RIGHT UPPER LUNG   Culture, fungus without smear     Status: None (Preliminary result)   Collection Time: 12/19/17  5:29 PM  Result Value Ref Range Status   Specimen Description TISSUE RIGHT UPPER LUNG  Final   Special Requests NONE  Final   Culture   Final    NO FUNGUS ISOLATED AFTER 7 DAYS Performed at [facility], [address] [place], [place] [address]    Report Status PENDING  Incomplete  Aerobic/Anaerobic Culture (surgical/deep wound)     Status: None   Collection Time: 12/19/17  5:29 PM  Result Value Ref Range Status   Specimen Description TISSUE RIGHT UPPER LUNG  Final   Special Requests NONE  Final   Gram Stain   Final    FEW WBC PRESENT,BOTH PMN AND  MONONUCLEAR NO ORGANISMS SEEN    Culture   Final    No growth aerobically or anaerobically. Performed at [facility], [place] [address], [place], [place] [address]    Report Status 12/24/2017 FINAL  Final  MRSA PCR Screening     Status: None   Collection Time: 12/28/17 10:46 PM  Result Value Ref Range Status   MRSA by PCR NEGATIVE NEGATIVE Final      Radiology Studies: Dg Chest 2 View Result Date: 12/28/2017 Stable nodular densities bilaterally.  No pneumothorax is noted. New small right-sided pleural effusion. Patchy changes in the upper lobes bilaterally increased from the previous exam which may represent some early infiltrate.     Scheduled Meds: . aspirin  81 mg Oral Daily  . enoxaparin   40 mg Subcutaneous Q24H   Continuous Infusions: . sodium chloride 75 mL/hr (12/28/17  2222)  . ceFEPime (MAXIPIME) IV 1 g (12/29/17 0515)     LOS: 1 day    Time spent: 25 minutes  Greater than 50% of the time spent on counseling and coordinating the care.   Leisa Lenz, MD [facility] Pager [phone]  If 7PM-7AM, please contact night-coverage [email or web address] Password [facility] 12/29/2017, 10:42 AM

## 2017-12-29 NOTE — Consult Note (Addendum)
[facility] for Infectious Disease  Total days of antibiotics 2        Day 2 vanco/cefepime               Reason for Consult: shortness of breath/pulmonary NTM infection    Referring Physician: goodrich  Principal Problem:   Lobar pneumonia ([place]) Active Problems:   G E R D   Lung nodules   Hyponatremia   HPI: [name] is a 67 y.o. male with hx of CAD, COPD, prostate ca admitted for worsening cough. He has been recently evaluated for new lung nodules, in early April, one of 3 specimnes found to be AFB+ at 2 wk mark from sputum specimen. He was seen by my partner, Dr Megan Salon on 4/16 where he still had productive cough, wheezing, right sided pleuretic chest pain. Also reported having malaise, fatigue, roughly 6-7 lb/in 1 month of weight loss. At that time,none of his cultures had isolated anything except psuedohyphae on one culture thought to be colonizer.   He was readmitted to the hospital on 4/20 for worsening,labored cough, unable to sleep. One of his cx came back yesterday for +AFb but mTB PCR negative. Other NTM PCR testing is pending. He reports this morning unable to produce phlegm, felt it was stuck. His daughter is at his bedside  Past Medical History:  Diagnosis Date  . BPH with urinary obstruction   . CAD (coronary artery disease)    per cardiac cath 01-08-2003  non-obstructive mild cad w/ normal LVSF  . COPD (chronic obstructive pulmonary disease) ([place])    per pulmologist note in 2009  GOLD 1  . DDD (degenerative disc disease), cervical    C5-7, C7-T1  . Diverticulosis of colon   . Elevated PSA   . GERD (gastroesophageal reflux disease)   . History of adenomatous polyp of colon   . History of hypertension   . History of precordial chest pain    as of 07-22-2017  denies cardiac S&S  . Prostate cancer ([place])   . Wears glasses     Allergies: No Known Allergies  MEDICATIONS: . aspirin  81 mg Oral Daily  . sodium chloride flush  3 mL Intravenous Q12H     Social History   Tobacco Use  . Smoking status: Former Smoker    Packs/day: 1.50    Years: 20.00    Pack years: 30.00    Types: Cigarettes    Last attempt to quit: 07/23/1987    Years since quitting: 30.4  . Smokeless tobacco: Former Systems developer    Types: Snuff    Quit date: 07/23/1987  Substance Use Topics  . Alcohol use: No    Frequency: Never  . Drug use: No    Family History  Problem Relation Age of Onset  . Heart disease Brother   . Colon cancer Brother   . Heart disease Sister   . Cancer Sister        unknown  . Cancer Mother        unknown    Review of Systems -   Constitutional: + intermittent chills/fever. +weight loss  Negative for diaphoresis, activity change, appetite change, fatigue  HENT: Negative for congestion, sore throat, rhinorrhea, sneezing, trouble swallowing and sinus pressure.  Eyes: Negative for photophobia and visual disturbance.  Respiratory: + for cough, chest tightness, shortness of breath, wheezing and stridor.  Cardiovascular: Negative for chest pain, palpitations and leg swelling.  Gastrointestinal: Negative for nausea, vomiting, abdominal pain, diarrhea, constipation,  blood in stool, abdominal distention and anal bleeding.  Genitourinary: Negative for dysuria, hematuria, flank pain and difficulty urinating.  Musculoskeletal: Negative for myalgias, back pain, joint swelling, arthralgias and gait problem.  Skin: Negative for color change, pallor, rash and wound.  Neurological: Negative for dizziness, tremors, weakness and light-headedness.  Hematological: Negative for adenopathy. Does not bruise/bleed easily.  Psychiatric/Behavioral: Negative for behavioral problems, confusion, sleep disturbance, dysphoric mood, decreased concentration and agitation.     OBJECTIVE: Temp:  [97.6 F (36.4 C)-98.8 F (37.1 C)] 98.3 F (36.8 C) (04/21 1145) Pulse Rate:  [95-113] 95 (04/21 1145) Resp:  [18-31] 20 (04/20 2000) BP: (65-165)/(35-100) 146/97  (04/21 1145) SpO2:  [95 %-100 %] 98 % (04/21 1145) Weight:  [106 lb 3.2 oz (48.2 kg)-110 lb (49.9 kg)] 106 lb 3.2 oz (48.2 kg) (04/21 0515) Physical Exam  Constitutional: He is oriented to person, place, and time. He appears stated age, undernourished/thin framed. No distress.  HENT:  Mouth/Throat: Oropharynx is clear and moist. No oropharyngeal exudate.  Cardiovascular: Normal rate, regular rhythm and normal heart sounds. Exam reveals no gallop and no friction rub.  No murmur heard.  Pulmonary/Chest: Effort normal and breath sounds normal. No respiratory distress. He has no wheezes. Poor respiratory effort Abdominal: Soft. Bowel sounds are normal. He exhibits no distension. There is no tenderness.  Lymphadenopathy:  He has no cervical adenopathy.  Neurological: He is alert and oriented to person, place, and time.  Skin: Skin is warm and dry. No rash noted. No erythema.  Psychiatric: He has a normal mood and affect. His behavior is normal.     LABS: Results for orders placed or performed during the hospital encounter of 12/28/17 (from the past 48 hour(s))  Basic metabolic panel     Status: Abnormal   Collection Time: 12/28/17  2:40 PM  Result Value Ref Range   Sodium 123 (L) 135 - 145 mmol/L   Potassium 4.5 3.5 - 5.1 mmol/L   Chloride 89 (L) 101 - 111 mmol/L   CO2 23 22 - 32 mmol/L   Glucose, Bld 103 (H) 65 - 99 mg/dL   BUN 10 6 - 20 mg/dL   Creatinine, Ser 0.66 0.61 - 1.24 mg/dL   Calcium 8.6 (L) 8.9 - 10.3 mg/dL   GFR calc non Af Amer >60 >60 mL/min   GFR calc Af Amer >60 >60 mL/min    Comment: (NOTE) The eGFR has been calculated using the CKD EPI equation. This calculation has not been validated in all clinical situations. eGFR's persistently <60 mL/min signify possible Chronic Kidney Disease.    Anion gap 11 5 - 15    Comment: Performed at [facility] [address] [place], [place] [address]  CBC     Status: Abnormal   Collection Time: 12/28/17  2:40 PM   Result Value Ref Range   WBC 9.5 4.0 - 10.5 K/uL   RBC 4.00 (L) 4.22 - 5.81 MIL/uL   Hemoglobin 10.6 (L) 13.0 - 17.0 g/dL   HCT 30.5 (L) 39.0 - 52.0 %   MCV 76.3 (L) 78.0 - 100.0 fL   MCH 26.5 26.0 - 34.0 pg   MCHC 34.8 30.0 - 36.0 g/dL   RDW 12.7 11.5 - 15.5 %   Platelets 454 (H) 150 - 400 K/uL    Comment: Performed at [facility] [address], [place], [place] [address]  I-stat troponin, ED     Status: None   Collection Time: 12/28/17  3:05  PM  Result Value Ref Range   Troponin i, poc 0.01 0.00 - 0.08 ng/mL   Comment 3            Comment: Due to the release kinetics of cTnI, a negative result within the first hours of the onset of symptoms does not rule out myocardial infarction with certainty. If myocardial infarction is still suspected, repeat the test at appropriate intervals.   I-Stat CG4 Lactic Acid, ED     Status: None   Collection Time: 12/28/17  6:38 PM  Result Value Ref Range   Lactic Acid, Venous 0.96 0.5 - 1.9 mmol/L  Basic metabolic panel     Status: Abnormal   Collection Time: 12/28/17  8:20 PM  Result Value Ref Range   Sodium 124 (L) 135 - 145 mmol/L   Potassium 4.6 3.5 - 5.1 mmol/L   Chloride 91 (L) 101 - 111 mmol/L   CO2 22 22 - 32 mmol/L   Glucose, Bld 112 (H) 65 - 99 mg/dL   BUN 9 6 - 20 mg/dL   Creatinine, Ser 0.64 0.61 - 1.24 mg/dL   Calcium 8.5 (L) 8.9 - 10.3 mg/dL   GFR calc non Af Amer >60 >60 mL/min   GFR calc Af Amer >60 >60 mL/min    Comment: (NOTE) The eGFR has been calculated using the CKD EPI equation. This calculation has not been validated in all clinical situations. eGFR's persistently <60 mL/min signify possible Chronic Kidney Disease.    Anion gap 11 5 - 15    Comment: Performed at [facility] [address] [place], [place] [address]  MRSA PCR Screening     Status: None   Collection Time: 12/28/17 10:46 PM  Result Value Ref Range   MRSA by PCR NEGATIVE NEGATIVE    Comment:        The GeneXpert MRSA Assay  (FDA approved for NASAL specimens only), is one component of a comprehensive MRSA colonization surveillance program. It is not intended to diagnose MRSA infection nor to guide or monitor treatment for MRSA infections. Performed at [facility], [place] [address], [place], [place] [address]   Basic metabolic panel     Status: Abnormal   Collection Time: 12/28/17 10:54 PM  Result Value Ref Range   Sodium 123 (L) 135 - 145 mmol/L   Potassium 4.4 3.5 - 5.1 mmol/L   Chloride 90 (L) 101 - 111 mmol/L   CO2 21 (L) 22 - 32 mmol/L   Glucose, Bld 126 (H) 65 - 99 mg/dL   BUN 9 6 - 20 mg/dL   Creatinine, Ser 0.65 0.61 - 1.24 mg/dL   Calcium 8.5 (L) 8.9 - 10.3 mg/dL   GFR calc non Af Amer >60 >60 mL/min   GFR calc Af Amer >60 >60 mL/min    Comment: (NOTE) The eGFR has been calculated using the CKD EPI equation. This calculation has not been validated in all clinical situations. eGFR's persistently <60 mL/min signify possible Chronic Kidney Disease.    Anion gap 12 5 - 15    Comment: Performed at [facility] [address], [place], [place] [address]  Basic metabolic panel     Status: Abnormal   Collection Time: 12/29/17  7:42 AM  Result Value Ref Range   Sodium 127 (L) 135 - 145 mmol/L   Potassium 4.3 3.5 - 5.1 mmol/L   Chloride 93 (L) 101 - 111 mmol/L   CO2 24 22 - 32 mmol/L  Glucose, Bld 101 (H) 65 - 99 mg/dL   BUN 8 6 - 20 mg/dL   Creatinine, Ser 0.63 0.61 - 1.24 mg/dL   Calcium 8.6 (L) 8.9 - 10.3 mg/dL   GFR calc non Af Amer >60 >60 mL/min   GFR calc Af Amer >60 >60 mL/min    Comment: (NOTE) The eGFR has been calculated using the CKD EPI equation. This calculation has not been validated in all clinical situations. eGFR's persistently <60 mL/min signify possible Chronic Kidney Disease.    Anion gap 10 5 - 15    Comment: Performed at [facility] [address] [place], [place] [address]  CBC     Status: Abnormal   Collection Time: 12/29/17  7:42 AM   Result Value Ref Range   WBC 10.8 (H) 4.0 - 10.5 K/uL   RBC 3.69 (L) 4.22 - 5.81 MIL/uL   Hemoglobin 9.5 (L) 13.0 - 17.0 g/dL   HCT 28.6 (L) 39.0 - 52.0 %   MCV 77.5 (L) 78.0 - 100.0 fL   MCH 25.7 (L) 26.0 - 34.0 pg   MCHC 33.2 30.0 - 36.0 g/dL   RDW 12.5 11.5 - 15.5 %   Platelets 619 (H) 150 - 400 K/uL    Comment: Performed at [facility], [place] [address], [place], [place] [address]  Strep pneumoniae urinary antigen     Status: None   Collection Time: 12/29/17 10:16 AM  Result Value Ref Range   Strep Pneumo Urinary Antigen NEGATIVE NEGATIVE    Comment:        Infection due to S. pneumoniae cannot be absolutely ruled out since the antigen present may be below the detection limit of the test. Performed at [facility], [address], [place], [place] [address]     MICRO: afb pending IMAGING: Dg Chest 2 View  Result Date: 12/28/2017 CLINICAL DATA:  Chest pain and shortness of breath for 1 week EXAM: CHEST - 2 VIEW COMPARISON:  12/19/2017 FINDINGS: Cardiac shadow is within normal limits. Nodular densities are again identified throughout the lungs bilaterally. Small right-sided pleural effusion is noted new from the prior study. Some patchy changes are noted in the upper lobes bilaterally which may represent some early infiltrate. No pneumothorax is identified. IMPRESSION: Stable nodular densities bilaterally.  No pneumothorax is noted. New small right-sided pleural effusion. Patchy changes in the upper lobes bilaterally increased from the previous exam which may represent some early infiltrate. Electronically Signed   By: Inez Catalina M.D.   On: 12/28/2017 15:45    HISTORICAL MICRO/IMAGING  Assessment/Plan:  NTM pulmonary infection, possibly a rapid grower since + cx at day 10 vs MAC  -can d/c airborne precautions since unlikely to be mTB but awaiting for NTM work up  - due to being significantly symptomatic, I would choose to start NTM empiric treatment, covering  for m.abscessus until more information returns.   - recommend to start azithromycin 537m IV, cefoxitin 2gmIV Q4hr and Iv tigecycline. Would premedicate for the tigecycline for anti-nausea - recommend to keep hospitalized to see how he tolerates IV abtx, may decide to change regimen pending how he tolerates it and when we get  Back more information  - recommend to do mucinex BID plus accapella valve to help with pulmonary hygiene  Dr VTommy Medalto see in the morning

## 2017-12-30 ENCOUNTER — Inpatient Hospital Stay (HOSPITAL_COMMUNITY)
Admission: RE | Admit: 2017-12-30 | Discharge: 2017-12-30 | Disposition: A | Discharge status: Home / Self Care | Payer: Medicare Other | Source: Ambulatory Visit

## 2017-12-30 DIAGNOSIS — A319 Mycobacterial infection, unspecified: Secondary | ICD-10-CM

## 2017-12-30 DIAGNOSIS — R918 Other nonspecific abnormal finding of lung field: Secondary | ICD-10-CM

## 2017-12-30 DIAGNOSIS — R110 Nausea: Secondary | ICD-10-CM

## 2017-12-30 LAB — CBC
HEMATOCRIT: 28.9 % — AB (ref 39.0–52.0)
HEMOGLOBIN: 9.9 g/dL — AB (ref 13.0–17.0)
MCH: 26.4 pg (ref 26.0–34.0)
MCHC: 34.3 g/dL (ref 30.0–36.0)
MCV: 77.1 fL — AB (ref 78.0–100.0)
PLATELETS: 665 10*3/uL — AB (ref 150–400)
RBC: 3.75 MIL/uL — AB (ref 4.22–5.81)
RDW: 13.1 % (ref 11.5–15.5)
WBC: 11.1 10*3/uL — AB (ref 4.0–10.5)

## 2017-12-30 MED ORDER — ONDANSETRON HCL 4 MG/2ML IJ SOLN
4.0000 mg | INTRAMUSCULAR | Status: DC
  Administered 2017-12-31: 4 mg via INTRAVENOUS

## 2017-12-30 MED ORDER — TRAMADOL HCL 50 MG PO TABS
50.0000 mg | ORAL_TABLET | Freq: Once | ORAL | Status: AC
  Administered 2017-12-30: 50 mg via ORAL
  Filled 2017-12-30: qty 1

## 2017-12-30 NOTE — Progress Notes (Signed)
Patient ID: [name], male   DOB: October 09, 1950, 67 y.o.   MRN: [id number]  PROGRESS NOTE    [name]  AJO:[id number] DOB: Aug 06, 1951 DOA: 12/28/2017  PCP: Bernerd Limbo, MD   Brief Narrative:  67 year old male with past medical history of COPD, prostate cancer, smoker who presented to [facility] with worsening shortness of breath and cough. Chest x-ray showed developing infiltrates in the upper and lower lung zones, patient was started on antibiotics and referred for admission.   Assessment & Plan:   Principal Problem:   Lobar pneumonia ([place]) / Leukocytosis / NTM pulmonary infection - CXR showed stable nodular densities bilaterally.  No pneumothorax noted. New small right-sided pleural effusion. Patchy changes in the upper lobes bilaterally increased from the previous exam which may represent some early infiltrate.  - Was started on vanco and cefepime, stopped vanco 4/21, stopped cefepime 4/21 - Per ID, awaiting NTM work up  - ID recommended to start azithromycin 500 mg IV, cefoxitin 2 gm IV every 4 hours and tigecycline   Active Problems:   Lung nodules  - CT guided core lung biopsy of right nodule on 4/11 - inflammation and fibrosis with numerous plasma cells    Hyponatremia - Dehydration versus acute lung process - Monitor daily BMP    Anemia of chronic disease - Hgb stable     Moderate protein calorie malnutrition / Failure to thrive in adult  - In the context of chronic illness - Diet as tolerated    DVT prophylaxis: SCD's Code Status: full code  Family Communication: fiance at bedside  Disposition Plan: home once cleared by ID   Consultants:   ID  Procedures:   None  Antimicrobials:   Vanco 4/20 --> 4/21  Cefepime 4/20 --> 4/21  Azithro, cefoxitin, tigecycline 4/21 -->   Subjective: No overnight events.  Objective: Vitals:   12/29/17 1145 12/29/17 1750 12/29/17 2041 12/30/17 0619  BP: (!) 146/97 (!) 150/94 (!) 141/86 (!) 155/94  Pulse: 95 (!)  103 (!) 120 (!) 101  Resp:   (!) 25 (!) 21  Temp: 98.3 F (36.8 C) 99 F (37.2 C) 99.1 F (37.3 C) (!) 97.5 F (36.4 C)  TempSrc: Oral Oral Oral Oral  SpO2: 98% 97% 94% 97%  Weight:    46.1 kg (101 lb 11.2 oz)  Height:        Intake/Output Summary (Last 24 hours) at 12/30/2017 0936 Last data filed at 12/30/2017 0700 Gross per 24 hour  Intake 2751.08 ml  Output 1100 ml  Net 1651.08 ml   Filed Weights   12/28/17 2047 12/29/17 0515 12/30/17 0619  Weight: 49.2 kg (108 lb 6.4 oz) 48.2 kg (106 lb 3.2 oz) 46.1 kg (101 lb 11.2 oz)    Physical Exam  Constitutional: Appears well-developed and well-nourished. No distress.  CVS: RRR, S1/S2 + Pulmonary: diminished but no wheezing  Abdominal: Soft. BS +,  no distension, tenderness, rebound or guarding.  Musculoskeletal: Normal range of motion. No edema and no tenderness.  Lymphadenopathy: No lymphadenopathy noted, cervical, inguinal. Neuro: Alert. Normal reflexes, muscle tone coordination. No cranial nerve deficit. Skin: Skin is warm and dry. Psychiatric: Normal mood and affect. Behavior, judgment, thought content normal.     Data Reviewed: I have personally reviewed following labs and imaging studies  CBC: Recent Labs  Lab 12/28/17 1440 12/29/17 0742 12/30/17 0527  WBC 9.5 10.8* 11.1*  HGB 10.6* 9.5* 9.9*  HCT 30.5* 28.6* 28.9*  MCV 76.3* 77.5* 77.1*  PLT 454* 619* 665*  Basic Metabolic Panel: Recent Labs  Lab 12/28/17 1440 12/28/17 2020 12/28/17 2254 12/29/17 0742  NA 123* 124* 123* 127*  K 4.5 4.6 4.4 4.3  CL 89* 91* 90* 93*  CO2 23 22 21* 24  GLUCOSE 103* 112* 126* 101*  BUN 10 9 9 8   CREATININE 0.66 0.64 0.65 0.63  CALCIUM 8.6* 8.5* 8.5* 8.6*   GFR: Estimated Creatinine Clearance: 58.4 mL/min (by C-G formula based on SCr of 0.63 mg/dL). Liver Function Tests: No results for input(s): AST, ALT, ALKPHOS, BILITOT, PROT, ALBUMIN in the last 168 hours. No results for input(s): LIPASE, AMYLASE in the last 168  hours. No results for input(s): AMMONIA in the last 168 hours. Coagulation Profile: No results for input(s): INR, PROTIME in the last 168 hours. Cardiac Enzymes: No results for input(s): CKTOTAL, CKMB, CKMBINDEX, TROPONINI in the last 168 hours. BNP (last 3 results) No results for input(s): PROBNP in the last 8760 hours. HbA1C: No results for input(s): HGBA1C in the last 72 hours. CBG: No results for input(s): GLUCAP in the last 168 hours. Lipid Profile: No results for input(s): CHOL, HDL, LDLCALC, TRIG, CHOLHDL, LDLDIRECT in the last 72 hours. Thyroid Function Tests: No results for input(s): TSH, T4TOTAL, FREET4, T3FREE, THYROIDAB in the last 72 hours. Anemia Panel: No results for input(s): VITAMINB12, FOLATE, FERRITIN, TIBC, IRON, RETICCTPCT in the last 72 hours. Urine analysis: No results found for: COLORURINE, APPEARANCEUR, LABSPEC, PHURINE, GLUCOSEU, HGBUR, BILIRUBINUR, KETONESUR, PROTEINUR, UROBILINOGEN, NITRITE, LEUKOCYTESUR Sepsis Labs: @LABRCNTIP (procalcitonin:4,lacticidven:4)   Acid Fast Smear (AFB)     Status: None   Collection Time: 12/19/17  5:29 PM  Result Value Ref Range Status   AFB Specimen Processing Concentration  Final   Acid Fast Smear Negative  Final    Comment: (NOTE) Performed At: [facility] [place], [place] [id number] Rush Farmer MD [id number]    Source (AFB) TISSUE  Final    Comment: RIGHT UPPER LUNG   Culture, fungus without smear     Status: None (Preliminary result)   Collection Time: 12/19/17  5:29 PM  Result Value Ref Range Status   Specimen Description TISSUE RIGHT UPPER LUNG  Final   Special Requests NONE  Final   Culture   Final    NO FUNGUS ISOLATED AFTER 7 DAYS Performed at [facility], [address], [place], [place] [address]    Report Status PENDING  Incomplete  Aerobic/Anaerobic Culture (surgical/deep wound)     Status: None   Collection Time: 12/19/17  5:29 PM  Result Value Ref Range  Status   Specimen Description TISSUE RIGHT UPPER LUNG  Final   Special Requests NONE  Final   Gram Stain   Final    FEW WBC PRESENT,BOTH PMN AND MONONUCLEAR NO ORGANISMS SEEN    Culture   Final    No growth aerobically or anaerobically. Performed at [facility], [place] [address], [place], [place] [address]    Report Status 12/24/2017 FINAL  Final  MRSA PCR Screening     Status: None   Collection Time: 12/28/17 10:46 PM  Result Value Ref Range Status   MRSA by PCR NEGATIVE NEGATIVE Final      Radiology Studies: Dg Chest 2 View Result Date: 12/28/2017 Stable nodular densities bilaterally.  No pneumothorax is noted. New small right-sided pleural effusion. Patchy changes in the upper lobes bilaterally increased from the previous exam which may represent some early infiltrate.     Scheduled Meds: . aspirin  81  mg Oral Daily  . enoxaparin   40 mg Subcutaneous Q24H   Continuous Infusions: . sodium chloride 50 mL/hr (12/29/17 2029)  . azithromycin Stopped (12/29/17 1738)  . cefOXitin Stopped (12/30/17 0727)  . ondansetron [facility]) IV 8 mg (12/30/17 0534)  . tigecycline (TYGACIL) IVPB 50 mg (12/30/17 0554)     LOS: 2 days    Time spent: 25 minutes  Greater than 50% of the time spent on counseling and coordinating the care.   Leisa Lenz, MD [facility] Pager [phone]  If 7PM-7AM, please contact night-coverage [email or web address] Password [facility] 12/30/2017, 9:36 AM

## 2017-12-30 NOTE — Progress Notes (Signed)
Subjective:  Patient was seen to gospel music when I came into the room and seemed fairly comfortable in his chair.  He did complain of nausea last night   Antibiotics:  Anti-infectives (From admission, onward)   Start     Dose/Rate Route Frequency Ordered Stop   12/30/17 0600  tigecycline (TYGACIL) 50 mg in sodium chloride 0.9 % 100 mL IVPB     50 mg 200 mL/hr over 30 Minutes Intravenous Every 12 hours 12/29/17 1747     12/30/17 0200  tigecycline (TYGACIL) 50 mg in sodium chloride 0.9 % 100 mL IVPB  Status:  Discontinued     50 mg 200 mL/hr over 30 Minutes Intravenous Every 12 hours 12/29/17 1345 12/29/17 1747   12/29/17 1430  cefOXitin (MEFOXIN) 2 g in sodium chloride 0.9 % 100 mL IVPB     2 g 200 mL/hr over 30 Minutes Intravenous Every 6 hours 12/29/17 1345     12/29/17 1400  azithromycin (ZITHROMAX) 500 mg in sodium chloride 0.9 % 250 mL IVPB     500 mg 250 mL/hr over 60 Minutes Intravenous Every 24 hours 12/29/17 1345     12/29/17 1345  tigecycline (TYGACIL) 100 mg in sodium chloride 0.9 % 100 mL IVPB    Note to Pharmacy:  Please schedule zofran 8mg  iv as anti-emetic prior to infusion   100 mg 200 mL/hr over 30 Minutes Intravenous  Once 12/29/17 1345 12/29/17 2138   12/29/17 0600  vancomycin (VANCOCIN) 500 mg in sodium chloride 0.9 % 100 mL IVPB  Status:  Discontinued     500 mg 100 mL/hr over 60 Minutes Intravenous Every 12 hours 12/28/17 1617 12/29/17 0858   12/28/17 2200  ceFEPIme (MAXIPIME) 1 g in sodium chloride 0.9 % 100 mL IVPB  Status:  Discontinued     1 g 200 mL/hr over 30 Minutes Intravenous Every 8 hours 12/28/17 2123 12/29/17 1342   12/28/17 1645  ceFEPIme (MAXIPIME) 2 g in sodium chloride 0.9 % 100 mL IVPB     2 g 200 mL/hr over 30 Minutes Intravenous  Once 12/28/17 1645 12/28/17 1757   12/28/17 1630  ceFEPIme (MAXIPIME) 1 g in sodium chloride 0.9 % 100 mL IVPB  Status:  Discontinued     1 g 200 mL/hr over 30 Minutes Intravenous  Once 12/28/17 1612  12/28/17 1645   12/28/17 1630  vancomycin (VANCOCIN) IVPB 1000 mg/200 mL premix     1,000 mg 200 mL/hr over 60 Minutes Intravenous  Once 12/28/17 1617 12/28/17 1910      Medications: Scheduled Meds: . aspirin  81 mg Oral Daily  . guaiFENesin  600 mg Oral BID  . sodium chloride flush  3 mL Intravenous Q12H   Continuous Infusions: . sodium chloride 50 mL/hr (12/29/17 2029)  . azithromycin Stopped (12/29/17 1738)  . cefOXitin Stopped (12/30/17 1224)  . ondansetron [facility]) IV 8 mg (12/30/17 0534)  . tigecycline (TYGACIL) IVPB 50 mg (12/30/17 0554)   PRN Meds:.acetaminophen **OR** acetaminophen, guaiFENesin-dextromethorphan, ondansetron **OR** ondansetron (ZOFRAN) IV    Objective: Weight change: -8 lb 4.8 oz (-3.765 kg)  Intake/Output Summary (Last 24 hours) at 12/30/2017 1301 Last data filed at 12/30/2017 1100 Gross per 24 hour  Intake 3231.08 ml  Output 1275 ml  Net 1956.08 ml   Blood pressure (!) 155/94, pulse (!) 101, temperature (!) 97.5 F (36.4 C), temperature source Oral, resp. rate (!) 21, height 5\' 4"  (1.626 m), weight 101 lb 11.2 oz (46.1  kg), SpO2 97 %. Temp:  [97.5 F (36.4 C)-99.1 F (37.3 C)] 97.5 F (36.4 C) (04/22 0619) Pulse Rate:  [101-120] 101 (04/22 0619) Resp:  [21-25] 21 (04/22 0619) BP: (141-155)/(86-94) 155/94 (04/22 0619) SpO2:  [94 %-97 %] 97 % (04/22 0619) Weight:  [101 lb 11.2 oz (46.1 kg)] 101 lb 11.2 oz (46.1 kg) (04/22 3500)  Physical Exam: General: Alert and awake, oriented x3, not in any acute distress. HEENT: anicteric sclera, CVS regular rate, normal r,  no murmur rubs or gallops Chest: clear to auscultation bilaterally, no wheezing, rales or rhonchi Abdomen: soft nondistended, normal bowel sounds, Extremities: no edema noted bilaterally Skin: no rashes Neuro: nonfocal  CBC: CBC Latest Ref Rng & Units 12/30/2017 12/29/2017 12/28/2017  WBC 4.0 - 10.5 K/uL 11.1(H) 10.8(H) 9.5  Hemoglobin 13.0 - 17.0 g/dL 9.9(L) 9.5(L) 10.6(L)    Hematocrit 39.0 - 52.0 % 28.9(L) 28.6(L) 30.5(L)  Platelets 150 - 400 K/uL 665(H) 619(H) 454(H)      BMET Recent Labs    12/28/17 2254 12/29/17 0742  NA 123* 127*  K 4.4 4.3  CL 90* 93*  CO2 21* 24  GLUCOSE 126* 101*  BUN 9 8  CREATININE 0.65 0.63  CALCIUM 8.5* 8.6*     Liver Panel  No results for input(s): PROT, ALBUMIN, AST, ALT, ALKPHOS, BILITOT, BILIDIR, IBILI in the last 72 hours.     Sedimentation Rate No results for input(s): ESRSEDRATE in the last 72 hours. C-Reactive Protein No results for input(s): CRP in the last 72 hours.  Micro Results: Recent Results (from the past 720 hour(s))  Acid Fast Smear (AFB)     Status: None   Collection Time: 12/08/17  5:08 PM  Result Value Ref Range Status   AFB Specimen Processing Concentration  Final   Acid Fast Smear Negative  Final    Comment: (NOTE) Performed At: [facility] [place], [place] [id number] Rush Farmer MD [id number]    Source (AFB) EXPECTORATED SPUTUM  Final    Comment: Performed at [facility], [place] [address], [place], [place] [address]  Fungus Culture With Stain     Status: None   Collection Time: 12/08/17  5:08 PM  Result Value Ref Range Status   Fungus Stain Final report  Final   Fungus (Mycology) Culture Preliminary report  Final    Comment: (NOTE) Performed At: [facility] [place], [place] [id number] Rush Farmer MD [id number]    Fungal Source EXPECTORATED SPUTUM  Final    Comment: Performed at [facility], [place] [address] [place], [place] [address]  Fungus Culture Result     Status: None   Collection Time: 12/08/17  5:08 PM  Result Value Ref Range Status   Result 1 Comment  Final    Comment: (NOTE) Pseudohyphae and yeasts observed Performed At: [facility] [place], [place] [id number] Rush Farmer MD [id number]:[id number] Performed at [facility], [place] [address] [place],  [place] [address]   Fungal organism reflex     Status: None   Collection Time: 12/08/17  5:08 PM  Result Value Ref Range Status   Fungal result 1 Candida albicans  Final    Comment: (NOTE) Scant growth Performed At: [facility] [place], [place] [id number] Rush Farmer MD [id number] Performed at [facility], [place] [address] [place], [place] [address]   Blood culture (routine x 2)     Status: None  Collection Time: 12/17/17  1:00 AM  Result Value Ref Range Status   Specimen Description BLOOD LEFT ANTECUBITAL  Final   Special Requests   Final    BOTTLES DRAWN AEROBIC AND ANAEROBIC Blood Culture adequate volume   Culture   Final    NO GROWTH 5 DAYS Performed at [facility], [address], [place], [place] [address]    Report Status 12/22/2017 FINAL  Final  Blood culture (routine x 2)     Status: None   Collection Time: 12/17/17  1:10 AM  Result Value Ref Range Status   Specimen Description BLOOD RIGHT WRIST  Final   Special Requests   Final    BOTTLES DRAWN AEROBIC AND ANAEROBIC Blood Culture results may not be optimal due to an inadequate volume of blood received in culture bottles   Culture   Final    NO GROWTH 5 DAYS Performed at [facility], [place] [address] [place], [place] [address]    Report Status 12/22/2017 FINAL  Final  Aspergillus Ag, BAL/Serum     Status: None   Collection Time: 12/17/17  3:50 AM  Result Value Ref Range Status   Aspergillus Ag, BAL/Serum 0.05 0.00 - 0.49 Index Final    Comment: (NOTE) Performed At: [facility] [address] [place], [place] [id number] Rush Farmer MD [id number] Performed At: [place], [place] [id number] Nechama Guard MD [id number] Performed at [facility], [place] [address] [place], [place] [address]   Culture, sputum-assessment     Status: None   Collection Time: 12/17/17  8:10 AM  Result Value Ref Range Status   Specimen  Description SPUTUM  Final   Special Requests NONE  Final   Sputum evaluation   Final    THIS SPECIMEN IS ACCEPTABLE FOR SPUTUM CULTURE Performed at [facility], [address], [place], [place] [address]    Report Status 12/17/2017 FINAL  Final  Culture, respiratory (NON-Expectorated)     Status: None   Collection Time: 12/17/17  8:10 AM  Result Value Ref Range Status   Specimen Description SPUTUM  Final   Special Requests NONE Reflexed from [id number]  Final   Gram Stain   Final    FEW WBC PRESENT, PREDOMINANTLY PMN FEW GRAM POSITIVE COCCI IN PAIRS RARE GRAM NEGATIVE RODS RARE YEAST RARE GRAM NEGATIVE COCCOBACILLI    Culture   Final    Consistent with normal respiratory flora. Performed at [facility], [place] [address], [place], [place] [address]    Report Status 12/19/2017 FINAL  Final  Acid Fast Smear (AFB)     Status: None   Collection Time: 12/17/17  8:12 AM  Result Value Ref Range Status   AFB Specimen Processing Concentration  Final   Acid Fast Smear Negative  Final    Comment: (NOTE) Performed At: [facility] [place], [place] [id number] Rush Farmer MD [id number]    Source (AFB) SPUTUM  Final    Comment: Performed at [facility], [place] [address] [place], [place] [address]  Acid Fast Culture with reflexed sensitivities     Status: None   Collection Time: 12/17/17  8:12 AM  Result Value Ref Range Status   Acid Fast Culture Positive  Final    Comment: CRITICAL RESULT CALLED TO, READ BACK BY AND VERIFIED WITH: C SNIDER,MD AT 0800 12/28/17 BY L BENFIELD (NOTE) Acid-fast bacilli have been detected in culture  at 2 weeks; see AFB Organism ID by DNA probe REPORTED TO MELANIE M ON 04.18.19 AT 1510 BY LL FAXED RESULTS TO [phone] Performed At: [facility] [place], [place] [id number] Rush Farmer MD JI:[id number]    Source of Sample SPUTUM  Final    Comment: Performed at [facility],  [place] [address] [place], [place] [address]  AFB Organism ID By DNA Probe     Status: None (Preliminary result)   Collection Time: 12/17/17  8:12 AM  Result Value Ref Range Status   M tuberculosis complex Negative  Final    Comment: (NOTE) Performed At: [facility] [place], [place] [id number] Rush Farmer MD [id number] Performed at [facility], [place] [address] [place], [place] [address]    M avium complex PENDING  Incomplete   M kansasii PENDING  Incomplete   M gordonae PENDING  Incomplete   Other: PENDING  Incomplete  Acid Fast Smear (AFB)     Status: None   Collection Time: 12/17/17  3:13 PM  Result Value Ref Range Status   AFB Specimen Processing Concentration  Final   Acid Fast Smear Negative  Final    Comment: (NOTE) Performed At: [facility] [place], [place] [id number] Rush Farmer MD [id number]    Source (AFB) SPUTUM  Final  Acid Fast Smear (AFB)     Status: None   Collection Time: 12/19/17  5:29 PM  Result Value Ref Range Status   AFB Specimen Processing Concentration  Final   Acid Fast Smear Negative  Final    Comment: (NOTE) Performed At: [facility] [place], [place] [id number] Rush Farmer MD [id number]    Source (AFB) TISSUE  Final    Comment: RIGHT UPPER LUNG   Culture, fungus without smear     Status: None (Preliminary result)   Collection Time: 12/19/17  5:29 PM  Result Value Ref Range Status   Specimen Description TISSUE RIGHT UPPER LUNG  Final   Special Requests NONE  Final   Culture   Final    NO FUNGUS ISOLATED AFTER 7 DAYS Performed at [facility], [place] [address], [place], [place] [address]    Report Status PENDING  Incomplete  Aerobic/Anaerobic Culture (surgical/deep wound)     Status: None   Collection Time: 12/19/17  5:29 PM  Result Value Ref Range Status   Specimen Description TISSUE RIGHT UPPER LUNG  Final   Special Requests NONE  Final    Gram Stain   Final    FEW WBC PRESENT,BOTH PMN AND MONONUCLEAR NO ORGANISMS SEEN    Culture   Final    No growth aerobically or anaerobically. Performed at [facility], [place] [address] [place], [place] [address]    Report Status 12/24/2017 FINAL  Final  MRSA PCR Screening     Status: None   Collection Time: 12/28/17 10:46 PM  Result Value Ref Range Status   MRSA by PCR NEGATIVE NEGATIVE Final    Comment:        The GeneXpert MRSA Assay (FDA approved for NASAL specimens only), is one component of a comprehensive MRSA colonization surveillance program. It is not intended to diagnose MRSA infection nor to guide or monitor treatment for MRSA infections. Performed at [facility], [place] [address] [place], [place] [address]   Culture, blood (routine x 2) Call MD if unable to obtain prior to antibiotics being given  Status: None (Preliminary result)   Collection Time: 12/28/17 10:54 PM  Result Value Ref Range Status   Specimen Description BLOOD LEFT ARM  Final   Special Requests   Final    BOTTLES DRAWN AEROBIC AND ANAEROBIC Blood Culture results may not be optimal due to an inadequate volume of blood received in culture bottles   Culture   Final    NO GROWTH < 24 HOURS Performed at [place] [address], [place], [place] [address]    Report Status PENDING  Incomplete  Culture, blood (routine x 2) Call MD if unable to obtain prior to antibiotics being given     Status: None (Preliminary result)   Collection Time: 12/28/17 10:54 PM  Result Value Ref Range Status   Specimen Description BLOOD RIGHT HAND  Final   Special Requests   Final    BOTTLES DRAWN AEROBIC AND ANAEROBIC Blood Culture adequate volume   Culture   Final    NO GROWTH < 24 HOURS Performed at [facility], [address] [place], [place] [address]    Report Status PENDING  Incomplete    Studies/Results: Dg Chest 2 View  Result Date: 12/28/2017 CLINICAL DATA:  Chest pain and  shortness of breath for 1 week EXAM: CHEST - 2 VIEW COMPARISON:  12/19/2017 FINDINGS: Cardiac shadow is within normal limits. Nodular densities are again identified throughout the lungs bilaterally. Small right-sided pleural effusion is noted new from the prior study. Some patchy changes are noted in the upper lobes bilaterally which may represent some early infiltrate. No pneumothorax is identified. IMPRESSION: Stable nodular densities bilaterally.  No pneumothorax is noted. New small right-sided pleural effusion. Patchy changes in the upper lobes bilaterally increased from the previous exam which may represent some early infiltrate. Electronically Signed   By: Inez Catalina M.D.   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  Dr. Baxter Flattery is initiated empiric therapy for a rapid grower with tigecycline cefoxitin and azithromycin.  It will be imperative to send any organism that grows for susceptibility testing to help guide protracted therapy.  He may need improvement in his antinausea medicine and certainly needs to be premedicated prior to getting his tigecycline.  LOS: 2 days   Alcide Evener 12/30/2017, 1:01 PM

## 2017-12-31 ENCOUNTER — Ambulatory Visit: Payer: Medicare Other | Admitting: Family

## 2017-12-31 DIAGNOSIS — J90 Pleural effusion, not elsewhere classified: Secondary | ICD-10-CM

## 2017-12-31 DIAGNOSIS — R911 Solitary pulmonary nodule: Secondary | ICD-10-CM

## 2017-12-31 LAB — CBC
HCT: 27.8 % — ABNORMAL LOW (ref 39.0–52.0)
HEMOGLOBIN: 9.3 g/dL — AB (ref 13.0–17.0)
MCH: 25.9 pg — ABNORMAL LOW (ref 26.0–34.0)
MCHC: 33.5 g/dL (ref 30.0–36.0)
MCV: 77.4 fL — AB (ref 78.0–100.0)
Platelets: 611 10*3/uL — ABNORMAL HIGH (ref 150–400)
RBC: 3.59 MIL/uL — AB (ref 4.22–5.81)
RDW: 12.5 % (ref 11.5–15.5)
WBC: 12.9 10*3/uL — AB (ref 4.0–10.5)

## 2017-12-31 LAB — BASIC METABOLIC PANEL
ANION GAP: 7 (ref 5–15)
BUN: 16 mg/dL (ref 6–20)
CHLORIDE: 99 mmol/L — AB (ref 101–111)
CO2: 21 mmol/L — ABNORMAL LOW (ref 22–32)
Calcium: 8.2 mg/dL — ABNORMAL LOW (ref 8.9–10.3)
Creatinine, Ser: 0.76 mg/dL (ref 0.61–1.24)
GFR calc Af Amer: >60 mL/min (ref >60)
Glucose, Bld: 91 mg/dL (ref 65–99)
POTASSIUM: 4.6 mmol/L (ref 3.5–5.1)
SODIUM: 127 mmol/L — AB (ref 135–145)

## 2017-12-31 MED ORDER — ONDANSETRON HCL 4 MG/2ML IJ SOLN
4.0000 mg | Freq: Two times a day (BID) | INTRAMUSCULAR | Status: DC
  Administered 2017-12-31 – 2018-01-08 (×15): 4 mg via INTRAVENOUS
  Filled 2017-12-31 (×16): qty 2

## 2017-12-31 NOTE — Care Management Note (Signed)
Case Management Note  Patient Details  Name: [name] MRN: [id number] Date of Birth: 1951-07-27  Subjective/Objective:                 Spoke w patient a t bedside. Readmission after being home for a week. Patient being treating for severe PNA. Patient from home alone, has a daughter in [place] 30 minutes away. Denies having any DME at home, no O2, no nebulizer. Will benefit from [facility] likely at DC. Patient is open to any [place]. CM will continue to follow.    Action/Plan:   Expected Discharge Date:                  Expected Discharge Plan:  [place]  In-House Referral:     Discharge planning Services  CM Consult  Post Acute Care Choice:    Choice offered to:     DME Arranged:    DME Agency:     HH Arranged:    [place] Agency:     Status of Service:  In process, will continue to follow  If discussed at Long Length of Stay Meetings, dates discussed:    Additional Comments:  Carles Collet, RN 12/31/2017, 10:01 AM

## 2017-12-31 NOTE — Progress Notes (Signed)
Subjective:  No new complaints   Antibiotics:  Anti-infectives (From admission, onward)   Start     Dose/Rate Route Frequency Ordered Stop   12/30/17 0600  tigecycline (TYGACIL) 50 mg in sodium chloride 0.9 % 100 mL IVPB     50 mg 200 mL/hr over 30 Minutes Intravenous Every 12 hours 12/29/17 1747     12/30/17 0200  tigecycline (TYGACIL) 50 mg in sodium chloride 0.9 % 100 mL IVPB  Status:  Discontinued     50 mg 200 mL/hr over 30 Minutes Intravenous Every 12 hours 12/29/17 1345 12/29/17 1747   12/29/17 1430  cefOXitin (MEFOXIN) 2 g in sodium chloride 0.9 % 100 mL IVPB     2 g 200 mL/hr over 30 Minutes Intravenous Every 6 hours 12/29/17 1345     12/29/17 1400  azithromycin (ZITHROMAX) 500 mg in sodium chloride 0.9 % 250 mL IVPB     500 mg 250 mL/hr over 60 Minutes Intravenous Every 24 hours 12/29/17 1345     12/29/17 1345  tigecycline (TYGACIL) 100 mg in sodium chloride 0.9 % 100 mL IVPB    Note to Pharmacy:  Please schedule zofran 8mg  iv as anti-emetic prior to infusion   100 mg 200 mL/hr over 30 Minutes Intravenous  Once 12/29/17 1345 12/29/17 2138   12/29/17 0600  vancomycin (VANCOCIN) 500 mg in sodium chloride 0.9 % 100 mL IVPB  Status:  Discontinued     500 mg 100 mL/hr over 60 Minutes Intravenous Every 12 hours 12/28/17 1617 12/29/17 0858   12/28/17 2200  ceFEPIme (MAXIPIME) 1 g in sodium chloride 0.9 % 100 mL IVPB  Status:  Discontinued     1 g 200 mL/hr over 30 Minutes Intravenous Every 8 hours 12/28/17 2123 12/29/17 1342   12/28/17 1645  ceFEPIme (MAXIPIME) 2 g in sodium chloride 0.9 % 100 mL IVPB     2 g 200 mL/hr over 30 Minutes Intravenous  Once 12/28/17 1645 12/28/17 1757   12/28/17 1630  ceFEPIme (MAXIPIME) 1 g in sodium chloride 0.9 % 100 mL IVPB  Status:  Discontinued     1 g 200 mL/hr over 30 Minutes Intravenous  Once 12/28/17 1612 12/28/17 1645   12/28/17 1630  vancomycin (VANCOCIN) IVPB 1000 mg/200 mL premix     1,000 mg 200 mL/hr over 60 Minutes  Intravenous  Once 12/28/17 1617 12/28/17 1910      Medications: Scheduled Meds: . aspirin  81 mg Oral Daily  . guaiFENesin  600 mg Oral BID  . ondansetron (ZOFRAN) IV  4 mg Intravenous Q12H  . sodium chloride flush  3 mL Intravenous Q12H   Continuous Infusions: . sodium chloride 30 mL/hr at 12/31/17 1230  . azithromycin Stopped (12/31/17 1442)  . cefOXitin Stopped (12/31/17 1828)  . tigecycline (TYGACIL) IVPB 50 mg (12/31/17 1900)   PRN Meds:.acetaminophen **OR** acetaminophen, guaiFENesin-dextromethorphan, ondansetron **OR** ondansetron (ZOFRAN) IV    Objective: Weight change: 9 lb 9.6 oz (4.355 kg)  Intake/Output Summary (Last 24 hours) at 12/31/2017 2243 Last data filed at 12/31/2017 1800 Gross per 24 hour  Intake 1765 ml  Output 1025 ml  Net 740 ml   Blood pressure (!) 152/92, pulse (!) 113, temperature 98.3 F (36.8 C), temperature source Oral, resp. rate (!) 25, height 5\' 4"  (1.626 m), weight 111 lb 4.8 oz (50.5 kg), SpO2 95 %. Temp:  [98 F (36.7 C)-98.7 F (37.1 C)] 98.3 F (36.8 C) (04/23 2002) Pulse Rate:  [101-113]  113 (04/23 2002) Resp:  [25-26] 25 (04/23 2002) BP: (132-152)/(92-96) 152/92 (04/23 2002) SpO2:  [94 %-99 %] 95 % (04/23 2002) Weight:  [111 lb 4.8 oz (50.5 kg)] 111 lb 4.8 oz (50.5 kg) (04/23 5284)  Physical Exam: General: Alert and awake, oriented x3, not in any acute distress. HEENT: anicteric sclera, CVS regular rate, normal r,  no murmur rubs or gallops Chest: clear to auscultation bilaterally, no wheezing, rales or rhonchi Abdomen: soft nondistended, normal bowel sounds, Extremities: no edema noted bilaterally Skin: no rashes Neuro: nonfocal  CBC: CBC Latest Ref Rng & Units 12/31/2017 12/30/2017 12/29/2017  WBC 4.0 - 10.5 K/uL 12.9(H) 11.1(H) 10.8(H)  Hemoglobin 13.0 - 17.0 g/dL 9.3(L) 9.9(L) 9.5(L)  Hematocrit 39.0 - 52.0 % 27.8(L) 28.9(L) 28.6(L)  Platelets 150 - 400 K/uL 611(H) 665(H) 619(H)      BMET Recent Labs     12/29/17 0742 12/31/17 0424  NA 127* 127*  K 4.3 4.6  CL 93* 99*  CO2 24 21*  GLUCOSE 101* 91  BUN 8 16  CREATININE 0.63 0.76  CALCIUM 8.6* 8.2*     Liver Panel  No results for input(s): PROT, ALBUMIN, AST, ALT, ALKPHOS, BILITOT, BILIDIR, IBILI in the last 72 hours.     Sedimentation Rate No results for input(s): ESRSEDRATE in the last 72 hours. C-Reactive Protein No results for input(s): CRP in the last 72 hours.  Micro Results: Recent Results (from the past 720 hour(s))  Acid Fast Smear (AFB)     Status: None   Collection Time: 12/08/17  5:08 PM  Result Value Ref Range Status   AFB Specimen Processing Concentration  Final   Acid Fast Smear Negative  Final    Comment: (NOTE) Performed At: [facility] [place], [place] [id number] Rush Farmer MD VO:[id number]    Source (AFB) EXPECTORATED SPUTUM  Final    Comment: Performed at [facility], [place] [address], [place], [place] [address]  Fungus Culture With Stain     Status: None   Collection Time: 12/08/17  5:08 PM  Result Value Ref Range Status   Fungus Stain Final report  Final   Fungus (Mycology) Culture Preliminary report  Final    Comment: (NOTE) Performed At: [facility] [place], [place] [id number] Rush Farmer MD IR:[id number]    Fungal Source EXPECTORATED SPUTUM  Final    Comment: Performed at [facility], [place] [address], [place], [place] [address]  Fungus Culture Result     Status: None   Collection Time: 12/08/17  5:08 PM  Result Value Ref Range Status   Result 1 Comment  Final    Comment: (NOTE) Pseudohyphae and yeasts observed Performed At: [facility] [place], [place] [id number] Rush Farmer MD [id number] Performed at [facility], [place] [address], [place], [place] [address]   Fungal organism reflex     Status: None   Collection Time: 12/08/17  5:08 PM  Result Value Ref Range Status   Fungal  result 1 Candida albicans  Final    Comment: (NOTE) Scant growth Performed At: [facility] [place], [place] [id number] Rush Farmer MD [id number] Performed at [facility], [place] [address], [place], [place] [address]   Blood culture (routine x 2)     Status: None   Collection Time: 12/17/17  1:00 AM  Result Value Ref Range Status   Specimen Description BLOOD LEFT ANTECUBITAL  Final  Special Requests   Final    BOTTLES DRAWN AEROBIC AND ANAEROBIC Blood Culture adequate volume   Culture   Final    NO GROWTH 5 DAYS Performed at [facility], [place] [address] [place], [place] [address]    Report Status 12/22/2017 FINAL  Final  Blood culture (routine x 2)     Status: None   Collection Time: 12/17/17  1:10 AM  Result Value Ref Range Status   Specimen Description BLOOD RIGHT WRIST  Final   Special Requests   Final    BOTTLES DRAWN AEROBIC AND ANAEROBIC Blood Culture results may not be optimal due to an inadequate volume of blood received in culture bottles   Culture   Final    NO GROWTH 5 DAYS Performed at [facility], [place] [address], [place], [place] [address]    Report Status 12/22/2017 FINAL  Final  Aspergillus Ag, BAL/Serum     Status: None   Collection Time: 12/17/17  3:50 AM  Result Value Ref Range Status   Aspergillus Ag, BAL/Serum 0.05 0.00 - 0.49 Index Final    Comment: (NOTE) Performed At: [facility] [address] [place], [place] [id number] Rush Farmer MD [id number] Performed At: [place], [place] [id number] Nechama Guard MD BT:[id number] Performed at [facility], [place] [address], [place], [place] [address]   Culture, sputum-assessment     Status: None   Collection Time: 12/17/17  8:10 AM  Result Value Ref Range Status   Specimen Description SPUTUM  Final   Special Requests NONE  Final   Sputum evaluation   Final    THIS SPECIMEN IS ACCEPTABLE FOR SPUTUM  CULTURE Performed at [facility], [address] [place], [place] [address]    Report Status 12/17/2017 FINAL  Final  Culture, respiratory (NON-Expectorated)     Status: None   Collection Time: 12/17/17  8:10 AM  Result Value Ref Range Status   Specimen Description SPUTUM  Final   Special Requests NONE Reflexed from [phone]  Final   Gram Stain   Final    FEW WBC PRESENT, PREDOMINANTLY PMN FEW GRAM POSITIVE COCCI IN PAIRS RARE GRAM NEGATIVE RODS RARE YEAST RARE GRAM NEGATIVE COCCOBACILLI    Culture   Final    Consistent with normal respiratory flora. Performed at [facility], [place] [address] [place], [place] [address]    Report Status 12/19/2017 FINAL  Final  Acid Fast Smear (AFB)     Status: None   Collection Time: 12/17/17  8:12 AM  Result Value Ref Range Status   AFB Specimen Processing Concentration  Final   Acid Fast Smear Negative  Final    Comment: (NOTE) Performed At: [facility] [place], [place] [id number] Rush Farmer MD [id number]    Source (AFB) SPUTUM  Final    Comment: Performed at [facility], [place] [address], [place], [place] [address]  Acid Fast Culture with reflexed sensitivities     Status: None   Collection Time: 12/17/17  8:12 AM  Result Value Ref Range Status   Acid Fast Culture Positive  Final    Comment: CRITICAL RESULT CALLED TO, READ BACK BY AND VERIFIED WITH: C SNIDER,MD AT 0800 12/28/17 BY L BENFIELD (NOTE) Acid-fast bacilli have been detected in culture at 2 weeks; see AFB Organism ID by DNA probe REPORTED TO MELANIE M ON 04.18.19 AT 1510 BY LL FAXED RESULTS TO  [phone] Performed At: [facility] [place], [place] [id number] Rush Farmer MD [id number]    Source of Sample SPUTUM  Final    Comment: Performed at [facility], [place] [address], [place], [place] [address]  AFB Organism ID By DNA Probe     Status: None (Preliminary result)   Collection Time:  12/17/17  8:12 AM  Result Value Ref Range Status   M tuberculosis complex Negative  Final    Comment: (NOTE) Performed At: [facility] [place], [place] [id number] Rush Farmer MD [id number] Performed at [facility], [place] [address], [place], [place] [address]    M avium complex PENDING  Incomplete   M kansasii PENDING  Incomplete   M gordonae PENDING  Incomplete   Other: PENDING  Incomplete  Acid Fast Smear (AFB)     Status: None   Collection Time: 12/17/17  3:13 PM  Result Value Ref Range Status   AFB Specimen Processing Concentration  Final   Acid Fast Smear Negative  Final    Comment: (NOTE) Performed At: [facility] [place], [place] [id number] Rush Farmer MD [id number]    Source (AFB) SPUTUM  Final  Acid Fast Smear (AFB)     Status: None   Collection Time: 12/19/17  5:29 PM  Result Value Ref Range Status   AFB Specimen Processing Concentration  Final   Acid Fast Smear Negative  Final    Comment: (NOTE) Performed At: [facility] [place], [place] [id number] Rush Farmer MD [id number]    Source (AFB) TISSUE  Final    Comment: RIGHT UPPER LUNG   Culture, fungus without smear     Status: None (Preliminary result)   Collection Time: 12/19/17  5:29 PM  Result Value Ref Range Status   Specimen Description TISSUE RIGHT UPPER LUNG  Final   Special Requests NONE  Final   Culture   Final    NO FUNGUS ISOLATED AFTER 7 DAYS Performed at [facility], [place] [address], [place], [place] [address]    Report Status PENDING  Incomplete  Aerobic/Anaerobic Culture (surgical/deep wound)     Status: None   Collection Time: 12/19/17  5:29 PM  Result Value Ref Range Status   Specimen Description TISSUE RIGHT UPPER LUNG  Final   Special Requests NONE  Final   Gram Stain   Final    FEW WBC PRESENT,BOTH PMN AND MONONUCLEAR NO ORGANISMS SEEN    Culture   Final    No growth aerobically  or anaerobically. Performed at [facility], [place] [address] [place], [place] [address]    Report Status 12/24/2017 FINAL  Final  MRSA PCR Screening     Status: None   Collection Time: 12/28/17 10:46 PM  Result Value Ref Range Status   MRSA by PCR NEGATIVE NEGATIVE Final    Comment:        The GeneXpert MRSA Assay (FDA approved for NASAL specimens only), is one component of a comprehensive MRSA colonization surveillance program. It is not intended to diagnose MRSA infection nor to guide or monitor treatment for MRSA infections. Performed at [facility], [place] [address] [place], [place] [address]   Culture, blood (routine x 2) Call MD if unable to obtain prior to antibiotics being given     Status: None (Preliminary result)   Collection Time: 12/28/17 10:54 PM  Result Value Ref Range Status  Specimen Description BLOOD LEFT ARM  Final   Special Requests   Final    BOTTLES DRAWN AEROBIC AND ANAEROBIC Blood Culture results may not be optimal due to an inadequate volume of blood received in culture bottles   Culture   Final    NO GROWTH 3 DAYS Performed at [facility], [place] [address], [place], [place] [address]    Report Status PENDING  Incomplete  Culture, blood (routine x 2) Call MD if unable to obtain prior to antibiotics being given     Status: None (Preliminary result)   Collection Time: 12/28/17 10:54 PM  Result Value Ref Range Status   Specimen Description BLOOD RIGHT HAND  Final   Special Requests   Final    BOTTLES DRAWN AEROBIC AND ANAEROBIC Blood Culture adequate volume   Culture   Final    NO GROWTH 3 DAYS Performed at [facility], [place] [address] [place], [place] [address]    Report Status PENDING  Incomplete    Studies/Results: No results found.    Assessment/Plan:  INTERVAL HISTORY: called [facility] re labs   Principal Problem:   Lobar pneumonia ([place]) Active Problems:   G E R D   Lung nodules   Hyponatremia    Mycobacterial disease    [name] is a 67 y.o. 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  Dr. Baxter Flattery is initiated empiric therapy for a rapid grower with tigecycline cefoxitin and azithromycin.  It will be imperative to send any organism that grows for susceptibility testing to help guide protracted therapy.  I called [facility] today re the ID of NTM but they have not yet called me back.   LOS: 3 days   [name] 12/31/2017, 10:43 PM

## 2017-12-31 NOTE — Progress Notes (Signed)
Pt daughter called concerned that patients home had recently had gas leak and wants to make sure that his breathing issues were not related to the gas leak. Carroll Kinds RN

## 2017-12-31 NOTE — Progress Notes (Signed)
Patient ID: [name], male   DOB: 04-Dec-1950, 67 y.o.   MRN: [id number]  PROGRESS NOTE    [name]  OVF:[id number] DOB: [birth year] DOA: 12/28/2017  PCP: Bernerd Limbo, MD   Brief Narrative:  67 year old male with past medical history of COPD, prostate cancer, smoker who presented to [facility] with worsening shortness of breath and cough. Chest x-ray showed developing infiltrates in the upper and lower lung zones, patient was started on antibiotics and referred for admission.   Assessment & Plan:   Principal Problem:   Lobar pneumonia ([place]) / Leukocytosis / NTM pulmonary infection - CXR showed stable nodular densities bilaterally.  No pneumothorax noted. New small right-sided pleural effusion. Patchy changes in the upper lobes bilaterally increased from the previous exam which may represent some early infiltrate.  - Was started on vanco and cefepime, stopped vanco 4/21, stopped cefepime 4/21 - Continue azithromycin 500 mg IV, cefoxitin 2 gm IV every 4 hours and tigecycline  - Appreciate ID following   Active Problems:   Lung nodules  - CT guided core lung biopsy of right nodule on 4/11 - inflammation and fibrosis with numerous plasma cells    Hyponatremia - Dehydration versus acute lung process - Sodium 127 - Follow up BMP in am    Anemia of chronic disease - Hgb 9.3    Moderate protein calorie malnutrition / Failure to thrive in adult  - In the context of chronic illness - Diet as tolerated     DVT prophylaxis: SCD's Code Status: full code  Family Communication: fiance at bedside  Disposition Plan: home once cleared by ID   Consultants:   ID  Procedures:   None  Antimicrobials:   Vanco 4/20 --> 4/21  Cefepime 4/20 --> 4/21  Azithro, cefoxitin, tigecycline 4/21 -->   Subjective: Some cough overnight.  Objective: Vitals:   12/30/17 0619 12/30/17 1300 12/30/17 1956 12/31/17 0605  BP: (!) 155/94  (!) 158/97 (!) 150/96  Pulse: (!) 101 100 (!) 114 (!)  101  Resp: (!) 21 (!) 21 (!) 27 (!) 26  Temp: (!) 97.5 F (36.4 C) 98 F (36.7 C) 98.3 F (36.8 C) 98 F (36.7 C)  TempSrc: Oral Oral Oral Oral  SpO2: 97%  97% 94%  Weight: 46.1 kg (101 lb 11.2 oz)   50.5 kg (111 lb 4.8 oz)  Height:        Intake/Output Summary (Last 24 hours) at 12/31/2017 1119 Last data filed at 12/31/2017 0800 Gross per 24 hour  Intake 2241.67 ml  Output 1225 ml  Net 1016.67 ml   Filed Weights   12/29/17 0515 12/30/17 0619 12/31/17 0605  Weight: 48.2 kg (106 lb 3.2 oz) 46.1 kg (101 lb 11.2 oz) 50.5 kg (111 lb 4.8 oz)    Physical Exam  Constitutional: Appears well-developed and well-nourished. No distress.   CVS: RRR, S1/S2 + Pulmonary: diminished breath sounds, no wheezing  Abdominal: Soft. BS +,  no distension, tenderness, rebound or guarding.  Musculoskeletal: Normal range of motion. No edema and no tenderness.  Lymphadenopathy: No lymphadenopathy noted, cervical, inguinal. Neuro: Alert. Normal reflexes, muscle tone coordination. No cranial nerve deficit. Skin: Skin is warm and dry. No rash noted. Not diaphoretic. No erythema. No pallor.  Psychiatric: Normal mood and affect. Behavior, judgment, thought content normal.     Data Reviewed: I have personally reviewed following labs and imaging studies  CBC: Recent Labs  Lab 12/28/17 1440 12/29/17 0742 12/30/17 0527 12/31/17 0424  WBC 9.5 10.8* 11.1* 12.9*  HGB 10.6* 9.5* 9.9* 9.3*  HCT 30.5* 28.6* 28.9* 27.8*  MCV 76.3* 77.5* 77.1* 77.4*  PLT 454* 619* 665* 161*   Basic Metabolic Panel: Recent Labs  Lab 12/28/17 1440 12/28/17 2020 12/28/17 2254 12/29/17 0742 12/31/17 0424  NA 123* 124* 123* 127* 127*  K 4.5 4.6 4.4 4.3 4.6  CL 89* 91* 90* 93* 99*  CO2 23 22 21* 24 21*  GLUCOSE 103* 112* 126* 101* 91  BUN 10 9 9 8 16   CREATININE 0.66 0.64 0.65 0.63 0.76  CALCIUM 8.6* 8.5* 8.5* 8.6* 8.2*   GFR: Estimated Creatinine Clearance: 64 mL/min (by C-G formula based on SCr of 0.76  mg/dL). Liver Function Tests: No results for input(s): AST, ALT, ALKPHOS, BILITOT, PROT, ALBUMIN in the last 168 hours. No results for input(s): LIPASE, AMYLASE in the last 168 hours. No results for input(s): AMMONIA in the last 168 hours. Coagulation Profile: No results for input(s): INR, PROTIME in the last 168 hours. Cardiac Enzymes: No results for input(s): CKTOTAL, CKMB, CKMBINDEX, TROPONINI in the last 168 hours. BNP (last 3 results) No results for input(s): PROBNP in the last 8760 hours. HbA1C: No results for input(s): HGBA1C in the last 72 hours. CBG: No results for input(s): GLUCAP in the last 168 hours. Lipid Profile: No results for input(s): CHOL, HDL, LDLCALC, TRIG, CHOLHDL, LDLDIRECT in the last 72 hours. Thyroid Function Tests: No results for input(s): TSH, T4TOTAL, FREET4, T3FREE, THYROIDAB in the last 72 hours. Anemia Panel: No results for input(s): VITAMINB12, FOLATE, FERRITIN, TIBC, IRON, RETICCTPCT in the last 72 hours. Urine analysis: No results found for: COLORURINE, APPEARANCEUR, LABSPEC, PHURINE, GLUCOSEU, HGBUR, BILIRUBINUR, KETONESUR, PROTEINUR, UROBILINOGEN, NITRITE, LEUKOCYTESUR Sepsis Labs: @LABRCNTIP (procalcitonin:4,lacticidven:4)   Acid Fast Smear (AFB)     Status: None   Collection Time: 12/19/17  5:29 PM  Result Value Ref Range Status   AFB Specimen Processing Concentration  Final   Acid Fast Smear Negative  Final    Comment: (NOTE) Performed At: [facility] [place], [place] [id number] Rush Farmer MD [id number]    Source (AFB) TISSUE  Final    Comment: RIGHT UPPER LUNG   Culture, fungus without smear     Status: None (Preliminary result)   Collection Time: 12/19/17  5:29 PM  Result Value Ref Range Status   Specimen Description TISSUE RIGHT UPPER LUNG  Final   Special Requests NONE  Final   Culture   Final    NO FUNGUS ISOLATED AFTER 7 DAYS Performed at [facility], [address], [place],  [place] [address]    Report Status PENDING  Incomplete  Aerobic/Anaerobic Culture (surgical/deep wound)     Status: None   Collection Time: 12/19/17  5:29 PM  Result Value Ref Range Status   Specimen Description TISSUE RIGHT UPPER LUNG  Final   Special Requests NONE  Final   Gram Stain   Final    FEW WBC PRESENT,BOTH PMN AND MONONUCLEAR NO ORGANISMS SEEN    Culture   Final    No growth aerobically or anaerobically. Performed at [facility], [place] [address] [place], [place] [address]    Report Status 12/24/2017 FINAL  Final  MRSA PCR Screening     Status: None   Collection Time: 12/28/17 10:46 PM  Result Value Ref Range Status   MRSA by PCR NEGATIVE NEGATIVE Final      Radiology Studies: Dg Chest 2 View Result Date: 12/28/2017 Stable nodular densities bilaterally.  No pneumothorax  is noted. New small right-sided pleural effusion. Patchy changes in the upper lobes bilaterally increased from the previous exam which may represent some early infiltrate.     Scheduled Meds: . aspirin  81 mg Oral Daily  . enoxaparin   40 mg Subcutaneous Q24H   Continuous Infusions: . sodium chloride 50 mL/hr (12/30/17 9562)  . azithromycin Stopped (12/30/17 1409)  . cefOXitin Stopped (12/31/17 0531)  . tigecycline (TYGACIL) IVPB Stopped (12/31/17 1308)     LOS: 3 days    Time spent: 25 minutes  Greater than 50% of the time spent on counseling and coordinating the care.   Leisa Lenz, MD [facility] Pager [phone]  If 7PM-7AM, please contact night-coverage [email or web address] Password [facility] 12/31/2017, 11:19 AM

## 2018-01-01 ENCOUNTER — Inpatient Hospital Stay (HOSPITAL_COMMUNITY): Payer: Medicare Other

## 2018-01-01 DIAGNOSIS — R0600 Dyspnea, unspecified: Secondary | ICD-10-CM

## 2018-01-01 DIAGNOSIS — A319 Mycobacterial infection, unspecified: Secondary | ICD-10-CM

## 2018-01-01 LAB — BASIC METABOLIC PANEL
Anion gap: 8 (ref 5–15)
BUN: 17 mg/dL (ref 6–20)
CHLORIDE: 96 mmol/L — AB (ref 101–111)
CO2: 24 mmol/L (ref 22–32)
Calcium: 8.5 mg/dL — ABNORMAL LOW (ref 8.9–10.3)
Creatinine, Ser: 0.88 mg/dL (ref 0.61–1.24)
GFR calc Af Amer: >60 mL/min (ref >60)
GLUCOSE: 88 mg/dL (ref 65–99)
POTASSIUM: 4.8 mmol/L (ref 3.5–5.1)
Sodium: 128 mmol/L — ABNORMAL LOW (ref 135–145)

## 2018-01-01 MED ORDER — HYDROCOD POLST-CPM POLST ER 10-8 MG/5ML PO SUER
5.0000 mL | Freq: Two times a day (BID) | ORAL | Status: DC
  Administered 2018-01-01 – 2018-01-09 (×10): 5 mL via ORAL
  Filled 2018-01-01 (×10): qty 5

## 2018-01-01 MED ORDER — ALUM & MAG HYDROXIDE-SIMETH 200-200-20 MG/5ML PO SUSP
30.0000 mL | Freq: Four times a day (QID) | ORAL | Status: DC | PRN
Start: 2018-01-01 — End: 2018-01-15

## 2018-01-01 NOTE — Progress Notes (Signed)
Patient ID: [name], male   DOB: [birth year], 67 y.o.   MRN: [id number]  PROGRESS NOTE    Armstrong Creasy  GYF:[id number] DOB: Apr 06, 1951 DOA: 12/28/2017  PCP: Bernerd Limbo, MD   Brief Narrative:  67 year old male with past medical history of COPD, prostate cancer, smoker who presented to [facility] with worsening shortness of breath and cough. Chest x-ray showed developing infiltrates in the upper and lower lung zones, patient was started on antibiotics and referred for admission.   Assessment & Plan:   Principal Problem:   Lobar pneumonia ([place]) / Leukocytosis / NTM pulmonary infection - CXR showed stable nodular densities bilaterally.  No pneumothorax noted. New small right-sided pleural effusion. Patchy changes in the upper lobes bilaterally increased from the previous exam which may represent some early infiltrate.  - Was started on vanco and cefepime, stopped vanco 4/21, stopped cefepime 4/21 - Continue azithromycin 500 mg IV, cefoxitin 2 gm IV every 4 hours and tigecycline  - More coughing this am, seems that robitussin and mucinex are not helping so will order tussionex - Obtain CXR  Active Problems:   Lung nodules  - CT guided core lung biopsy of right nodule on 4/11 - inflammation and fibrosis with numerous plasma cells    Hyponatremia - Dehydration versus acute lung process - Sodium 127 - Follow up BMP this am    Anemia of chronic disease - Hgb stable     Moderate protein calorie malnutrition / Failure to thrive in adult  - In the context of chronic illness - Diet as tolerated     DVT prophylaxis: SCD's Code Status: full code  Family Communication: fiance at bedside Disposition Plan: home once cleared by ID   Consultants:   ID  Procedures:   None  Antimicrobials:   Vanco 4/20 --> 4/21  Cefepime 4/20 --> 4/21  Azithro, cefoxitin, tigecycline 4/21 -->   Subjective: Coughing this am.  Objective: Vitals:   12/31/17 0605 12/31/17 1436 12/31/17 2002  01/01/18 0629  BP: (!) 150/96 (!) 132/94 (!) 152/92 (!) 157/98  Pulse: (!) 101 (!) 110 (!) 113 (!) 102  Resp: (!) 26  (!) 25 (!) 24  Temp: 98 F (36.7 C) 98.7 F (37.1 C) 98.3 F (36.8 C) 97.6 F (36.4 C)  TempSrc: Oral  Oral Oral  SpO2: 94% 99% 95% 100%  Weight: 50.5 kg (111 lb 4.8 oz)   49.9 kg (110 lb 1.6 oz)  Height:        Intake/Output Summary (Last 24 hours) at 01/01/2018 1159 Last data filed at 01/01/2018 0815 Gross per 24 hour  Intake 1768.33 ml  Output 1200 ml  Net 568.33 ml   Filed Weights   12/30/17 0619 12/31/17 0605 01/01/18 0629  Weight: 46.1 kg (101 lb 11.2 oz) 50.5 kg (111 lb 4.8 oz) 49.9 kg (110 lb 1.6 oz)    Physical Exam  Constitutional: Appears well-developed and well-nourished. No distress.  CVS: RRR, S1/S2 + Pulmonary: sounds little more congested, coughing, no wheezing  Abdominal: Soft. BS +,  no distension, tenderness, rebound or guarding.  Musculoskeletal: Normal range of motion. No edema and no tenderness.  Lymphadenopathy: No lymphadenopathy noted, cervical, inguinal. Neuro: Alert. Normal reflexes, muscle tone coordination. No cranial nerve deficit. Skin: Skin is warm and dry. No rash noted. Not diaphoretic. No erythema. No pallor.  Psychiatric: Normal mood and affect. Behavior, judgment, thought content normal.     Data Reviewed: I have personally reviewed following labs and imaging studies  CBC: Recent Labs  Lab 12/28/17 1440 12/29/17 0742 12/30/17 0527 12/31/17 0424  WBC 9.5 10.8* 11.1* 12.9*  HGB 10.6* 9.5* 9.9* 9.3*  HCT 30.5* 28.6* 28.9* 27.8*  MCV 76.3* 77.5* 77.1* 77.4*  PLT 454* 619* 665* 878*   Basic Metabolic Panel: Recent Labs  Lab 12/28/17 2020 12/28/17 2254 12/29/17 0742 12/31/17 0424 01/01/18 0747  NA 124* 123* 127* 127* 128*  K 4.6 4.4 4.3 4.6 4.8  CL 91* 90* 93* 99* 96*  CO2 22 21* 24 21* 24  GLUCOSE 112* 126* 101* 91 88  BUN 9 9 8 16 17   CREATININE 0.64 0.65 0.63 0.76 0.88  CALCIUM 8.5* 8.5* 8.6* 8.2*  8.5*   GFR: Estimated Creatinine Clearance: 57.5 mL/min (by C-G formula based on SCr of 0.88 mg/dL). Liver Function Tests: No results for input(s): AST, ALT, ALKPHOS, BILITOT, PROT, ALBUMIN in the last 168 hours. No results for input(s): LIPASE, AMYLASE in the last 168 hours. No results for input(s): AMMONIA in the last 168 hours. Coagulation Profile: No results for input(s): INR, PROTIME in the last 168 hours. Cardiac Enzymes: No results for input(s): CKTOTAL, CKMB, CKMBINDEX, TROPONINI in the last 168 hours. BNP (last 3 results) No results for input(s): PROBNP in the last 8760 hours. HbA1C: No results for input(s): HGBA1C in the last 72 hours. CBG: No results for input(s): GLUCAP in the last 168 hours. Lipid Profile: No results for input(s): CHOL, HDL, LDLCALC, TRIG, CHOLHDL, LDLDIRECT in the last 72 hours. Thyroid Function Tests: No results for input(s): TSH, T4TOTAL, FREET4, T3FREE, THYROIDAB in the last 72 hours. Anemia Panel: No results for input(s): VITAMINB12, FOLATE, FERRITIN, TIBC, IRON, RETICCTPCT in the last 72 hours. Urine analysis: No results found for: COLORURINE, APPEARANCEUR, LABSPEC, PHURINE, GLUCOSEU, HGBUR, BILIRUBINUR, KETONESUR, PROTEINUR, UROBILINOGEN, NITRITE, LEUKOCYTESUR Sepsis Labs: @LABRCNTIP (procalcitonin:4,lacticidven:4)   Acid Fast Smear (AFB)     Status: None   Collection Time: 12/19/17  5:29 PM  Result Value Ref Range Status   AFB Specimen Processing Concentration  Final   Acid Fast Smear Negative  Final    Comment: (NOTE) Performed At: [facility] [place], [place] [id number] Rush Farmer MD SJ:[id number]    Source (AFB) TISSUE  Final    Comment: RIGHT UPPER LUNG   Culture, fungus without smear     Status: None (Preliminary result)   Collection Time: 12/19/17  5:29 PM  Result Value Ref Range Status   Specimen Description TISSUE RIGHT UPPER LUNG  Final   Special Requests NONE  Final   Culture   Final    NO  FUNGUS ISOLATED AFTER 7 DAYS Performed at [facility], [address], [place], [place] [address]    Report Status PENDING  Incomplete  Aerobic/Anaerobic Culture (surgical/deep wound)     Status: None   Collection Time: 12/19/17  5:29 PM  Result Value Ref Range Status   Specimen Description TISSUE RIGHT UPPER LUNG  Final   Special Requests NONE  Final   Gram Stain   Final    FEW WBC PRESENT,BOTH PMN AND MONONUCLEAR NO ORGANISMS SEEN    Culture   Final    No growth aerobically or anaerobically. Performed at [facility], [place] [address], [place], [place] [address]    Report Status 12/24/2017 FINAL  Final  MRSA PCR Screening     Status: None   Collection Time: 12/28/17 10:46 PM  Result Value Ref Range Status   MRSA by PCR NEGATIVE NEGATIVE Final  Radiology Studies: Dg Chest 2 View Result Date: 12/28/2017 Stable nodular densities bilaterally.  No pneumothorax is noted. New small right-sided pleural effusion. Patchy changes in the upper lobes bilaterally increased from the previous exam which may represent some early infiltrate.     Scheduled Meds: . aspirin  81 mg Oral Daily  . enoxaparin   40 mg Subcutaneous Q24H   Continuous Infusions: . azithromycin Stopped (12/31/17 1442)  . cefOXitin Stopped (01/01/18 0540)  . tigecycline (TYGACIL) IVPB Stopped (01/01/18 0540)     LOS: 4 days    Time spent: 25 minutes  Greater than 50% of the time spent on counseling and coordinating the care.   Leisa Lenz, MD [facility] Pager [phone]  If 7PM-7AM, please contact night-coverage [email or web address] Password [email or web address] 01/01/2018, 11:59 AM

## 2018-01-01 NOTE — Progress Notes (Signed)
INFECTIOUS DISEASE PROGRESS NOTE  ID: [name] is a 67 y.o. male with  Principal Problem:   Lobar pneumonia ([place]) Active Problems:   G E R D   Lung nodules   Hyponatremia   Mycobacterial disease   Pleural effusion  Subjective: Resting quietly  Abtx:  Anti-infectives (From admission, onward)   Start     Dose/Rate Route Frequency Ordered Stop   12/30/17 0600  tigecycline (TYGACIL) 50 mg in sodium chloride 0.9 % 100 mL IVPB     50 mg 200 mL/hr over 30 Minutes Intravenous Every 12 hours 12/29/17 1747     12/30/17 0200  tigecycline (TYGACIL) 50 mg in sodium chloride 0.9 % 100 mL IVPB  Status:  Discontinued     50 mg 200 mL/hr over 30 Minutes Intravenous Every 12 hours 12/29/17 1345 12/29/17 1747   12/29/17 1430  cefOXitin (MEFOXIN) 2 g in sodium chloride 0.9 % 100 mL IVPB     2 g 200 mL/hr over 30 Minutes Intravenous Every 6 hours 12/29/17 1345     12/29/17 1400  azithromycin (ZITHROMAX) 500 mg in sodium chloride 0.9 % 250 mL IVPB     500 mg 250 mL/hr over 60 Minutes Intravenous Every 24 hours 12/29/17 1345     12/29/17 1345  tigecycline (TYGACIL) 100 mg in sodium chloride 0.9 % 100 mL IVPB    Note to Pharmacy:  Please schedule zofran 8mg  iv as anti-emetic prior to infusion   100 mg 200 mL/hr over 30 Minutes Intravenous  Once 12/29/17 1345 12/29/17 2138   12/29/17 0600  vancomycin (VANCOCIN) 500 mg in sodium chloride 0.9 % 100 mL IVPB  Status:  Discontinued     500 mg 100 mL/hr over 60 Minutes Intravenous Every 12 hours 12/28/17 1617 12/29/17 0858   12/28/17 2200  ceFEPIme (MAXIPIME) 1 g in sodium chloride 0.9 % 100 mL IVPB  Status:  Discontinued     1 g 200 mL/hr over 30 Minutes Intravenous Every 8 hours 12/28/17 2123 12/29/17 1342   12/28/17 1645  ceFEPIme (MAXIPIME) 2 g in sodium chloride 0.9 % 100 mL IVPB     2 g 200 mL/hr over 30 Minutes Intravenous  Once 12/28/17 1645 12/28/17 1757   12/28/17 1630  ceFEPIme (MAXIPIME) 1 g in sodium chloride 0.9 % 100 mL IVPB   Status:  Discontinued     1 g 200 mL/hr over 30 Minutes Intravenous  Once 12/28/17 1612 12/28/17 1645   12/28/17 1630  vancomycin (VANCOCIN) IVPB 1000 mg/200 mL premix     1,000 mg 200 mL/hr over 60 Minutes Intravenous  Once 12/28/17 1617 12/28/17 1910      Medications:  Scheduled: . aspirin  81 mg Oral Daily  . ondansetron (ZOFRAN) IV  4 mg Intravenous Q12H  . sodium chloride flush  3 mL Intravenous Q12H    Objective: Vital signs in last 24 hours: Temp:  [97.6 F (36.4 C)-98.7 F (37.1 C)] 97.6 F (36.4 C) (04/24 0629) Pulse Rate:  [102-113] 102 (04/24 0629) Resp:  [24-25] 24 (04/24 0629) BP: (132-157)/(92-98) 157/98 (04/24 0629) SpO2:  [95 %-100 %] 100 % (04/24 0629) Weight:  [49.9 kg (110 lb 1.6 oz)] 49.9 kg (110 lb 1.6 oz) (04/24 0629)   General appearance: no distress Resp: rhonchi anterior - bilateral Cardio: regular rate and rhythm GI: normal findings: bowel sounds normal and soft, non-tender  Lab Results Recent Labs    12/30/17 0527 12/31/17 0424 01/01/18 0747  WBC 11.1* 12.9*  --  HGB 9.9* 9.3*  --   HCT 28.9* 27.8*  --   NA  --  127* 128*  K  --  4.6 4.8  CL  --  99* 96*  CO2  --  21* 24  BUN  --  16 17  CREATININE  --  0.76 0.88   Liver Panel No results for input(s): PROT, ALBUMIN, AST, ALT, ALKPHOS, BILITOT, BILIDIR, IBILI in the last 72 hours. Sedimentation Rate No results for input(s): ESRSEDRATE in the last 72 hours. C-Reactive Protein No results for input(s): CRP in the last 72 hours.  Microbiology: Recent Results (from the past 240 hour(s))  MRSA PCR Screening     Status: None   Collection Time: 12/28/17 10:46 PM  Result Value Ref Range Status   MRSA by PCR NEGATIVE NEGATIVE Final    Comment:        The GeneXpert MRSA Assay (FDA approved for NASAL specimens only), is one component of a comprehensive MRSA colonization surveillance program. It is not intended to diagnose MRSA infection nor to guide or monitor treatment for MRSA  infections. Performed at [facility], [place] [address] [place], [place] [address]   Culture, blood (routine x 2) Call MD if unable to obtain prior to antibiotics being given     Status: None (Preliminary result)   Collection Time: 12/28/17 10:54 PM  Result Value Ref Range Status   Specimen Description BLOOD LEFT ARM  Final   Special Requests   Final    BOTTLES DRAWN AEROBIC AND ANAEROBIC Blood Culture results may not be optimal due to an inadequate volume of blood received in culture bottles   Culture   Final    NO GROWTH 3 DAYS Performed at [facility], [place] [address] [place], [place] [address]    Report Status PENDING  Incomplete  Culture, blood (routine x 2) Call MD if unable to obtain prior to antibiotics being given     Status: None (Preliminary result)   Collection Time: 12/28/17 10:54 PM  Result Value Ref Range Status   Specimen Description BLOOD RIGHT HAND  Final   Special Requests   Final    BOTTLES DRAWN AEROBIC AND ANAEROBIC Blood Culture adequate volume   Culture   Final    NO GROWTH 3 DAYS Performed at [facility], [place] [address], [place],  [address]    Report Status PENDING  Incomplete    Studies/Results: No results found.   Assessment/Plan: Lung nodules Non-TB mycobacterial infection Dyspnea  Total days of antibiotics: 4 cefoxitin, tigecycline, azithro  Will continue his current rx Will contact [facility] about the probe of his bacteria.  maintaining sats well on RA         Bobby Rumpf MD, FACP Infectious Diseases (pager) [phone] [email or web address] 01/01/2018, 9:38 AM  LOS: 4 days

## 2018-01-02 ENCOUNTER — Inpatient Hospital Stay (HOSPITAL_COMMUNITY): Payer: Medicare Other

## 2018-01-02 LAB — CULTURE, BLOOD (ROUTINE X 2)
CULTURE: NO GROWTH
Culture: NO GROWTH
Special Requests: ADEQUATE

## 2018-01-02 LAB — RAPID URINE DRUG SCREEN, HOSP PERFORMED
Amphetamines: NOT DETECTED
BENZODIAZEPINES: NOT DETECTED
Barbiturates: NOT DETECTED
Cocaine: NOT DETECTED
Opiates: POSITIVE — AB
Tetrahydrocannabinol: NOT DETECTED

## 2018-01-02 MED ORDER — IPRATROPIUM-ALBUTEROL 0.5-2.5 (3) MG/3ML IN SOLN
3.0000 mL | Freq: Four times a day (QID) | RESPIRATORY_TRACT | Status: DC
Start: 2018-01-02 — End: 2018-01-03
  Administered 2018-01-02 – 2018-01-03 (×2): 3 mL via RESPIRATORY_TRACT
  Filled 2018-01-02: qty 3

## 2018-01-02 MED ORDER — BENZONATATE 100 MG PO CAPS
200.0000 mg | ORAL_CAPSULE | Freq: Three times a day (TID) | ORAL | Status: DC
  Administered 2018-01-02 – 2018-01-08 (×15): 200 mg via ORAL
  Filled 2018-01-02 (×22): qty 2

## 2018-01-02 MED ORDER — METOPROLOL TARTRATE 25 MG PO TABS
25.0000 mg | ORAL_TABLET | Freq: Two times a day (BID) | ORAL | Status: DC
  Administered 2018-01-02 – 2018-01-06 (×9): 25 mg via ORAL
  Filled 2018-01-02 (×9): qty 1

## 2018-01-02 MED ORDER — DILTIAZEM HCL 60 MG PO TABS
30.0000 mg | ORAL_TABLET | Freq: Three times a day (TID) | ORAL | Status: DC
  Administered 2018-01-02 – 2018-01-06 (×13): 30 mg via ORAL
  Filled 2018-01-02 (×13): qty 1

## 2018-01-02 MED ORDER — FUROSEMIDE 10 MG/ML IJ SOLN
20.0000 mg | Freq: Once | INTRAMUSCULAR | Status: AC
  Administered 2018-01-02: 20 mg via INTRAVENOUS
  Filled 2018-01-02: qty 2

## 2018-01-02 NOTE — Plan of Care (Signed)
Pt still with poor appetite, able to tolerate ensure shake this am, sipping on ice water and coffee, states has no appetite for solid food at this time.

## 2018-01-02 NOTE — Consult Note (Signed)
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   Plan: Continue antibiotics per ID         Hypoxia is very minimal and use prn oxygen  Add duoneb Q6 hr for today  Give Dose of lasix  RPT xray in am if continue to have effusion draining it might help with hypoxia and diagnosis  Continue oxygen keep O2 sat>92  Plasma cells on lung nodule biopsy if continue to have issues get hematology involved or rpt ct to evaluate nodules as very rarely plasma cell malignancy can have similar presentation  Aspiration precation  Antibiotics per ID  Flu and pneumococal vaccine  Urine Drug Screen  Out patient PFT to evaluate for COPD  GI DVT prophylaxis  Bronchial hygiene protocol    ADMISSION DATE:  12/28/2017 CONSULTATION::Internal medicine  CHIEF COMPLAINT: persistent cough,sob, weight loss  HISTORY OF PRESENT ILLNESS:    This is a 67 year old male with history of coronary artery disease, asthma, who presented to the emergency room about a week ago for URI type symptoms.  He was diagnosed with pneumonia, he was given doxycycline to take for 7 days.  Prior to discharge from the ED he underwent sputum cultures.  EDP at that time discussed with infectious disease over the phone.  Patient went home, and continued to feel sick it despite antibiotics and decided to come back to the hospital.  He is experiencing progressive shortness of breath as well as an unrelenting cough which is occasionally productive but mostly dry.  Sputum cultures from prior ED visit showed pseudohyphae and yeast.  He has not followed consistently with primary care.  He was diagnosed with prostate cancer apparently 1 year ago but it held off on therapy.  He did have a PET scan done  in December which did not show evidence of any malignancy outside the prostate itself.   In early April, one of 3 specimnes found to be AFB+ at 2 wk mark from sputum specimen. He was seen by ID on 4/16 where he still had productive cough, wheezing, right sided pleuretic chest pain. Also reported having malaise, fatigue, roughly 6-7 lb/in 1 month of weight loss. At that time,none of his cultures had isolated anything except psuedohyphae on one culture thought to be colonizer.   He was readmitted to the hospital on 4/20 for worsening,labored cough, unable to sleep. One of his cx came back yesterday for +AFb but mTB PCR negative. Other NTM PCR testing is pending. He reports this morning unable to produce phlegm, felt it was stuck. His daughter is at his bedside  He also has no evidence of other unusual zoonotic exposures such as exposure to farm animals or pets or birds.  He was incarcerated many decades ago in jail for some period of time but other than that was never else in jail.  He has never traveled outside the [place] and is hardly traveled much in the [place] having grown up in [place] and lived in [place].  He has not been exposed to anyone who has tuberculosis.  He has not known to have an autoimmune disease.   4/25 We are asked as patient RA sats dropped from 97 to 93%. Patient suggest cough. But denies any  other complain except pain with coughing. CXR showed some Right sided effusion   Total days of antibiotics: 5 cefoxitin, tigecycline, azithro  Lung nodules  - CT guided core lung biopsy of right nodule on 4/11 - inflammation and fibrosis with numerous plasma cells     PAST MEDICAL HISTORY :  He  has a past medical history of BPH with urinary obstruction, CAD (coronary artery disease), COPD (chronic obstructive pulmonary disease) ([place]), DDD (degenerative disc disease), cervical, Diverticulosis of colon, Elevated PSA, GERD (gastroesophageal reflux  disease), History of adenomatous polyp of colon, History of hypertension, History of precordial chest pain, Prostate cancer ([place]), and Wears glasses.  PAST SURGICAL HISTORY: He  has a past surgical history that includes Colonoscopy (last one 12-14-2015); Cardiac catheterization (01-08-2003   dr Lyndel Safe); and Prostate biopsy (N/A, 07/25/2017).  No Known Allergies  No current facility-administered medications on file prior to encounter.    Current Outpatient Medications on File Prior to Encounter  Medication Sig  . acetaminophen (TYLENOL) 500 MG tablet Take 500 mg by mouth every 6 (six) hours as needed for headache (pain).   Marland Kitchen albuterol (PROVENTIL HFA;VENTOLIN HFA) 108 (90 Base) MCG/ACT inhaler Inhale 2 puffs into the lungs every 6 (six) hours as needed for wheezing or shortness of breath.  Marland Kitchen aspirin 81 MG chewable tablet Chew 1 tablet (81 mg total) by mouth daily.  Marland Kitchen dextromethorphan-guaiFENesin (MUCINEX DM) 30-600 MG 12hr tablet Take 1 tablet by mouth 2 (two) times daily.  . Multiple Vitamin (MULTIVITAMIN WITH MINERALS) TABS tablet Take 1 tablet by mouth daily.  Marland Kitchen OVER THE COUNTER MEDICATION Take 1 tablet by mouth 2 (two) times daily. Super Beta Prostate supplement  . OVER THE COUNTER MEDICATION Apply 1 application topically daily as needed (arthritis pain). Over the counter arthritis pain cream  . sildenafil (REVATIO) 20 MG tablet Take 60 mg by mouth daily as needed (erectile dysfunction).   . benzonatate (TESSALON) 100 MG capsule Take 1 capsule (100 mg total) by mouth every 8 (eight) hours. (Patient not taking: Reported on 12/08/2017)  . traMADol (ULTRAM) 50 MG tablet Take 1 tablet (50 mg total) by mouth every 6 (six) hours as needed. (Patient not taking: Reported on 12/08/2017)    FAMILY HISTORY:  His indicated that his mother is deceased. He indicated that his sister is deceased. He indicated that his brother is deceased.   SOCIAL HISTORY: He  reports that he quit smoking about 30 years  ago. His smoking use included cigarettes. He has a 30.00 pack-year smoking history. He quit smokeless tobacco use about 30 years ago. His smokeless tobacco use included snuff. He reports that he does not drink alcohol or use drugs.  REVIEW OF SYSTEMS:    Review of Systems  Constitutional: Positive for diaphoresis and malaise/fatigue. Negative for chills and fever.  Respiratory: Positive for cough and shortness of breath. Negative for hemoptysis and sputum production.   Cardiovascular: Positive for chest pain and orthopnea.  Gastrointestinal: Negative for heartburn and vomiting.  Genitourinary: Negative for dysuria.  Musculoskeletal: Negative for myalgias.  Neurological: Negative for dizziness and focal weakness.  Psychiatric/Behavioral: Negative for depression.        VITAL SIGNS: BP (!) 153/96 (BP Location: Right Arm)   Pulse 90   Temp 100 F (37.8 C) (Oral)   Resp 18   Ht 5\' 4"  (1.626 m)   Wt 51.1 kg (112 lb 9.6 oz)   SpO2 99%   BMI 19.33 kg/m        INTAKE /  OUTPUT: I/O last 3 completed shifts: In: 3007 [P.O.:300; I.V.:360; IV Piggyback:1050] Out: 2250 [Urine:2250]  PHYSICAL EXAMINATION: General:  Patient is in no acute distress,  Neuro:  A and O x 3, moving all extr.  HEENT: [place]/AT, [place];earae anitcteric Cardiovascular:  [id number] Lungs: bilateral BS, s wheeze or rales Abdomen:  Soft , BS , non-tender, no gross organomegaly Extr; s c.c/3    LABS:  BMET Recent Labs  Lab 12/29/17 0742 12/31/17 0424 01/01/18 0747  NA 127* 127* 128*  K 4.3 4.6 4.8  CL 93* 99* 96*  CO2 24 21* 24  BUN 8 16 17   CREATININE 0.63 0.76 0.88  GLUCOSE 101* 91 88    Electrolytes Recent Labs  Lab 12/29/17 0742 12/31/17 0424 01/01/18 0747  CALCIUM 8.6* 8.2* 8.5*    CBC Recent Labs  Lab 12/29/17 0742 12/30/17 0527 12/31/17 0424  WBC 10.8* 11.1* 12.9*  HGB 9.5* 9.9* 9.3*  HCT 28.6* 28.9* 27.8*  PLT 619* 665* 611*    Coag's No results for input(s): APTT, INR in the last  168 hours.  Sepsis Markers Recent Labs  Lab 12/28/17 1838  LATICACIDVEN 0.96    ABG No results for input(s): PHART, PCO2ART, PO2ART in the last 168 hours.  Liver Enzymes No results for input(s): AST, ALT, ALKPHOS, BILITOT, ALBUMIN in the last 168 hours.  Cardiac Enzymes No results for input(s): TROPONINI, PROBNP in the last 168 hours.  Glucose No results for input(s): GLUCAP in the last 168 hours.  Imaging No results found.        CULTURES: AFB smears x 2 negative AFB culture pending Expectorated sputum 4/9 few GPCS, few WBCS ANTIBIOTICS: azithromycin

## 2018-01-02 NOTE — Progress Notes (Signed)
INFECTIOUS DISEASE PROGRESS NOTE  ID: [name] is a 67 y.o. male with  Principal Problem:   Lobar pneumonia ([place]) Active Problems:   G E R D   Lung nodules   Hyponatremia   Mycobacterial disease   Pleural effusion  Subjective: C/o continued cough. No SOB.  No problems with anbx- no rash , no diarrhea.   Abtx:  Anti-infectives (From admission, onward)   Start     Dose/Rate Route Frequency Ordered Stop   12/30/17 0600  tigecycline (TYGACIL) 50 mg in sodium chloride 0.9 % 100 mL IVPB     50 mg 200 mL/hr over 30 Minutes Intravenous Every 12 hours 12/29/17 1747     12/30/17 0200  tigecycline (TYGACIL) 50 mg in sodium chloride 0.9 % 100 mL IVPB  Status:  Discontinued     50 mg 200 mL/hr over 30 Minutes Intravenous Every 12 hours 12/29/17 1345 12/29/17 1747   12/29/17 1430  cefOXitin (MEFOXIN) 2 g in sodium chloride 0.9 % 100 mL IVPB     2 g 200 mL/hr over 30 Minutes Intravenous Every 6 hours 12/29/17 1345     12/29/17 1400  azithromycin (ZITHROMAX) 500 mg in sodium chloride 0.9 % 250 mL IVPB     500 mg 250 mL/hr over 60 Minutes Intravenous Every 24 hours 12/29/17 1345     12/29/17 1345  tigecycline (TYGACIL) 100 mg in sodium chloride 0.9 % 100 mL IVPB    Note to Pharmacy:  Please schedule zofran 8mg  iv as anti-emetic prior to infusion   100 mg 200 mL/hr over 30 Minutes Intravenous  Once 12/29/17 1345 12/29/17 2138   12/29/17 0600  vancomycin (VANCOCIN) 500 mg in sodium chloride 0.9 % 100 mL IVPB  Status:  Discontinued     500 mg 100 mL/hr over 60 Minutes Intravenous Every 12 hours 12/28/17 1617 12/29/17 0858   12/28/17 2200  ceFEPIme (MAXIPIME) 1 g in sodium chloride 0.9 % 100 mL IVPB  Status:  Discontinued     1 g 200 mL/hr over 30 Minutes Intravenous Every 8 hours 12/28/17 2123 12/29/17 1342   12/28/17 1645  ceFEPIme (MAXIPIME) 2 g in sodium chloride 0.9 % 100 mL IVPB     2 g 200 mL/hr over 30 Minutes Intravenous  Once 12/28/17 1645 12/28/17 1757   12/28/17 1630   ceFEPIme (MAXIPIME) 1 g in sodium chloride 0.9 % 100 mL IVPB  Status:  Discontinued     1 g 200 mL/hr over 30 Minutes Intravenous  Once 12/28/17 1612 12/28/17 1645   12/28/17 1630  vancomycin (VANCOCIN) IVPB 1000 mg/200 mL premix     1,000 mg 200 mL/hr over 60 Minutes Intravenous  Once 12/28/17 1617 12/28/17 1910      Medications:  Scheduled: . aspirin  81 mg Oral Daily  . benzonatate  200 mg Oral TID  . chlorpheniramine-HYDROcodone  5 mL Oral Q12H  . diltiazem  30 mg Oral Q8H  . metoprolol tartrate  25 mg Oral BID  . ondansetron (ZOFRAN) IV  4 mg Intravenous Q12H  . sodium chloride flush  3 mL Intravenous Q12H    Objective: Vital signs in last 24 hours: Temp:  [98 F (36.7 C)-99.6 F (37.6 C)] 99.6 F (37.6 C) (04/25 0557) Pulse Rate:  [108-124] 124 (04/25 0557) Resp:  [18] 18 (04/24 1452) BP: (157-171)/(87-108) 162/108 (04/25 0557) SpO2:  [92 %-100 %] 92 % (04/25 0557) Weight:  [51.1 kg (112 lb 9.6 oz)] 51.1 kg (112 lb 9.6 oz) (  04/25 0500)   General appearance: alert, cooperative and no distress Resp: rhonchi anterior - bilateral Cardio: regular rate and rhythm GI: normal findings: bowel sounds normal and soft, non-tender  Lab Results Recent Labs    12/31/17 0424 01/01/18 0747  WBC 12.9*  --   HGB 9.3*  --   HCT 27.8*  --   NA 127* 128*  K 4.6 4.8  CL 99* 96*  CO2 21* 24  BUN 16 17  CREATININE 0.76 0.88   Liver Panel No results for input(s): PROT, ALBUMIN, AST, ALT, ALKPHOS, BILITOT, BILIDIR, IBILI in the last 72 hours. Sedimentation Rate No results for input(s): ESRSEDRATE in the last 72 hours. C-Reactive Protein No results for input(s): CRP in the last 72 hours.  Microbiology: Recent Results (from the past 240 hour(s))  MRSA PCR Screening     Status: None   Collection Time: 12/28/17 10:46 PM  Result Value Ref Range Status   MRSA by PCR NEGATIVE NEGATIVE Final    Comment:        The GeneXpert MRSA Assay (FDA approved for NASAL specimens only),  is one component of a comprehensive MRSA colonization surveillance program. It is not intended to diagnose MRSA infection nor to guide or monitor treatment for MRSA infections. Performed at [facility], [place] [address], [place], [place] [address]   Culture, blood (routine x 2) Call MD if unable to obtain prior to antibiotics being given     Status: None (Preliminary result)   Collection Time: 12/28/17 10:54 PM  Result Value Ref Range Status   Specimen Description BLOOD LEFT ARM  Final   Special Requests   Final    BOTTLES DRAWN AEROBIC AND ANAEROBIC Blood Culture results may not be optimal due to an inadequate volume of blood received in culture bottles   Culture   Final    NO GROWTH 4 DAYS Performed at [facility], [place] [address] [place], [place] [address]    Report Status PENDING  Incomplete  Culture, blood (routine x 2) Call MD if unable to obtain prior to antibiotics being given     Status: None (Preliminary result)   Collection Time: 12/28/17 10:54 PM  Result Value Ref Range Status   Specimen Description BLOOD RIGHT HAND  Final   Special Requests   Final    BOTTLES DRAWN AEROBIC AND ANAEROBIC Blood Culture adequate volume   Culture   Final    NO GROWTH 4 DAYS Performed at [facility], [place] [address], [place], [place] [address]    Report Status PENDING  Incomplete    Studies/Results: Dg Chest Port 1 View  Result Date: 01/01/2018 CLINICAL DATA:  Cough. EXAM: PORTABLE CHEST 1 VIEW COMPARISON:  12/28/2017 FINDINGS: Lungs are adequately inflated demonstrate persistent minimal patchy opacification of the right mid to upper lung slightly improved with mild linear scarring/atelectasis over the left midlung. Worsening small right pleural effusion. No pneumothorax. Cardiomediastinal silhouette and remainder of the exam is unchanged. IMPRESSION: Minimal patchy opacification over the right mid to upper lung slightly improved with minimal linear scarring over the left  midlung. Worsening small right pleural effusion. Electronically Signed   By: Marin Olp M.D.   On: 01/01/2018 13:57     Assessment/Plan: Lung nodules Non-TB mycobacterial infection Dyspnea  Total days of antibiotics: 5 cefoxitin, tigecycline, azithro  Will continue his current anbx I spoke with [facility]. His AFB Cx is still in progress- finalized on may 25. will devise po options  when he is ready for d/c.          Bobby Rumpf MD, FACP Infectious Diseases (pager) [phone] [email or web address] 01/02/2018, 1:20 PM  LOS: 5 days

## 2018-01-02 NOTE — Care Management Important Message (Signed)
Important Message  Patient Details  Name: [name] MRN: [id number] Date of Birth: 1950-09-24   Medicare Important Message Given:  Yes    Tiajah Oyster P Avila Albritton 01/02/2018, 2:02 PM

## 2018-01-02 NOTE — Progress Notes (Signed)
Patient ID: [name], male   DOB: 02/16/1951, 67 y.o.   MRN: [id number]  PROGRESS NOTE    Benson Porcaro  RKY:[id number] DOB: Nov 23, 1950 DOA: 12/28/2017  PCP: Bernerd Limbo, MD   Brief Narrative:  67 year old male with past medical history of COPD, prostate cancer, smoker who presented to [facility] with worsening shortness of breath and cough. Chest x-ray showed developing infiltrates in the upper and lower lung zones, patient was started on antibiotics and referred for admission.   Assessment & Plan:   Principal Problem:   Lobar pneumonia ([place]) / Leukocytosis / NTM pulmonary infection - CXR showed stable nodular densities bilaterally.  No pneumothorax noted. New small right-sided pleural effusion. Patchy changes in the upper lobes bilaterally increased from the previous exam which may represent some early infiltrate.  - Was started on vanco and cefepime, stopped vanco 4/21, stopped cefepime 4/21 - Continue azithromycin 500 mg IV, cefoxitin 2 gm IV every 4 hours and tigecycline  - Robitussin an dmucinex caused pt to have more coughing spells so we stopped it and used tussionex from 4/24 - Add tessalon capsules this am - CXR repeated 4/24 - minimal patchy opacification over the right mid to upper lung slightly improved with minimal linear scarring over the left midlung. Worsening small right pleural effusion.  - Monitor resp status - Oxygen sats in high 90% on room air  Active Problems:   Lung nodules  - CT guided core lung biopsy of right nodule on 4/11 - inflammation and fibrosis with numerous plasma cells    Hyponatremia - Dehydration versus acute lung process - Sodium 128    Anemia of chronic disease - Hgb stable     Moderate protein calorie malnutrition / Failure to thrive in adult  - In the context of chronic illness - Diet as tolerated     DVT prophylaxis: SCD's Code Status: full code  Family Communication: fiance at bedside Disposition Plan: home once resp status  better and cough improves    Consultants:   ID  Procedures:   None  Antimicrobials:   Vanco 4/20 --> 4/21  Cefepime 4/20 --> 4/21  Azithro, cefoxitin, tigecycline 4/21 -->   Subjective: No overnight events.  Objective: Vitals:   01/01/18 1452 01/01/18 2021 01/02/18 0500 01/02/18 0557  BP: (!) 171/95 (!) 157/87  (!) 162/108  Pulse: (!) 110 (!) 108  (!) 124  Resp: 18     Temp: 98 F (36.7 C) 98.8 F (37.1 C)  99.6 F (37.6 C)  TempSrc: Oral Oral  Oral  SpO2: 100% 94%  92%  Weight:   51.1 kg (112 lb 9.6 oz)   Height:        Intake/Output Summary (Last 24 hours) at 01/02/2018 1200 Last data filed at 01/02/2018 0500 Gross per 24 hour  Intake 710 ml  Output 1100 ml  Net -390 ml   Filed Weights   12/31/17 0605 01/01/18 0629 01/02/18 0500  Weight: 50.5 kg (111 lb 4.8 oz) 49.9 kg (110 lb 1.6 oz) 51.1 kg (112 lb 9.6 oz)   Physical Exam  Constitutional: Appears well-developed and well-nourished. No distress.  HENT: Normocephalic. External right and left ear normal. Oropharynx is clear and moist.  Eyes: Conjunctivae and EOM are normal. PERRLA, no scleral icterus.  Neck: Normal ROM. Neck supple. No JVD. No tracheal deviation. No thyromegaly.  CVS: RRR, S1/S2 +, no murmurs, no gallops, no carotid bruit.  Pulmonary: diminished and coarse, no wheezing .  Abdominal: Soft. BS +,  no  distension, tenderness, rebound or guarding.  Musculoskeletal: Normal range of motion. No edema and no tenderness.  Lymphadenopathy: No lymphadenopathy noted, cervical, inguinal. Neuro: Alert. Normal reflexes, muscle tone coordination. No cranial nerve deficit. Skin: Skin is warm and dry.  Psychiatric: Normal mood and affect. Behavior, judgment, thought content normal.    Data Reviewed: I have personally reviewed following labs and imaging studies  CBC: Recent Labs  Lab 12/28/17 1440 12/29/17 0742 12/30/17 0527 12/31/17 0424  WBC 9.5 10.8* 11.1* 12.9*  HGB 10.6* 9.5* 9.9* 9.3*  HCT  30.5* 28.6* 28.9* 27.8*  MCV 76.3* 77.5* 77.1* 77.4*  PLT 454* 619* 665* 242*   Basic Metabolic Panel: Recent Labs  Lab 12/28/17 2020 12/28/17 2254 12/29/17 0742 12/31/17 0424 01/01/18 0747  NA 124* 123* 127* 127* 128*  K 4.6 4.4 4.3 4.6 4.8  CL 91* 90* 93* 99* 96*  CO2 22 21* 24 21* 24  GLUCOSE 112* 126* 101* 91 88  BUN 9 9 8 16 17   CREATININE 0.64 0.65 0.63 0.76 0.88  CALCIUM 8.5* 8.5* 8.6* 8.2* 8.5*   GFR: Estimated Creatinine Clearance: 58.9 mL/min (by C-G formula based on SCr of 0.88 mg/dL). Liver Function Tests: No results for input(s): AST, ALT, ALKPHOS, BILITOT, PROT, ALBUMIN in the last 168 hours. No results for input(s): LIPASE, AMYLASE in the last 168 hours. No results for input(s): AMMONIA in the last 168 hours. Coagulation Profile: No results for input(s): INR, PROTIME in the last 168 hours. Cardiac Enzymes: No results for input(s): CKTOTAL, CKMB, CKMBINDEX, TROPONINI in the last 168 hours. BNP (last 3 results) No results for input(s): PROBNP in the last 8760 hours. HbA1C: No results for input(s): HGBA1C in the last 72 hours. CBG: No results for input(s): GLUCAP in the last 168 hours. Lipid Profile: No results for input(s): CHOL, HDL, LDLCALC, TRIG, CHOLHDL, LDLDIRECT in the last 72 hours. Thyroid Function Tests: No results for input(s): TSH, T4TOTAL, FREET4, T3FREE, THYROIDAB in the last 72 hours. Anemia Panel: No results for input(s): VITAMINB12, FOLATE, FERRITIN, TIBC, IRON, RETICCTPCT in the last 72 hours. Urine analysis: No results found for: COLORURINE, APPEARANCEUR, LABSPEC, PHURINE, GLUCOSEU, HGBUR, BILIRUBINUR, KETONESUR, PROTEINUR, UROBILINOGEN, NITRITE, LEUKOCYTESUR Sepsis Labs: @LABRCNTIP (procalcitonin:4,lacticidven:4)   Acid Fast Smear (AFB)     Status: None   Collection Time: 12/19/17  5:29 PM  Result Value Ref Range Status   AFB Specimen Processing Concentration  Final   Acid Fast Smear Negative  Final    Comment: (NOTE) Performed At:  [facility] [place], [place] [id number] Rush Farmer MD VQ:[id number]    Source (AFB) TISSUE  Final    Comment: RIGHT UPPER LUNG   Culture, fungus without smear     Status: None (Preliminary result)   Collection Time: 12/19/17  5:29 PM  Result Value Ref Range Status   Specimen Description TISSUE RIGHT UPPER LUNG  Final   Special Requests NONE  Final   Culture   Final    NO FUNGUS ISOLATED AFTER 7 DAYS Performed at [facility], [place] [address], [place], [place] [address]    Report Status PENDING  Incomplete  Aerobic/Anaerobic Culture (surgical/deep wound)     Status: None   Collection Time: 12/19/17  5:29 PM  Result Value Ref Range Status   Specimen Description TISSUE RIGHT UPPER LUNG  Final   Special Requests NONE  Final   Gram Stain   Final    FEW WBC PRESENT,BOTH PMN AND MONONUCLEAR NO ORGANISMS SEEN  Culture   Final    No growth aerobically or anaerobically. Performed at [facility], [place] [address], [place], [place] [address]    Report Status 12/24/2017 FINAL  Final  MRSA PCR Screening     Status: None   Collection Time: 12/28/17 10:46 PM  Result Value Ref Range Status   MRSA by PCR NEGATIVE NEGATIVE Final      Radiology Studies: Dg Chest 2 View Result Date: 12/28/2017 Stable nodular densities bilaterally.  No pneumothorax is noted. New small right-sided pleural effusion. Patchy changes in the upper lobes bilaterally increased from the previous exam which may represent some early infiltrate.     Scheduled Meds: . aspirin  81 mg Oral Daily  . enoxaparin   40 mg Subcutaneous Q24H   Continuous Infusions: . azithromycin Stopped (01/01/18 1541)  . cefOXitin 2 g (01/02/18 1104)  . tigecycline (TYGACIL) IVPB 50 mg (01/02/18 0609)     LOS: 5 days    Time spent: 25 minutes  Greater than 50% of the time spent on counseling and coordinating the care.   Leisa Lenz, MD [facility] Pager [phone]  If 7PM-7AM,  please contact night-coverage [email or web address] Password [email or web address] 01/02/2018, 12:00 PM

## 2018-01-03 ENCOUNTER — Encounter (HOSPITAL_COMMUNITY): Payer: Self-pay | Admitting: Radiology

## 2018-01-03 ENCOUNTER — Inpatient Hospital Stay (HOSPITAL_COMMUNITY): Payer: Medicare Other

## 2018-01-03 DIAGNOSIS — J181 Lobar pneumonia, unspecified organism: Principal | ICD-10-CM

## 2018-01-03 DIAGNOSIS — J90 Pleural effusion, not elsewhere classified: Secondary | ICD-10-CM

## 2018-01-03 HISTORY — PX: IR THORACENTESIS ASP PLEURAL SPACE W/IMG GUIDE: IMG5380

## 2018-01-03 LAB — BODY FLUID CELL COUNT WITH DIFFERENTIAL
Eos, Fluid: 7 %
LYMPHS FL: 17 %
Monocyte-Macrophage-Serous Fluid: 10 % — ABNORMAL LOW (ref 50–90)
NEUTROPHIL FLUID: 66 % — AB (ref 0–25)
Total Nucleated Cell Count, Fluid: 5406 cu mm — ABNORMAL HIGH (ref 0–1000)

## 2018-01-03 LAB — PROTEIN, PLEURAL OR PERITONEAL FLUID: Total protein, fluid: 3.8 g/dL

## 2018-01-03 LAB — GLUCOSE, PLEURAL OR PERITONEAL FLUID: Glucose, Fluid: 107 mg/dL

## 2018-01-03 LAB — LACTATE DEHYDROGENASE, PLEURAL OR PERITONEAL FLUID: LD, Fluid: 299 U/L — ABNORMAL HIGH (ref 3–23)

## 2018-01-03 MED ORDER — LIDOCAINE HCL (PF) 1 % IJ SOLN
INTRAMUSCULAR | Status: DC | PRN
  Administered 2018-01-03: 5 mL

## 2018-01-03 MED ORDER — IPRATROPIUM-ALBUTEROL 0.5-2.5 (3) MG/3ML IN SOLN
3.0000 mL | RESPIRATORY_TRACT | Status: DC | PRN
Start: 2018-01-03 — End: 2018-01-09

## 2018-01-03 MED ORDER — DEXTROSE 5 % IV SOLN
3.0000 g | Freq: Four times a day (QID) | INTRAVENOUS | Status: AC
  Administered 2018-01-03 – 2018-01-06 (×13): 3 g via INTRAVENOUS
  Filled 2018-01-03 (×15): qty 3

## 2018-01-03 MED ORDER — IPRATROPIUM-ALBUTEROL 0.5-2.5 (3) MG/3ML IN SOLN
3.0000 mL | Freq: Two times a day (BID) | RESPIRATORY_TRACT | Status: DC
  Administered 2018-01-03 – 2018-01-04 (×3): 3 mL via RESPIRATORY_TRACT
  Filled 2018-01-03 (×3): qty 3

## 2018-01-03 MED ORDER — LIDOCAINE HCL 1 % IJ SOLN
INTRAMUSCULAR | Status: AC
  Filled 2018-01-03: qty 20

## 2018-01-03 NOTE — Evaluation (Signed)
Physical Therapy Evaluation Patient Details Name: [name] MRN: [id number] DOB: 1950-09-27 Today's Date: 01/03/2018   History of Present Illness  Pt adm with SOB and found to have PNA. PMH - prostate CA, copd, cad, htn  Clinical Impression  Pt admitted with above diagnosis and presents to PT with functional limitations due to deficits listed below (See PT problem list). Pt needs skilled PT to maximize independence and safety to allow discharge to home. Expect pt will progress relatively quickly toward his baseline. Needs to amb in halls at least 3x/day with staff.     Follow Up Recommendations Home health PT(May progress and not need at time of dc)    Equipment Recommendations  None recommended by PT    Recommendations for Other Services       Precautions / Restrictions Restrictions Weight Bearing Restrictions: No      Mobility  Bed Mobility               General bed mobility comments: Pt sitting EOB  Transfers Overall transfer level: Needs assistance Equipment used: None Transfers: Sit to/from Stand Sit to Stand: Min guard         General transfer comment: Assist for safety  Ambulation/Gait Ambulation/Gait assistance: Min guard;Supervision Ambulation Distance (Feet): 175 Feet Assistive device: None Gait Pattern/deviations: Step-through pattern;Decreased stride length;Drifts right/left Gait velocity: decr Gait velocity interpretation: 1.31 - 2.62 ft/sec, indicative of limited community ambulator General Gait Details: Slightly unsteady but no overt loss of balance. Pt with incr stability as distance incr. Progressed to supervision only. Amb on RA with SpO2 89% after amb and quickly returning to >90%  Stairs            Wheelchair Mobility    Modified Rankin (Stroke Patients Only)       Balance Overall balance assessment: Needs assistance Sitting-balance support: No upper extremity supported;Feet supported Sitting balance-Leahy Scale: Good      Standing balance support: No upper extremity supported Standing balance-Leahy Scale: Fair                               Pertinent Vitals/Pain Pain Assessment: No/denies pain    Home Living Family/patient expects to be discharged to:: Private residence Living Arrangements: Alone Available Help at Discharge: Friend(s);Available PRN/intermittently Type of Home: Apartment Home Access: Level entry     Home Layout: One level Home Equipment: None      Prior Function Level of Independence: Independent               Hand Dominance        Extremity/Trunk Assessment   Upper Extremity Assessment Upper Extremity Assessment: Defer to OT evaluation    Lower Extremity Assessment Lower Extremity Assessment: Generalized weakness    Cervical / Trunk Assessment Cervical / Trunk Assessment: Kyphotic  Communication   Communication: Expressive difficulties(Pt mumbling and difficult to understand)  Cognition Arousal/Alertness: Awake/alert Behavior During Therapy: Flat affect Overall Cognitive Status: No family/caregiver present to determine baseline cognitive functioning                                 General Comments: Pt a little slow to respond but expect this may be baseline. Pt oriented to person, place, time.      General Comments      Exercises     Assessment/Plan    PT Assessment Patient needs continued PT  services  PT Problem List Decreased strength;Decreased activity tolerance;Decreased balance;Decreased mobility       PT Treatment Interventions DME instruction;Gait training;Functional mobility training;Therapeutic activities;Therapeutic exercise;Balance training;Patient/family education    PT Goals (Current goals can be found in the Care Plan section)  Acute Rehab PT Goals Patient Stated Goal: return home PT Goal Formulation: With patient Time For Goal Achievement: 01/10/18 Potential to Achieve Goals: Good    Frequency  Min 3X/week   Barriers to discharge Decreased caregiver support Lives alone    Co-evaluation               AM-PAC PT "6 Clicks" Daily Activity  Outcome Measure Difficulty turning over in bed (including adjusting bedclothes, sheets and blankets)?: None Difficulty moving from lying on back to sitting on the side of the bed? : None Difficulty sitting down on and standing up from a chair with arms (e.g., wheelchair, bedside commode, etc,.)?: A Little Help needed moving to and from a bed to chair (including a wheelchair)?: A Little Help needed walking in hospital room?: A Little Help needed climbing 3-5 steps with a railing? : A Little 6 Click Score: 20    End of Session Equipment Utilized During Treatment: Gait belt Activity Tolerance: No increased pain Patient left: in chair;with call bell/phone within reach Nurse Communication: Mobility status PT Visit Diagnosis: Unsteadiness on feet (R26.81);Muscle weakness (generalized) (M62.81)    Time: 1001-1018 PT Time Calculation (min) (ACUTE ONLY): 17 min   Charges:   PT Evaluation $PT Eval Moderate Complexity: 1 Mod     PT G CodesMarland Kitchen        [facility] 01/03/2018, 12:53 PM

## 2018-01-03 NOTE — Progress Notes (Signed)
Pt's thoracentesis site is covered with a band aid. Which is clean dry and intact.

## 2018-01-03 NOTE — Progress Notes (Signed)
[facility]  PULMONARY / CRITICAL CARE MEDICINE   Name: [name] MRN: [id number] DOB: 01-08-1951      ADMISSION DATE:  12/28/2017 CONSULTATION::Internal medicine  CHIEF COMPLAINT: persistent cough,sob, weight loss  HISTORY OF PRESENT ILLNESS:    This is a 67 year old male with history of coronary artery disease, asthma, who presented to the emergency room about a week ago for URI type symptoms.  He was diagnosed with pneumonia, he was given doxycycline to take for 7 days.  Prior to discharge from the ED he underwent sputum cultures.  EDP at that time discussed with infectious disease over the phone.  Patient went home, and continued to feel sick it despite antibiotics and decided to come back to the hospital.  He is experiencing progressive shortness of breath as well as an unrelenting cough which is occasionally productive but mostly dry.  Sputum cultures from prior ED visit showed pseudohyphae and yeast.  He has not followed consistently with primary care.  He was diagnosed with prostate cancer apparently 1 year ago but it held off on therapy.  He did have a PET scan done in December which did not show evidence of any malignancy outside the prostate itself.   In early April, one of 3 specimnes found to be AFB+ at 2 wk mark from sputum specimen. He was seen by ID on 4/16 where he still had productive cough, wheezing, right sided pleuretic chest pain. Also reported having malaise, fatigue, roughly 6-7 lb/in 1 month of weight loss. At that time,none of his cultures had isolated anything except psuedohyphae on one culture thought to be colonizer.   He was readmitted to the hospital on 4/20 for worsening,labored cough, unable to sleep. One of his cx came back yesterday for +AFb but mTB PCR negative. Other NTM PCR testing is pending. He reports this morning unable to produce phlegm, felt it was stuck. His daughter is at his bedside  He also has no evidence of other unusual zoonotic exposures such as  exposure to farm animals or pets or birds.  He was incarcerated many decades ago in jail for some period of time but other than that was never else in jail.  He has never traveled outside the [place] and is hardly traveled much in the [place] having grown up in [place] and lived in [place].  He has not been exposed to anyone who has tuberculosis.  He has not known to have an autoimmune disease.   4/25 We are asked as patient RA sats dropped from 97 to 93%. Patient suggest cough. But denies any other complain except pain with coughing. CXR showed some Right sided effusion   Total days of antibiotics:  cefoxitin, tigecycline, azithro  Intake/Output Summary (Last 24 hours) at 01/03/2018 1036 Last data filed at 01/03/2018 0646 Gross per 24 hour  Intake 1290 ml  Output 1700 ml  Net -410 ml   Recent Labs  Lab 12/29/17 0742 12/31/17 0424 01/01/18 0747  NA 127* 127* 128*   Recent Labs  Lab 12/29/17 0742 12/31/17 0424 01/01/18 0747  K 4.3 4.6 4.8   Filed Weights   01/01/18 0629 01/02/18 0500 01/03/18 0502  Weight: 49.9 kg (110 lb 1.6 oz) 51.1 kg (112 lb 9.6 oz) 50.3 kg (110 lb 14.4 oz)    Lung nodules  - CT guided core lung biopsy of right nodule on 4/11 - inflammation and fibrosis with numerous plasma cells  VITAL SIGNS: BP (!) 134/94   Pulse 93  Temp 97.9 F (36.6 C) (Oral)   Resp 18   Ht 5\' 4"  (1.626 m)   Wt 50.3 kg (110 lb 14.4 oz)   SpO2 92%   BMI 19.04 kg/m        INTAKE / OUTPUT: I/O last 3 completed shifts: In: 1550 [P.O.:300; IV Piggyback:1250] Out: 2600 [Urine:2600]  PHYSICAL EXAMINATION: General:  Patient is in no acute distress,  Neuro:  A and O x 3, moving all extr.  HEENT: [place]/AT, [place];earae anitcteric Cardiovascular:  [id number] Lungs: bilateral BS, s wheeze or rales Abdomen:  Soft , BS , non-tender, no gross organomegaly Extr; s c.c/3    LABS:  BMET Recent Labs  Lab 12/29/17 0742 12/31/17 0424 01/01/18 0747  NA  127* 127* 128*  K 4.3 4.6 4.8  CL 93* 99* 96*  CO2 24 21* 24  BUN 8 16 17   CREATININE 0.63 0.76 0.88  GLUCOSE 101* 91 88    Electrolytes Recent Labs  Lab 12/29/17 0742 12/31/17 0424 01/01/18 0747  CALCIUM 8.6* 8.2* 8.5*    CBC Recent Labs  Lab 12/29/17 0742 12/30/17 0527 12/31/17 0424  WBC 10.8* 11.1* 12.9*  HGB 9.5* 9.9* 9.3*  HCT 28.6* 28.9* 27.8*  PLT 619* 665* 611*    Coag's No results for input(s): APTT, INR in the last 168 hours.  Sepsis Markers Recent Labs  Lab 12/28/17 1838  LATICACIDVEN 0.96    ABG No results for input(s): PHART, PCO2ART, PO2ART in the last 168 hours.  Liver Enzymes No results for input(s): AST, ALT, ALKPHOS, BILITOT, ALBUMIN in the last 168 hours.  Cardiac Enzymes No results for input(s): TROPONINI, PROBNP in the last 168 hours.  Glucose No results for input(s): GLUCAP in the last 168 hours.  Imaging Dg Chest Port 1 View  Result Date: 01/03/2018 CLINICAL DATA:  Pleural effusion. EXAM: PORTABLE CHEST 1 VIEW COMPARISON:  Radiograph of January 02, 2018. FINDINGS: Stable cardiomediastinal silhouette. No pneumothorax or pleural effusion is noted. Stable right upper and lower lobe opacities are noted concerning for pneumonia or atelectasis. Small right pleural effusion is noted. Minimal left basilar subsegmental atelectasis is noted with pleural effusion. Bony thorax is unremarkable. IMPRESSION: Stable right lung opacities are noted concerning for pneumonia or atelectasis. Stable mild right pleural effusion is noted. Minimal left basilar subsegmental atelectasis is noted with minimal left pleural effusion. Electronically Signed   By: Marijo Conception, M.D.   On: 01/03/2018 09:46   Dg Chest Port 1 View  Result Date: 01/02/2018 CLINICAL DATA:  Short of breath EXAM: PORTABLE CHEST 1 VIEW COMPARISON:  01/01/2018, 12/28/2017, 12/19/2017, CT chest 12/08/2017 FINDINGS: Similar appearance of small right pleural effusion and upper lobe right greater  than left nodular foci of airspace disease. Slight increased atelectasis or infiltrate at the right base. Stable cardiomediastinal silhouette. No pneumothorax. IMPRESSION: 1. No significant change in small right greater than left pleural effusions and right greater than left upper lobe nodules. Mild diffuse ground-glass opacity in the right upper lobe, no change compared to 01/01/2018, slight increased compared to 12/28/2017. Electronically Signed   By: Donavan Foil M.D.   On: 01/02/2018 19:57          CULTURES: AFB smears x 2 negative AFB culture pending Expectorated sputum 4/9 few GPCS, few WBCS ANTIBIOTICS: azithromycin     Impression: Principal Problem:   Lobar pneumonia (HCC)   G E R D   Lung nodules   Hyponatremia   Mycobacterial disease   Pleural effusion   Cough  Hyponatremia   Plan: Currently on room air with O2 sats of 94%  He is a -410 cc from Lasix on 01/02/2018  Chest x-ray on 01/03/2018 shows no discernible changes small to moderate right pleural effusion minimal left effusion.  We will continue diuresis and continue to monitor with chest x-ray intermittently.  Continue current antibiotics monitor culture data.  No infectious disease is controlled with antibiotics at this time.  Hyponatremia will be continued to be monitored by trial  01/03/2018 pulmonary critical care will be available as needed.  Richardson Landry Jassmine Vandruff ACNP [facility] Pager [phone] till 1 pm If no answer page [phone] 01/03/2018, 10:40 AM

## 2018-01-03 NOTE — Progress Notes (Signed)
INFECTIOUS DISEASE PROGRESS NOTE  ID: [name] is a 67 y.o. male with  Principal Problem:   Lobar pneumonia ([facility]) Active Problems:   G E R D   Lung nodules   Hyponatremia   Mycobacterial disease   Pleural effusion  Subjective: Persistent cough  Abtx:  Anti-infectives (From admission, onward)   Start     Dose/Rate Route Frequency Ordered Stop   12/30/17 0600  tigecycline (TYGACIL) 50 mg in sodium chloride 0.9 % 100 mL IVPB     50 mg 200 mL/hr over 30 Minutes Intravenous Every 12 hours 12/29/17 1747     12/30/17 0200  tigecycline (TYGACIL) 50 mg in sodium chloride 0.9 % 100 mL IVPB  Status:  Discontinued     50 mg 200 mL/hr over 30 Minutes Intravenous Every 12 hours 12/29/17 1345 12/29/17 1747   12/29/17 1430  cefOXitin (MEFOXIN) 2 g in sodium chloride 0.9 % 100 mL IVPB     2 g 200 mL/hr over 30 Minutes Intravenous Every 6 hours 12/29/17 1345     12/29/17 1400  azithromycin (ZITHROMAX) 500 mg in sodium chloride 0.9 % 250 mL IVPB     500 mg 250 mL/hr over 60 Minutes Intravenous Every 24 hours 12/29/17 1345     12/29/17 1345  tigecycline (TYGACIL) 100 mg in sodium chloride 0.9 % 100 mL IVPB    Note to Pharmacy:  Please schedule zofran 8mg  iv as anti-emetic prior to infusion   100 mg 200 mL/hr over 30 Minutes Intravenous  Once 12/29/17 1345 12/29/17 2138   12/29/17 0600  vancomycin (VANCOCIN) 500 mg in sodium chloride 0.9 % 100 mL IVPB  Status:  Discontinued     500 mg 100 mL/hr over 60 Minutes Intravenous Every 12 hours 12/28/17 1617 12/29/17 0858   12/28/17 2200  ceFEPIme (MAXIPIME) 1 g in sodium chloride 0.9 % 100 mL IVPB  Status:  Discontinued     1 g 200 mL/hr over 30 Minutes Intravenous Every 8 hours 12/28/17 2123 12/29/17 1342   12/28/17 1645  ceFEPIme (MAXIPIME) 2 g in sodium chloride 0.9 % 100 mL IVPB     2 g 200 mL/hr over 30 Minutes Intravenous  Once 12/28/17 1645 12/28/17 1757   12/28/17 1630  ceFEPIme (MAXIPIME) 1 g in sodium chloride 0.9 % 100 mL IVPB   Status:  Discontinued     1 g 200 mL/hr over 30 Minutes Intravenous  Once 12/28/17 1612 12/28/17 1645   12/28/17 1630  vancomycin (VANCOCIN) IVPB 1000 mg/200 mL premix     1,000 mg 200 mL/hr over 60 Minutes Intravenous  Once 12/28/17 1617 12/28/17 1910      Medications:  Scheduled: . aspirin  81 mg Oral Daily  . benzonatate  200 mg Oral TID  . chlorpheniramine-HYDROcodone  5 mL Oral Q12H  . diltiazem  30 mg Oral Q8H  . ipratropium-albuterol  3 mL Nebulization BID  . metoprolol tartrate  25 mg Oral BID  . ondansetron (ZOFRAN) IV  4 mg Intravenous Q12H  . sodium chloride flush  3 mL Intravenous Q12H    Objective: Vital signs in last 24 hours: Temp:  [97.9 F (36.6 C)-100.3 F (37.9 C)] 97.9 F (36.6 C) (04/26 0502) Pulse Rate:  [82-103] 93 (04/26 0919) Resp:  [18-22] 18 (04/26 0502) BP: (134-157)/(72-104) 134/94 (04/26 0919) SpO2:  [90 %-99 %] 92 % (04/26 0919) Weight:  [50.3 kg (110 lb 14.4 oz)] 50.3 kg (110 lb 14.4 oz) (04/26 0502)   General appearance:  alert, cooperative and mild distress Resp: rhonchi anterior - bilateral Cardio: regular rate and rhythm GI: normal findings: bowel sounds normal and soft, non-tender  Lab Results Recent Labs    01/01/18 0747  NA 128*  K 4.8  CL 96*  CO2 24  BUN 17  CREATININE 0.88   Liver Panel No results for input(s): PROT, ALBUMIN, AST, ALT, ALKPHOS, BILITOT, BILIDIR, IBILI in the last 72 hours. Sedimentation Rate No results for input(s): ESRSEDRATE in the last 72 hours. C-Reactive Protein No results for input(s): CRP in the last 72 hours.  Microbiology: Recent Results (from the past 240 hour(s))  MRSA PCR Screening     Status: None   Collection Time: 12/28/17 10:46 PM  Result Value Ref Range Status   MRSA by PCR NEGATIVE NEGATIVE Final    Comment:        The GeneXpert MRSA Assay (FDA approved for NASAL specimens only), is one component of a comprehensive MRSA colonization surveillance program. It is not intended  to diagnose MRSA infection nor to guide or monitor treatment for MRSA infections. Performed at [facility], [place] [address] [place], [place] [address]   Culture, blood (routine x 2) Call MD if unable to obtain prior to antibiotics being given     Status: None   Collection Time: 12/28/17 10:54 PM  Result Value Ref Range Status   Specimen Description BLOOD LEFT ARM  Final   Special Requests   Final    BOTTLES DRAWN AEROBIC AND ANAEROBIC Blood Culture results may not be optimal due to an inadequate volume of blood received in culture bottles   Culture   Final    NO GROWTH 5 DAYS Performed at [facility], [place] [address] [place], [place] [address]    Report Status 01/02/2018 FINAL  Final  Culture, blood (routine x 2) Call MD if unable to obtain prior to antibiotics being given     Status: None   Collection Time: 12/28/17 10:54 PM  Result Value Ref Range Status   Specimen Description BLOOD RIGHT HAND  Final   Special Requests   Final    BOTTLES DRAWN AEROBIC AND ANAEROBIC Blood Culture adequate volume   Culture   Final    NO GROWTH 5 DAYS Performed at [facility], [place] [address] [place],  [address]    Report Status 01/02/2018 FINAL  Final    Studies/Results: Dg Chest Port 1 View  Result Date: 01/03/2018 CLINICAL DATA:  Pleural effusion. EXAM: PORTABLE CHEST 1 VIEW COMPARISON:  Radiograph of January 02, 2018. FINDINGS: Stable cardiomediastinal silhouette. No pneumothorax or pleural effusion is noted. Stable right upper and lower lobe opacities are noted concerning for pneumonia or atelectasis. Small right pleural effusion is noted. Minimal left basilar subsegmental atelectasis is noted with pleural effusion. Bony thorax is unremarkable. IMPRESSION: Stable right lung opacities are noted concerning for pneumonia or atelectasis. Stable mild right pleural effusion is noted. Minimal left basilar subsegmental atelectasis is noted with minimal left pleural effusion.  Electronically Signed   By: Marijo Conception, M.D.   On: 01/03/2018 09:46   Dg Chest Port 1 View  Result Date: 01/02/2018 CLINICAL DATA:  Short of breath EXAM: PORTABLE CHEST 1 VIEW COMPARISON:  01/01/2018, 12/28/2017, 12/19/2017, CT chest 12/08/2017 FINDINGS: Similar appearance of small right pleural effusion and upper lobe right greater than left nodular foci of airspace disease. Slight increased atelectasis or infiltrate at the right base. Stable cardiomediastinal silhouette. No pneumothorax. IMPRESSION: 1.  No significant change in small right greater than left pleural effusions and right greater than left upper lobe nodules. Mild diffuse ground-glass opacity in the right upper lobe, no change compared to 01/01/2018, slight increased compared to 12/28/2017. Electronically Signed   By: Donavan Foil M.D.   On: 01/02/2018 19:57   Dg Chest Port 1 View  Result Date: 01/01/2018 CLINICAL DATA:  Cough. EXAM: PORTABLE CHEST 1 VIEW COMPARISON:  12/28/2017 FINDINGS: Lungs are adequately inflated demonstrate persistent minimal patchy opacification of the right mid to upper lung slightly improved with mild linear scarring/atelectasis over the left midlung. Worsening small right pleural effusion. No pneumothorax. Cardiomediastinal silhouette and remainder of the exam is unchanged. IMPRESSION: Minimal patchy opacification over the right mid to upper lung slightly improved with minimal linear scarring over the left midlung. Worsening small right pleural effusion. Electronically Signed   By: Marin Olp M.D.   On: 01/01/2018 13:57     Assessment/Plan: Lung nodules Non-TB mycobacterial infection Dyspnea Prostate CA  Total days of antibiotics: 5 cefoxitin, tigecycline, azithro  Await more info from lab (final May 25) Will ask for chest PT ID available as needed over w/e Will let his urologist know he is in hospital (scheduled prostate surgery?)        Bobby Rumpf MD, FACP Infectious  Diseases (pager) [phone] [email or web address] 01/03/2018, 11:39 AM  LOS: 6 days

## 2018-01-03 NOTE — Procedures (Signed)
PROCEDURE SUMMARY:  Very small right pleural effusion. Successful US guided right thoracentesis. Yielded 140 mL of hazy yellow fluid. Pt tolerated procedure well. No immediate complications.  Specimen was sent for labs. CXR ordered.  Ascencion Dike PA-C 01/03/2018 3:01 PM

## 2018-01-03 NOTE — Evaluation (Signed)
Occupational Therapy Evaluation Patient Details Name: [name] MRN: [id number] DOB: 09/29/1950 Today's Date: 01/03/2018    History of Present Illness Pt adm with SOB and found to have PNA. PMH - prostate CA, copd, cad, htn   Clinical Impression   Patient was very lethargic during session. Patient required extra time to perform tasks. Pt. Nurse stated that he has been lethargic most of the day. Patient has decreased I and safety with adls and mobiltiy. Acute ot to follow.    Follow Up Recommendations  Home health OT    Equipment Recommendations       Recommendations for Other Services       Precautions / Restrictions Restrictions Weight Bearing Restrictions: No      Mobility Bed Mobility Overal bed mobility: Needs Assistance Bed Mobility: Sit to Supine       Sit to supine: Min assist   General bed mobility comments: Pt sitting EOB  Transfers Overall transfer level: Needs assistance Equipment used: None Transfers: Sit to/from Stand Sit to Stand: Min assist         General transfer comment: Assist for safety    Balance Overall balance assessment: Needs assistance Sitting-balance support: No upper extremity supported;Feet supported Sitting balance-Leahy Scale: Good     Standing balance support: No upper extremity supported Standing balance-Leahy Scale: Fair                             ADL either performed or assessed with clinical judgement   ADL Overall ADL's : Needs assistance/impaired Eating/Feeding: Independent   Grooming: Wash/dry hands;Wash/dry face;Oral care;Supervision/safety;Sitting   Upper Body Bathing: Minimal assistance   Lower Body Bathing: Minimal assistance;Moderate assistance   Upper Body Dressing : Minimal assistance   Lower Body Dressing: Moderate assistance   Toilet Transfer: Minimal assistance   Toileting- Clothing Manipulation and Hygiene: Minimal assistance       Functional mobility during ADLs: Minimal  assistance       Vision Baseline Vision/History: Wears glasses Wears Glasses: At all times       Perception     Praxis      Pertinent Vitals/Pain Pain Assessment: 0-10 Pain Score: 6  Pain Location: ribs Pain Descriptors / Indicators: Aching Pain Intervention(s): Patient requesting pain meds-RN notified;RN gave pain meds during session     Hand Dominance     Extremity/Trunk Assessment Upper Extremity Assessment Upper Extremity Assessment: Generalized weakness   Lower Extremity Assessment Lower Extremity Assessment: Generalized weakness   Cervical / Trunk Assessment Cervical / Trunk Assessment: Kyphotic   Communication Communication Communication: Expressive difficulties(Pt mumbling and difficult to understand)   Cognition Arousal/Alertness: Lethargic Behavior During Therapy: Flat affect Overall Cognitive Status: No family/caregiver present to determine baseline cognitive functioning                                 General Comments: Patient lethargic during session, unsure of baseline   General Comments       Exercises     Shoulder Instructions      Home Living Family/patient expects to be discharged to:: Private residence Living Arrangements: Alone Available Help at Discharge: Friend(s);Available PRN/intermittently Type of Home: Apartment Home Access: Level entry     Home Layout: One level     Bathroom Shower/Tub: Occupational psychologist: Standard     Home Equipment: None  Prior Functioning/Environment Level of Independence: Independent                 OT Problem List: Decreased strength;Decreased activity tolerance;Pain      OT Treatment/Interventions: Self-care/ADL training;Therapeutic exercise;DME and/or AE instruction;Therapeutic activities    OT Goals(Current goals can be found in the care plan section) Acute Rehab OT Goals Patient Stated Goal: go home OT Goal Formulation: With patient Time For  Goal Achievement: 01/17/18 Potential to Achieve Goals: Good  OT Frequency: Min 2X/week   Barriers to D/C: Decreased caregiver support          Co-evaluation              AM-PAC PT "6 Clicks" Daily Activity     Outcome Measure Help from another person eating meals?: None Help from another person taking care of personal grooming?: A Little Help from another person toileting, which includes using toliet, bedpan, or urinal?: A Lot Help from another person bathing (including washing, rinsing, drying)?: A Lot Help from another person to put on and taking off regular upper body clothing?: A Little Help from another person to put on and taking off regular lower body clothing?: A Lot 6 Click Score: 16   End of Session Equipment Utilized During Treatment: Gait belt Nurse Communication: Patient requests pain meds  Activity Tolerance: Patient limited by fatigue;Patient limited by lethargy;Patient limited by pain Patient left: in bed;with call bell/phone within reach;with bed alarm set  OT Visit Diagnosis: Unsteadiness on feet (R26.81);Muscle weakness (generalized) (M62.81)                Time: 1310-1400 OT Time Calculation (min): 50 min Charges:  OT General Charges $OT Visit: 1 Visit OT Evaluation $OT Eval Low Complexity: 1 Low OT Treatments $Self Care/Home Management : 8-22 mins $Therapeutic Activity: 8-22 mins G-Codes:     6 clicks  Levon Boettcher 01/03/2018, 2:01 PM

## 2018-01-03 NOTE — Progress Notes (Signed)
Attempted CPT with patient, patient is currently OTF. Will attempt at a later time.

## 2018-01-03 NOTE — Progress Notes (Signed)
Patient ID: [name], male   DOB: [birth year], 67 y.o.   MRN: [id number]  PROGRESS NOTE    [name]  EHU:[id number] DOB: [birth year] DOA: 12/28/2017  PCP: Bernerd Limbo, MD   Brief Narrative:  67 year old male with past medical history of COPD, prostate cancer, smoker who presented to [facility] with worsening shortness of breath and cough. Chest x-ray showed developing infiltrates in the upper and lower lung zones, patient was started on antibiotics and referred for admission.   Assessment & Plan:   Principal Problem:   Lobar pneumonia ([place]) / Leukocytosis / NTM pulmonary infection - CXR showed stable nodular densities bilaterally.  No pneumothorax noted. New small right-sided pleural effusion. Patchy changes in the upper lobes bilaterally increased from the previous exam which may represent some early infiltrate.  - Was started on vanco and cefepime, stopped vanco 4/21, stopped cefepime 4/21 - Continue azithromycin 500 mg IV, cefoxitin 2 gm IV every 4 hours and tigecycline  - CXR repeated 4/24 - minimal patchy opacification over the right mid to upper lung slightly improved with minimal linear scarring over the left midlung. Worsening small right pleural effusion.  - Consulted pulmonary since pt work of breathing increased 4/25  Active Problems:   Lung nodules  - CT guided core lung biopsy of right nodule on 4/11 - inflammation and fibrosis with numerous plasma cells    Hyponatremia - Dehydration versus acute lung process - Sodium 128 - Pt refused labs this am - Check BMP in am    Anemia of chronic disease - Hgb stable     Moderate protein calorie malnutrition / Failure to thrive in adult  - In the context of chronic illness - Diet as tolerated     DVT prophylaxis: SCD's  Code Status: full code  Family Communication: no family at the bedside  Disposition Plan: home once resp status better     Consultants:   ID  PCCM  Procedures:   None  Antimicrobials:    Vanco 4/20 --> 4/21  Cefepime 4/20 --> 4/21  Azithro, cefoxitin, tigecycline 4/21 -->   Subjective: No overnight events.  Objective: Vitals:   01/03/18 0113 01/03/18 0502 01/03/18 0721 01/03/18 0919  BP:  134/72  (!) 134/94  Pulse:  82  93  Resp:  18    Temp:  97.9 F (36.6 C)    TempSrc:  Oral    SpO2: 93% 91% 90% 92%  Weight:  50.3 kg (110 lb 14.4 oz)    Height:        Intake/Output Summary (Last 24 hours) at 01/03/2018 1210 Last data filed at 01/03/2018 1015 Gross per 24 hour  Intake 1290 ml  Output 2100 ml  Net -810 ml   Filed Weights   01/01/18 0629 01/02/18 0500 01/03/18 0502  Weight: 49.9 kg (110 lb 1.6 oz) 51.1 kg (112 lb 9.6 oz) 50.3 kg (110 lb 14.4 oz)    Physical Exam  Constitutional: Appears well-developed and well-nourished. No distress.  CVS: RRR, S1/S2 + Pulmonary: coarse sounds, no wheezing  Abdominal: Soft. BS +,  no distension, tenderness, rebound or guarding.  Musculoskeletal: Normal range of motion. No edema and no tenderness.  Lymphadenopathy: No lymphadenopathy noted, cervical, inguinal. Neuro: Alert. No cranial nerve deficit. Skin: Skin is warm and dry.  Psychiatric: Normal mood and affect. Behavior, judgment, thought content normal.    Data Reviewed: I have personally reviewed following labs and imaging studies  CBC: Recent Labs  Lab 12/28/17 1440 12/29/17 0742 12/30/17 0527  12/31/17 0424  WBC 9.5 10.8* 11.1* 12.9*  HGB 10.6* 9.5* 9.9* 9.3*  HCT 30.5* 28.6* 28.9* 27.8*  MCV 76.3* 77.5* 77.1* 77.4*  PLT 454* 619* 665* 811*   Basic Metabolic Panel: Recent Labs  Lab 12/28/17 2020 12/28/17 2254 12/29/17 0742 12/31/17 0424 01/01/18 0747  NA 124* 123* 127* 127* 128*  K 4.6 4.4 4.3 4.6 4.8  CL 91* 90* 93* 99* 96*  CO2 22 21* 24 21* 24  GLUCOSE 112* 126* 101* 91 88  BUN 9 9 8 16 17   CREATININE 0.64 0.65 0.63 0.76 0.88  CALCIUM 8.5* 8.5* 8.6* 8.2* 8.5*   GFR: Estimated Creatinine Clearance: 58 mL/min (by C-G formula  based on SCr of 0.88 mg/dL). Liver Function Tests: No results for input(s): AST, ALT, ALKPHOS, BILITOT, PROT, ALBUMIN in the last 168 hours. No results for input(s): LIPASE, AMYLASE in the last 168 hours. No results for input(s): AMMONIA in the last 168 hours. Coagulation Profile: No results for input(s): INR, PROTIME in the last 168 hours. Cardiac Enzymes: No results for input(s): CKTOTAL, CKMB, CKMBINDEX, TROPONINI in the last 168 hours. BNP (last 3 results) No results for input(s): PROBNP in the last 8760 hours. HbA1C: No results for input(s): HGBA1C in the last 72 hours. CBG: No results for input(s): GLUCAP in the last 168 hours. Lipid Profile: No results for input(s): CHOL, HDL, LDLCALC, TRIG, CHOLHDL, LDLDIRECT in the last 72 hours. Thyroid Function Tests: No results for input(s): TSH, T4TOTAL, FREET4, T3FREE, THYROIDAB in the last 72 hours. Anemia Panel: No results for input(s): VITAMINB12, FOLATE, FERRITIN, TIBC, IRON, RETICCTPCT in the last 72 hours. Urine analysis: No results found for: COLORURINE, APPEARANCEUR, LABSPEC, PHURINE, GLUCOSEU, HGBUR, BILIRUBINUR, KETONESUR, PROTEINUR, UROBILINOGEN, NITRITE, LEUKOCYTESUR Sepsis Labs: @LABRCNTIP (procalcitonin:4,lacticidven:4)   Acid Fast Smear (AFB)     Status: None   Collection Time: 12/19/17  5:29 PM  Result Value Ref Range Status   AFB Specimen Processing Concentration  Final   Acid Fast Smear Negative  Final    Comment: (NOTE) Performed At: [facility] [place], [place] [id number] Rush Farmer MD OZ:[id number]    Source (AFB) TISSUE  Final    Comment: RIGHT UPPER LUNG   Culture, fungus without smear     Status: None (Preliminary result)   Collection Time: 12/19/17  5:29 PM  Result Value Ref Range Status   Specimen Description TISSUE RIGHT UPPER LUNG  Final   Special Requests NONE  Final   Culture   Final    NO FUNGUS ISOLATED AFTER 7 DAYS Performed at [facility], [address], [place], [place] [address]    Report Status PENDING  Incomplete  Aerobic/Anaerobic Culture (surgical/deep wound)     Status: None   Collection Time: 12/19/17  5:29 PM  Result Value Ref Range Status   Specimen Description TISSUE RIGHT UPPER LUNG  Final   Special Requests NONE  Final   Gram Stain   Final    FEW WBC PRESENT,BOTH PMN AND MONONUCLEAR NO ORGANISMS SEEN    Culture   Final    No growth aerobically or anaerobically. Performed at [facility], [place] [address] [place],  [address]    Report Status 12/24/2017 FINAL  Final  MRSA PCR Screening     Status: None   Collection Time: 12/28/17 10:46 PM  Result Value Ref Range Status   MRSA by PCR NEGATIVE NEGATIVE Final      Radiology Studies: Dg Chest 2 View Result  Date: 12/28/2017 Stable nodular densities bilaterally.  No pneumothorax is noted. New small right-sided pleural effusion. Patchy changes in the upper lobes bilaterally increased from the previous exam which may represent some early infiltrate.     Scheduled Meds: . aspirin  81 mg Oral Daily  . enoxaparin   40 mg Subcutaneous Q24H   Continuous Infusions: . azithromycin Stopped (01/02/18 1450)  . cefOXitin Stopped (01/03/18 0631)  . tigecycline (TYGACIL) IVPB Stopped (01/03/18 0630)     LOS: 6 days    Time spent: 25 minutes  Greater than 50% of the time spent on counseling and coordinating the care.   Leisa Lenz, MD [facility] Pager [phone]  If 7PM-7AM, please contact night-coverage [email or web address] Password [email or web address] 01/03/2018, 12:10 PM

## 2018-01-04 ENCOUNTER — Inpatient Hospital Stay (HOSPITAL_COMMUNITY): Payer: Medicare Other

## 2018-01-04 DIAGNOSIS — R0902 Hypoxemia: Secondary | ICD-10-CM

## 2018-01-04 DIAGNOSIS — J9811 Atelectasis: Secondary | ICD-10-CM

## 2018-01-04 LAB — CBC
HEMATOCRIT: 34.6 % — AB (ref 39.0–52.0)
Hemoglobin: 11.5 g/dL — ABNORMAL LOW (ref 13.0–17.0)
MCH: 26 pg (ref 26.0–34.0)
MCHC: 33.2 g/dL (ref 30.0–36.0)
MCV: 78.1 fL (ref 78.0–100.0)
Platelets: 635 10*3/uL — ABNORMAL HIGH (ref 150–400)
RBC: 4.43 MIL/uL (ref 4.22–5.81)
RDW: 13.8 % (ref 11.5–15.5)
WBC: 12.6 10*3/uL — ABNORMAL HIGH (ref 4.0–10.5)

## 2018-01-04 LAB — BASIC METABOLIC PANEL
Anion gap: 9 (ref 5–15)
BUN: 26 mg/dL — ABNORMAL HIGH (ref 6–20)
CO2: 24 mmol/L (ref 22–32)
Calcium: 8.2 mg/dL — ABNORMAL LOW (ref 8.9–10.3)
Chloride: 97 mmol/L — ABNORMAL LOW (ref 101–111)
Creatinine, Ser: 1.07 mg/dL (ref 0.61–1.24)
GLUCOSE: 89 mg/dL (ref 65–99)
POTASSIUM: 4.7 mmol/L (ref 3.5–5.1)
Sodium: 130 mmol/L — ABNORMAL LOW (ref 135–145)

## 2018-01-04 LAB — CREATININE, SERUM
Creatinine, Ser: 1.14 mg/dL (ref 0.61–1.24)
GFR calc Af Amer: >60 mL/min (ref >60)
GFR calc non Af Amer: >60 mL/min (ref >60)

## 2018-01-04 MED ORDER — KETOROLAC TROMETHAMINE 30 MG/ML IJ SOLN
15.0000 mg | Freq: Once | INTRAMUSCULAR | Status: AC
  Administered 2018-01-04: 15 mg via INTRAVENOUS
  Filled 2018-01-04: qty 1

## 2018-01-04 MED ORDER — IPRATROPIUM-ALBUTEROL 0.5-2.5 (3) MG/3ML IN SOLN
3.0000 mL | Freq: Four times a day (QID) | RESPIRATORY_TRACT | Status: DC
  Administered 2018-01-05 (×4): 3 mL via RESPIRATORY_TRACT
  Filled 2018-01-04 (×4): qty 3

## 2018-01-04 MED ORDER — ENOXAPARIN SODIUM 40 MG/0.4ML ~~LOC~~ SOLN
40.0000 mg | SUBCUTANEOUS | Status: DC
  Administered 2018-01-04 – 2018-01-05 (×2): 40 mg via SUBCUTANEOUS
  Filled 2018-01-04 (×3): qty 0.4

## 2018-01-04 MED ORDER — IPRATROPIUM-ALBUTEROL 0.5-2.5 (3) MG/3ML IN SOLN
3.0000 mL | Freq: Four times a day (QID) | RESPIRATORY_TRACT | Status: DC
  Administered 2018-01-04 (×2): 3 mL via RESPIRATORY_TRACT
  Filled 2018-01-04 (×2): qty 3

## 2018-01-04 MED ORDER — BENZONATATE 100 MG PO CAPS
200.0000 mg | ORAL_CAPSULE | Freq: Two times a day (BID) | ORAL | Status: DC | PRN
  Filled 2018-01-04 (×2): qty 2

## 2018-01-04 MED ORDER — METHYLPREDNISOLONE SODIUM SUCC 125 MG IJ SOLR
60.0000 mg | INTRAMUSCULAR | Status: DC
  Administered 2018-01-04 – 2018-01-05 (×2): 60 mg via INTRAVENOUS
  Filled 2018-01-04 (×2): qty 2

## 2018-01-04 MED ORDER — GUAIFENESIN ER 600 MG PO TB12
600.0000 mg | ORAL_TABLET | Freq: Two times a day (BID) | ORAL | Status: DC
  Administered 2018-01-04 – 2018-01-08 (×7): 600 mg via ORAL
  Filled 2018-01-04 (×10): qty 1

## 2018-01-04 NOTE — Progress Notes (Signed)
Pt has had poor PO intake today and yesterday. Pt encouraged to eat. Pt states he has no appetite.

## 2018-01-04 NOTE — Progress Notes (Signed)
Pts daughter [name] called to speak with RN regarding father.  Pt was asked to hold while permission was obtained from the pt per hospital protocol before his information was shared.  Pt was upset that this was part of the process.  Pt also upset that other family members had information regarding her father.  Pt informed that if the pt consents to information being shared, that is his right.  Pt became even further upset and was speaking loudly on the phone and was not willing to hear the hospitals process or confidentiality rules.  Pt directed to the charge RN who reinforced hospital protocols.  Pt was not satisfied with information given and came to hospital asking to see the Charge RN.  Agricultural consultant and pt RN went into pt room to speak with his daughter [name].  Both RN's offered/suggested for the pt to set up a passcode if he would like for added security.  Pt declined.  Conversation with pts daughter was very disorganized and tangential.  [name] also had concerns about her fathers treatment and was instructed to come in the early morning so that she could speak with the patients MD for more detailed information if she desired.  Pt continued speaking loudly and there was no resolution for the patient even after all questions were answered and her father made  The decision not to set up a passcode.  [name] continued to speak loudly and was asked to please consider her father and the possible stress that he may be experiencing as a result of her being upset.  Daughter began to gather her belongings and as RN's were leaving out of pt room, [name] was giving the pt his wallet.  RN explained that the hospital is not responsible for pt belongings and pt was asked to decide if he wanted his wallet left with him or returned home with his daughter.  About 10 minutes after Jessica left, the pt yelled out from his room and said he was in pain and began to cough.  Pt assessed, medications administered and pt was  reassured by staff.  Pt vitals stable and pt repositioned for comfort.  Will continue to monitor for safety.

## 2018-01-04 NOTE — Progress Notes (Addendum)
Pt refused to wear SCD's. Pt educated on importance. Midlevel provider text paged that pt has been refusing.

## 2018-01-04 NOTE — Progress Notes (Signed)
PROGRESS NOTE  [name] ZDG:[id number] DOB: 12-Dec-1950 DOA: 12/28/2017 PCP: Bernerd Limbo, MD  Briefy Summary:  67 year old ex-smoker with a history of prostate cancer with multiple pulmonary nodules, CT-guided needle biopsy 4/11 showing inflammation with numerous plasma cells and ultimately return cells, unfortunately afb stains not performed on the specimen.  AFB culture from 4/9 growing with DNA probe negative for MTB source of nontuberculous mycobacteria. He now has a small pleural effusion and coarse breath sounds, is being followed by ID and treatment started for NTM. s/p thoracentesis on 4/27  ID and pulmonology following   HPI/Recap of past 24 hours:  He appear very weak, he does not appear to have good insight in the disease process On physical exam there is diffuse wheezing, he denies wheezing, he denies sob at rest He is currently on room air, he has mild edema in his legs He denies pain, no fever There is no cough during my encounter He is not able to provide much history  Assessment/Plan: Principal Problem:   Lobar pneumonia ([place]) Active Problems:   G E R D   Lung nodules   Hyponatremia   Mycobacterial disease   Pleural effusion   Lobar pneumonia ([facility]) / Leukocytosis / NTM pulmonary infection - CXR showed stable nodular densities bilaterally.  No pneumothorax noted. New small right-sided pleural effusion. Patchy changes in the upper lobes bilaterally increased from the previous exam which may represent some early infiltrate.  - Was started on vanco and cefepime, stopped vanco 4/21, stopped cefepime 4/21 - Continue azithromycin 500 mg IV, cefoxitin 2 gm IV every 4 hours and tigecycline per infectious disease recomemendation - CXR repeated 4/24 - minimal patchy opacification over the right mid to upper lung slightly improved with minimal linear scarring over the left midlung. Worsening small right pleural effusion.  - Consulted pulmonary since pt work of  breathing increased 4/25 -s/p thoracentesis on 4/26 with only 140cc removed, culture no growth so far, acid fast smear in process, cytology in process -diffuse wheezing, increase nebs, add steroid, also has mild edema, fluids overload? Will get echo, prn lasix  Active Problems:   Lung nodules  - CT guided core lung biopsy of right nodule on 4/11 - inflammation and fibrosis with numerous plasma cells    Hyponatremia - Dehydration versus acute lung process ([place]) - Sodium 123 on presentation, sodium 130 today -  Check BMP in am, now he appear to have some mild edema, echo pending    Anemia of chronic disease - Hgb stable     Moderate protein calorie malnutrition / Failure to thrive in adult  Body mass index is 19.04 kg/m. - In the context of chronic illness - Diet as tolerated   prostate cancer, followed by urology Dr Tresa Moore and radiation oncology dr Tammi Klippel  FTT; very weak, will get Pt/OT, home health vs SNF  DVT prophylaxis: SCD's  Code Status: full code  Family Communication: no family at the bedside  Disposition Plan: not ready to discharge,    Consultants:   ID  PCCM  Procedures:   None  Antimicrobials:   Vanco 4/20 --> 4/21  Cefepime 4/20 --> 4/21  Azithro, cefoxitin, tigecycline 4/21 -->     Objective: BP 133/88   Pulse 85   Temp 99.3 F (37.4 C) (Oral)   Resp (!) 22   Ht 5\' 4"  (1.626 m)   Wt 50.3 kg (110 lb 14.4 oz)   SpO2 92%   BMI 19.04 kg/m   Intake/Output Summary (  Last 24 hours) at 01/04/2018 1351 Last data filed at 01/04/2018 0900 Gross per 24 hour  Intake 223 ml  Output 500 ml  Net -277 ml   Filed Weights   01/01/18 0629 01/02/18 0500 01/03/18 0502  Weight: 49.9 kg (110 lb 1.6 oz) 51.1 kg (112 lb 9.6 oz) 50.3 kg (110 lb 14.4 oz)    Exam: Patient is examined daily including today on 01/04/2018, exams remain the same as of yesterday except that has changed    General:  Frail, thin, very weak  Cardiovascular:  RRR  Respiratory: diffuse wheezing bilaterally  Abdomen: Soft/ND/NT, positive BS  Musculoskeletal: tace bilateral lower extremity pitting Edema  Neuro: alert, oriented to place and year, not to month   Data Reviewed: Basic Metabolic Panel: Recent Labs  Lab 12/28/17 2254 12/29/17 0742 12/31/17 0424 01/01/18 0747 01/04/18 0428  NA 123* 127* 127* 128* 130*  K 4.4 4.3 4.6 4.8 4.7  CL 90* 93* 99* 96* 97*  CO2 21* 24 21* 24 24  GLUCOSE 126* 101* 91 88 89  BUN 9 8 16 17  26*  CREATININE 0.65 0.63 0.76 0.88 1.07  CALCIUM 8.5* 8.6* 8.2* 8.5* 8.2*   Liver Function Tests: No results for input(s): AST, ALT, ALKPHOS, BILITOT, PROT, ALBUMIN in the last 168 hours. No results for input(s): LIPASE, AMYLASE in the last 168 hours. No results for input(s): AMMONIA in the last 168 hours. CBC: Recent Labs  Lab 12/28/17 1440 12/29/17 0742 12/30/17 0527 12/31/17 0424  WBC 9.5 10.8* 11.1* 12.9*  HGB 10.6* 9.5* 9.9* 9.3*  HCT 30.5* 28.6* 28.9* 27.8*  MCV 76.3* 77.5* 77.1* 77.4*  PLT 454* 619* 665* 611*   Cardiac Enzymes:   No results for input(s): CKTOTAL, CKMB, CKMBINDEX, TROPONINI in the last 168 hours. BNP (last 3 results) No results for input(s): BNP in the last 8760 hours.  ProBNP (last 3 results) No results for input(s): PROBNP in the last 8760 hours.  CBG: No results for input(s): GLUCAP in the last 168 hours.  Recent Results (from the past 240 hour(s))  MRSA PCR Screening     Status: None   Collection Time: 12/28/17 10:46 PM  Result Value Ref Range Status   MRSA by PCR NEGATIVE NEGATIVE Final    Comment:        The GeneXpert MRSA Assay (FDA approved for NASAL specimens only), is one component of a comprehensive MRSA colonization surveillance program. It is not intended to diagnose MRSA infection nor to guide or monitor treatment for MRSA infections. Performed at [facility], [place] [address], [place], [place] [address]   Culture, blood (routine x 2) Call MD  if unable to obtain prior to antibiotics being given     Status: None   Collection Time: 12/28/17 10:54 PM  Result Value Ref Range Status   Specimen Description BLOOD LEFT ARM  Final   Special Requests   Final    BOTTLES DRAWN AEROBIC AND ANAEROBIC Blood Culture results may not be optimal due to an inadequate volume of blood received in culture bottles   Culture   Final    NO GROWTH 5 DAYS Performed at [facility], [place] [address], [place], [place] [address]    Report Status 01/02/2018 FINAL  Final  Culture, blood (routine x 2) Call MD if unable to obtain prior to antibiotics being given     Status: None   Collection Time: 12/28/17 10:54 PM  Result Value Ref Range Status   Specimen  Description BLOOD RIGHT HAND  Final   Special Requests   Final    BOTTLES DRAWN AEROBIC AND ANAEROBIC Blood Culture adequate volume   Culture   Final    NO GROWTH 5 DAYS Performed at [facility], [address], [place], [place] [address]    Report Status 01/02/2018 FINAL  Final  Culture, body fluid-bottle     Status: None (Preliminary result)   Collection Time: 01/03/18  3:04 PM  Result Value Ref Range Status   Specimen Description PLEURAL RIGHT  Final   Special Requests NONE  Final   Culture   Final    NO GROWTH < 24 HOURS Performed at [facility], [place] [address], [place], [place] [address]    Report Status PENDING  Incomplete     Studies: Dg Chest 1 View  Result Date: 01/03/2018 CLINICAL DATA:  Post thoracentesis. EXAM: CHEST  1 VIEW COMPARISON:  Chest radiograph earlier today. FINDINGS: RIGHT pleural effusion is decreased. There is no pneumothorax. Stable cardiac silhouette and BILATERAL pulmonary opacities. IMPRESSION: Improved RIGHT pleural effusion post thoracentesis. Electronically Signed   By: Staci Righter M.D.   On: 01/03/2018 15:10   Dg Chest Port 1 View  Result Date: 01/04/2018 CLINICAL DATA:  Evaluate for pleural effusion. EXAM: PORTABLE CHEST 1 VIEW COMPARISON:   Chest radiograph 01/03/2018. FINDINGS: Monitoring leads overlie the patient. Stable enlarged cardiac and mediastinal contours. Minimal left basilar atelectasis. Persistent moderate right pleural effusion. Interval increase in consolidation throughout the right mid and upper lung. IMPRESSION: Persistent moderate right pleural effusion. Increasing consolidation throughout the mid and upper right lung, potentially secondary to infection. Electronically Signed   By: Lovey Newcomer M.D.   On: 01/04/2018 12:43   Ir Thoracentesis Asp Pleural Space W/img Guide  Result Date: 01/03/2018 INDICATION: History pulmonary nodules. Status post image guided biopsy of right lung nodule on 12/19/2017. Patient now with small right-sided pleural effusion. Request for diagnostic and therapeutic thoracentesis. EXAM: ULTRASOUND GUIDED RIGHT THORACENTESIS MEDICATIONS: None. COMPLICATIONS: None immediate. Postprocedural chest x-ray negative for pneumothorax. PROCEDURE: An ultrasound guided thoracentesis was thoroughly discussed with the patient and questions answered. The benefits, risks, alternatives and complications were also discussed. The patient understands and wishes to proceed with the procedure. Written consent was obtained. Ultrasound was performed to localize and mark an very small but adequate pocket of fluid in the right chest. The area was then prepped and draped in the normal sterile fashion. 1% Lidocaine was used for local anesthesia. Under ultrasound guidance a 6 Fr Safe-T-Centesis catheter was introduced. Thoracentesis was performed. The catheter was removed and a dressing applied. FINDINGS: A total of approximately 140 mL of hazy yellow fluid was removed. Samples were sent to the laboratory as requested by the clinical team. IMPRESSION: Successful ultrasound guided right thoracentesis yielding 140 mL of pleural fluid. Read by: Ascencion Dike PA-C No pneumothorax on follow-up radiography. Electronically Signed   By: Lucrezia Europe M.D.   On: 01/03/2018 15:16    Scheduled Meds: . aspirin  81 mg Oral Daily  . benzonatate  200 mg Oral TID  . chlorpheniramine-HYDROcodone  5 mL Oral Q12H  . diltiazem  30 mg Oral Q8H  . ipratropium-albuterol  3 mL Nebulization BID  . metoprolol tartrate  25 mg Oral BID  . ondansetron (ZOFRAN) IV  4 mg Intravenous Q12H  . sodium chloride flush  3 mL Intravenous Q12H    Continuous Infusions: . azithromycin Stopped (01/03/18 1700)  . cefOXitin 3 g (  01/04/18 1329)  . tigecycline (TYGACIL) IVPB Stopped (01/04/18 0622)     Time spent: 63mins I have personally reviewed and interpreted on  01/04/2018 daily labs, tele strips, imagings as discussed above under date review session and assessment and plans.  I reviewed all nursing notes, pharmacy notes, consultant notes,  vitals, pertinent old records  I have discussed plan of care as described above with RN , patient on 01/04/2018   Florencia Reasons MD, PhD  [facility] Pager [phone]. If 7PM-7AM, please contact night-coverage at [email or web address], password [email or web address] 01/04/2018, 1:51 PM  LOS: 7 days

## 2018-01-04 NOTE — Progress Notes (Signed)
[facility]  PULMONARY / CRITICAL CARE MEDICINE   Name: [name] MRN: [id number] DOB: 03-27-1951      ADMISSION DATE:  12/28/2017 CONSULTATION::Internal medicine  CHIEF COMPLAINT: persistent cough,sob, weight loss  HISTORY OF PRESENT ILLNESS:    This is a 67 year old male with history of coronary artery disease, asthma, who presented to the emergency room about a week ago for URI type symptoms.  He was diagnosed with pneumonia, he was given doxycycline to take for 7 days.  Prior to discharge from the ED he underwent sputum cultures.  EDP at that time discussed with infectious disease over the phone.  Patient went home, and continued to feel sick it despite antibiotics and decided to come back to the hospital.  He is experiencing progressive shortness of breath as well as an unrelenting cough which is occasionally productive but mostly dry.  Sputum cultures from prior ED visit showed pseudohyphae and yeast.  He has not followed consistently with primary care.  He was diagnosed with prostate cancer apparently 1 year ago but it held off on therapy.  He did have a PET scan done in December which did not show evidence of any malignancy outside the prostate itself.   In early April, one of 3 specimnes found to be AFB+ at 2 wk mark from sputum specimen. He was seen by ID on 4/16 where he still had productive cough, wheezing, right sided pleuretic chest pain. Also reported having malaise, fatigue, roughly 6-7 lb/in 1 month of weight loss. At that time,none of his cultures had isolated anything except psuedohyphae on one culture thought to be colonizer.   He was readmitted to the hospital on 4/20 for worsening,labored cough, unable to sleep. One of his cx came back yesterday for +AFb but mTB PCR negative. Other NTM PCR testing is pending. He reports this morning unable to produce phlegm, felt it was stuck. His daughter is at his bedside  He also has no evidence of other unusual zoonotic exposures such as  exposure to farm animals or pets or birds.  He was incarcerated many decades ago in jail for some period of time but other than that was never else in jail.  He has never traveled outside the [place] and is hardly traveled much in the [place] having grown up in [place] and lived in [place].  He has not been exposed to anyone who has tuberculosis.  He has not known to have an autoimmune disease.   4/25 We are asked as patient RA sats dropped from 97 to 93%. Patient suggest cough. But denies any other complain except pain with coughing. CXR showed some Right sided effusion   Total days of antibiotics: cefoxitin, tigecycline, azithro   Intake/Output Summary (Last 24 hours) at 01/04/2018 1442 Last data filed at 01/04/2018 0900 Gross per 24 hour  Intake 223 ml  Output 500 ml  Net -277 ml   Recent Labs  Lab 12/31/17 0424 01/01/18 0747 01/04/18 0428  NA 127* 128* 130*   Recent Labs  Lab 12/31/17 0424 01/01/18 0747 01/04/18 0428  K 4.6 4.8 4.7   Filed Weights   01/01/18 0629 01/02/18 0500 01/03/18 0502  Weight: 110 lb 1.6 oz (49.9 kg) 112 lb 9.6 oz (51.1 kg) 110 lb 14.4 oz (50.3 kg)   Lung nodules  - CT guided core lung biopsy of right nodule on 4/11 - inflammation and fibrosis with numerous plasma cells  VITAL SIGNS: BP 133/88   Pulse 85   Temp  99.3 F (37.4 C) (Oral)   Resp (!) 22   Ht 5\' 4"  (1.626 m)   Wt 110 lb 14.4 oz (50.3 kg)   SpO2 92%   BMI 19.04 kg/m    INTAKE / OUTPUT: I/O last 3 completed shifts: In: 458 [P.O.:540; I.V.:3; IV Piggyback:300] Out: 2450 [Urine:2450]  PHYSICAL EXAMINATION: General:  Comfortable in bed, NAD Neuro:  Alert and oriented, moving all ext to command HEENT: [place]/AT, PERRL, EOM-I and MMM Cardiovascular:  RRR, Nl S1/S2 and -M/R/G. Lungs: CTA bilaterally Abdomen:  Soft, NT, ND and +BS Extr: -edema and -tenderness  LABS:  BMET Recent Labs  Lab 12/31/17 0424 01/01/18 0747 01/04/18 0428  NA 127*  128* 130*  K 4.6 4.8 4.7  CL 99* 96* 97*  CO2 21* 24 24  BUN 16 17 26*  CREATININE 0.76 0.88 1.07  GLUCOSE 91 88 89   Electrolytes Recent Labs  Lab 12/31/17 0424 01/01/18 0747 01/04/18 0428  CALCIUM 8.2* 8.5* 8.2*   CBC Recent Labs  Lab 12/29/17 0742 12/30/17 0527 12/31/17 0424  WBC 10.8* 11.1* 12.9*  HGB 9.5* 9.9* 9.3*  HCT 28.6* 28.9* 27.8*  PLT 619* 665* 611*    Coag's No results for input(s): APTT, INR in the last 168 hours.  Sepsis Markers Recent Labs  Lab 12/28/17 1838  LATICACIDVEN 0.96    ABG No results for input(s): PHART, PCO2ART, PO2ART in the last 168 hours.  Liver Enzymes No results for input(s): AST, ALT, ALKPHOS, BILITOT, ALBUMIN in the last 168 hours.  Cardiac Enzymes No results for input(s): TROPONINI, PROBNP in the last 168 hours.  Glucose No results for input(s): GLUCAP in the last 168 hours.  Imaging Dg Chest 1 View  Result Date: 01/03/2018 CLINICAL DATA:  Post thoracentesis. EXAM: CHEST  1 VIEW COMPARISON:  Chest radiograph earlier today. FINDINGS: RIGHT pleural effusion is decreased. There is no pneumothorax. Stable cardiac silhouette and BILATERAL pulmonary opacities. IMPRESSION: Improved RIGHT pleural effusion post thoracentesis. Electronically Signed   By: Staci Righter M.D.   On: 01/03/2018 15:10   Dg Chest Port 1 View  Result Date: 01/04/2018 CLINICAL DATA:  Evaluate for pleural effusion. EXAM: PORTABLE CHEST 1 VIEW COMPARISON:  Chest radiograph 01/03/2018. FINDINGS: Monitoring leads overlie the patient. Stable enlarged cardiac and mediastinal contours. Minimal left basilar atelectasis. Persistent moderate right pleural effusion. Interval increase in consolidation throughout the right mid and upper lung. IMPRESSION: Persistent moderate right pleural effusion. Increasing consolidation throughout the mid and upper right lung, potentially secondary to infection. Electronically Signed   By: Lovey Newcomer M.D.   On: 01/04/2018 12:43   Ir  Thoracentesis Asp Pleural Space W/img Guide  Result Date: 01/03/2018 INDICATION: History pulmonary nodules. Status post image guided biopsy of right lung nodule on 12/19/2017. Patient now with small right-sided pleural effusion. Request for diagnostic and therapeutic thoracentesis. EXAM: ULTRASOUND GUIDED RIGHT THORACENTESIS MEDICATIONS: None. COMPLICATIONS: None immediate. Postprocedural chest x-ray negative for pneumothorax. PROCEDURE: An ultrasound guided thoracentesis was thoroughly discussed with the patient and questions answered. The benefits, risks, alternatives and complications were also discussed. The patient understands and wishes to proceed with the procedure. Written consent was obtained. Ultrasound was performed to localize and mark an very small but adequate pocket of fluid in the right chest. The area was then prepped and draped in the normal sterile fashion. 1% Lidocaine was used for local anesthesia. Under ultrasound guidance a 6 Fr Safe-T-Centesis catheter was introduced. Thoracentesis was performed. The catheter was removed and a dressing applied. FINDINGS:  A total of approximately 140 mL of hazy yellow fluid was removed. Samples were sent to the laboratory as requested by the clinical team. IMPRESSION: Successful ultrasound guided right thoracentesis yielding 140 mL of pleural fluid. Read by: Ascencion Dike PA-C No pneumothorax on follow-up radiography. Electronically Signed   By: Lucrezia Europe M.D.   On: 01/03/2018 15:16   CULTURES: AFB smears x 2 negative AFB culture pending Expectorated sputum 4/9 few GPCS, few WBCS  ANTIBIOTICS: Azithromycin  I reviewed CXR myself, pleural effusion bilaterally, noted.   Assessment and Plan:  67 year old male with NTM infection and pleural effusion.  Thora done on 4/26.  Discussed with PCCM-NP.  Pleural effusion: exudative but no lymph predominance making NTM related effusion less likely but smear is pending.  - F/u on smear.  - Keep fluid  even  - F/U radiographically  NTM infection:  - Zithromax  - ID following  - F/U on AFB  Hypoxemia:  - Titrate O2 for sat of 88-92%  Atelectasis:  - IS per RT protocol  - Flutter valve  PCCM will sign off, please call back if needed  Rush Farmer, M.D. [facility] Pulmonary/Critical Care Medicine. Pager: [phone]. After hours pager: [phone].  01/04/2018, 2:42 PM

## 2018-01-04 NOTE — Progress Notes (Signed)
02 placed on pt.  Pt removes oxygen.  Will continue to educate.

## 2018-01-05 ENCOUNTER — Inpatient Hospital Stay (HOSPITAL_COMMUNITY): Payer: Medicare Other

## 2018-01-05 DIAGNOSIS — I340 Nonrheumatic mitral (valve) insufficiency: Secondary | ICD-10-CM

## 2018-01-05 LAB — BASIC METABOLIC PANEL
ANION GAP: 11 (ref 5–15)
BUN: 33 mg/dL — ABNORMAL HIGH (ref 6–20)
CALCIUM: 8.4 mg/dL — AB (ref 8.9–10.3)
CO2: 26 mmol/L (ref 22–32)
CREATININE: 1.8 mg/dL — AB (ref 0.61–1.24)
Chloride: 95 mmol/L — ABNORMAL LOW (ref 101–111)
GFR, EST AFRICAN AMERICAN: 43 mL/min — AB (ref >60)
GFR, EST NON AFRICAN AMERICAN: 37 mL/min — AB (ref >60)
Glucose, Bld: 130 mg/dL — ABNORMAL HIGH (ref 65–99)
Potassium: 4.9 mmol/L (ref 3.5–5.1)
SODIUM: 132 mmol/L — AB (ref 135–145)

## 2018-01-05 LAB — CBC
HCT: 35.5 % — ABNORMAL LOW (ref 39.0–52.0)
Hemoglobin: 12.3 g/dL — ABNORMAL LOW (ref 13.0–17.0)
MCH: 25.6 pg — AB (ref 26.0–34.0)
MCHC: 34.6 g/dL (ref 30.0–36.0)
MCV: 73.8 fL — ABNORMAL LOW (ref 78.0–100.0)
PLATELETS: 591 10*3/uL — AB (ref 150–400)
RBC: 4.81 MIL/uL (ref 4.22–5.81)
RDW: 13 % (ref 11.5–15.5)
WBC: 11.5 10*3/uL — ABNORMAL HIGH (ref 4.0–10.5)

## 2018-01-05 LAB — ECHOCARDIOGRAM LIMITED
HEIGHTINCHES: 64 in
WEIGHTICAEL: 1936.52 [oz_av]

## 2018-01-05 LAB — MAGNESIUM: MAGNESIUM: 2 mg/dL (ref 1.7–2.4)

## 2018-01-05 MED ORDER — IPRATROPIUM-ALBUTEROL 0.5-2.5 (3) MG/3ML IN SOLN
3.0000 mL | Freq: Two times a day (BID) | RESPIRATORY_TRACT | Status: DC
  Administered 2018-01-06 – 2018-01-09 (×7): 3 mL via RESPIRATORY_TRACT
  Filled 2018-01-05 (×7): qty 3

## 2018-01-05 MED ORDER — ORAL CARE MOUTH RINSE
15.0000 mL | Freq: Two times a day (BID) | OROMUCOSAL | Status: DC
  Administered 2018-01-05 – 2018-01-15 (×15): 15 mL via OROMUCOSAL

## 2018-01-05 NOTE — Progress Notes (Signed)
Patient ID: [name], male   DOB: 04/10/1951, 67 y.o.   MRN: [id number]  PROGRESS NOTE    [name]  QZE:[id number] DOB: Aug 22, 1951 DOA: 12/28/2017  PCP: Bernerd Limbo, MD   Brief Narrative:  67 year old male with past medical history of COPD, prostate cancer, smoker who presented to [facility] with worsening shortness of breath and cough. Chest x-ray showed developing infiltrates in the upper and lower lung zones, patient was started on antibiotics and referred for admission.   Assessment & Plan:   Principal Problem:   Lobar pneumonia ([place]) / Leukocytosis / NTM pulmonary infection - CXR showed stable nodular densities bilaterally.  No pneumothorax noted. New small right-sided pleural effusion. Patchy changes in the upper lobes bilaterally increased from the previous exam which may represent some early infiltrate.  - Was started on vanco and cefepime, stopped vanco 4/21, stopped cefepime 4/21 per ID and switched to azithromycin 500 mg IV, cefoxitin 2 gm IV every 4 hours and tigecycline  - CXR repeated 4/24 - minimal patchy opacification over the right mid to upper lung slightly improved with minimal linear scarring over the left midlung. Worsening small right pleural effusion.  - Consulted pulmonary since pt work of breathing increased 4/25 - Pt is status post thoracentesis 4/26 - pleural effusion is exudative but no lymph predominance. Smear is pending    Active Problems:   Lung nodules  - CT guided core lung biopsy of right nodule on 4/11 - inflammation and fibrosis with numerous plasma cells    AKI - Unclear etiology although possibility includes vanco - Pt no longer on vanco - Follow up BMP in am     Hyponatremia - Dehydration versus acute lung process - Sodium 132    Anemia of chronic disease - Hgb stable     Moderate protein calorie malnutrition / Failure to thrive in adult  - In the context of chronic illness - Diet as tolerated     DVT prophylaxis: SCD's Code  Status: full code  Family Communication: few family members at the bedside this am Disposition Plan: home once resp status better   Consultants:   ID  PCCM  Procedures:   Thoracentesis 4/26  Antimicrobials:   Vanco 4/20 --> 4/21  Cefepime 4/20 --> 4/21  Azithro, cefoxitin, tigecycline 4/21 -->   Subjective: No overnight events.  Objective: Vitals:   01/05/18 0557 01/05/18 0746 01/05/18 0817 01/05/18 1228  BP:  (!) 153/97    Pulse:  84    Resp:      Temp:      TempSrc:      SpO2:  96% 96% 99%  Weight: 54.9 kg (121 lb 0.5 oz)     Height:        Intake/Output Summary (Last 24 hours) at 01/05/2018 1439 Last data filed at 01/05/2018 0557 Gross per 24 hour  Intake 400 ml  Output 150 ml  Net 250 ml   Filed Weights   01/02/18 0500 01/03/18 0502 01/05/18 0557  Weight: 51.1 kg (112 lb 9.6 oz) 50.3 kg (110 lb 14.4 oz) 54.9 kg (121 lb 0.5 oz)    Physical Exam  Constitutional: Appears well-developed and well-nourished. No distress.   CVS: RRR, S1/S2 + Pulmonary:diminished breath sounds, no wheezing  Abdominal: Soft. BS +,  no distension, tenderness, rebound or guarding.  Musculoskeletal: Normal range of motion. No edema and no tenderness.  Lymphadenopathy: No lymphadenopathy noted, cervical, inguinal. Neuro: Alert. Normal reflexes, muscle tone coordination. No cranial nerve deficit. Skin: Skin is  warm and dry. Psychiatric: Normal mood and affect. Behavior, judgment, thought content normal.    Data Reviewed: I have personally reviewed following labs and imaging studies  CBC: Recent Labs  Lab 12/30/17 0527 12/31/17 0424 01/04/18 2042 01/05/18 0607  WBC 11.1* 12.9* 12.6* 11.5*  HGB 9.9* 9.3* 11.5* 12.3*  HCT 28.9* 27.8* 34.6* 35.5*  MCV 77.1* 77.4* 78.1 73.8*  PLT 665* 611* 635* 097*   Basic Metabolic Panel: Recent Labs  Lab 12/31/17 0424 01/01/18 0747 01/04/18 0428 01/04/18 2042 01/05/18 0607  NA 127* 128* 130*  --  132*  K 4.6 4.8 4.7  --  4.9    CL 99* 96* 97*  --  95*  CO2 21* 24 24  --  26  GLUCOSE 91 88 89  --  130*  BUN 16 17 26*  --  33*  CREATININE 0.76 0.88 1.07 1.14 1.80*  CALCIUM 8.2* 8.5* 8.2*  --  8.4*  MG  --   --   --   --  2.0   GFR: Estimated Creatinine Clearance: 30.9 mL/min (A) (by C-G formula based on SCr of 1.8 mg/dL (H)). Liver Function Tests: No results for input(s): AST, ALT, ALKPHOS, BILITOT, PROT, ALBUMIN in the last 168 hours. No results for input(s): LIPASE, AMYLASE in the last 168 hours. No results for input(s): AMMONIA in the last 168 hours. Coagulation Profile: No results for input(s): INR, PROTIME in the last 168 hours. Cardiac Enzymes: No results for input(s): CKTOTAL, CKMB, CKMBINDEX, TROPONINI in the last 168 hours. BNP (last 3 results) No results for input(s): PROBNP in the last 8760 hours. HbA1C: No results for input(s): HGBA1C in the last 72 hours. CBG: No results for input(s): GLUCAP in the last 168 hours. Lipid Profile: No results for input(s): CHOL, HDL, LDLCALC, TRIG, CHOLHDL, LDLDIRECT in the last 72 hours. Thyroid Function Tests: No results for input(s): TSH, T4TOTAL, FREET4, T3FREE, THYROIDAB in the last 72 hours. Anemia Panel: No results for input(s): VITAMINB12, FOLATE, FERRITIN, TIBC, IRON, RETICCTPCT in the last 72 hours. Urine analysis: No results found for: COLORURINE, APPEARANCEUR, LABSPEC, PHURINE, GLUCOSEU, HGBUR, BILIRUBINUR, KETONESUR, PROTEINUR, UROBILINOGEN, NITRITE, LEUKOCYTESUR Sepsis Labs: @LABRCNTIP (procalcitonin:4,lacticidven:4)   Acid Fast Smear (AFB)     Status: None   Collection Time: 12/19/17  5:29 PM  Result Value Ref Range Status   AFB Specimen Processing Concentration  Final   Acid Fast Smear Negative  Final    Comment: (NOTE) Performed At: [facility] [place], [place] [id number] Rush Farmer MD AS:[id number]    Source (AFB) TISSUE  Final    Comment: RIGHT UPPER LUNG   Culture, fungus without smear     Status:  None (Preliminary result)   Collection Time: 12/19/17  5:29 PM  Result Value Ref Range Status   Specimen Description TISSUE RIGHT UPPER LUNG  Final   Special Requests NONE  Final   Culture   Final    NO FUNGUS ISOLATED AFTER 7 DAYS Performed at [facility], [address], [place], [place] [address]    Report Status PENDING  Incomplete  Aerobic/Anaerobic Culture (surgical/deep wound)     Status: None   Collection Time: 12/19/17  5:29 PM  Result Value Ref Range Status   Specimen Description TISSUE RIGHT UPPER LUNG  Final   Special Requests NONE  Final   Gram Stain   Final    FEW WBC PRESENT,BOTH PMN AND MONONUCLEAR NO ORGANISMS SEEN    Culture   Final  No growth aerobically or anaerobically. Performed at [facility], [place] [address], [place],  [address]    Report Status 12/24/2017 FINAL  Final  MRSA PCR Screening     Status: None   Collection Time: 12/28/17 10:46 PM  Result Value Ref Range Status   MRSA by PCR NEGATIVE NEGATIVE Final      Radiology Studies: Dg Chest 2 View Result Date: 12/28/2017 Stable nodular densities bilaterally.  No pneumothorax is noted. New small right-sided pleural effusion. Patchy changes in the upper lobes bilaterally increased from the previous exam which may represent some early infiltrate.     Scheduled Meds: . aspirin  81 mg Oral Daily  . enoxaparin   40 mg Subcutaneous Q24H   Continuous Infusions: . azithromycin Stopped (01/04/18 1640)  . cefOXitin Stopped (01/05/18 1320)  . tigecycline (TYGACIL) IVPB 50 mg (01/05/18 0557)     LOS: 8 days    Time spent: 25 minutes  Greater than 50% of the time spent on counseling and coordinating the care.   Leisa Lenz, MD [facility] Pager [phone]  If 7PM-7AM, please contact night-coverage [email or web address] Password [facility] 01/05/2018, 2:39 PM

## 2018-01-05 NOTE — Progress Notes (Signed)
  Echocardiogram 2D Echocardiogram has been performed.  [name] 01/05/2018, 3:00 PM

## 2018-01-05 NOTE — Plan of Care (Signed)
  Problem: Coping: Goal: Level of anxiety will decrease Outcome: Progressing Note:  No s/s of anxiety noted.   Problem: Pain Managment: Goal: General experience of comfort will improve Outcome: Progressing Note:  Repositioned in bed; denies c/o pain.   Problem: Safety: Goal: Ability to remain free from injury will improve Outcome: Progressing Note:  Bed alarm on, up only with assistance.  Call light and personal items within reach; frequent observation/hourly rounding.

## 2018-01-06 ENCOUNTER — Inpatient Hospital Stay (HOSPITAL_COMMUNITY): Payer: Medicare Other

## 2018-01-06 DIAGNOSIS — E43 Unspecified severe protein-calorie malnutrition: Secondary | ICD-10-CM

## 2018-01-06 DIAGNOSIS — R4182 Altered mental status, unspecified: Secondary | ICD-10-CM

## 2018-01-06 DIAGNOSIS — I5021 Acute systolic (congestive) heart failure: Secondary | ICD-10-CM

## 2018-01-06 LAB — URINALYSIS, ROUTINE W REFLEX MICROSCOPIC
Bilirubin Urine: NEGATIVE
GLUCOSE, UA: NEGATIVE mg/dL
Ketones, ur: 5 mg/dL — AB
NITRITE: NEGATIVE
PH: 5 (ref 5.0–8.0)
PROTEIN: NEGATIVE mg/dL
SPECIFIC GRAVITY, URINE: 1.023 (ref 1.005–1.030)

## 2018-01-06 LAB — BLOOD GAS, ARTERIAL
ACID-BASE EXCESS: 1 mmol/L (ref 0.0–2.0)
BICARBONATE: 24.3 mmol/L (ref 20.0–28.0)
Drawn by: 275531
O2 Saturation: 96 %
PCO2 ART: 33.8 mmHg (ref 32.0–48.0)
PO2 ART: 84.4 mmHg (ref 83.0–108.0)
Patient temperature: 98.6
pH, Arterial: 7.471 — ABNORMAL HIGH (ref 7.350–7.450)

## 2018-01-06 LAB — CBC
HEMATOCRIT: 31.3 % — AB (ref 39.0–52.0)
Hemoglobin: 10.8 g/dL — ABNORMAL LOW (ref 13.0–17.0)
MCH: 25.2 pg — ABNORMAL LOW (ref 26.0–34.0)
MCHC: 34.5 g/dL (ref 30.0–36.0)
MCV: 73.1 fL — AB (ref 78.0–100.0)
Platelets: 643 10*3/uL — ABNORMAL HIGH (ref 150–400)
RBC: 4.28 MIL/uL (ref 4.22–5.81)
RDW: 13.2 % (ref 11.5–15.5)
WBC: 22.5 10*3/uL — ABNORMAL HIGH (ref 4.0–10.5)

## 2018-01-06 LAB — BASIC METABOLIC PANEL
ANION GAP: 7 (ref 5–15)
BUN: 42 mg/dL — ABNORMAL HIGH (ref 6–20)
CO2: 25 mmol/L (ref 22–32)
Calcium: 8.2 mg/dL — ABNORMAL LOW (ref 8.9–10.3)
Chloride: 100 mmol/L — ABNORMAL LOW (ref 101–111)
Creatinine, Ser: 1.17 mg/dL (ref 0.61–1.24)
GFR calc Af Amer: >60 mL/min (ref >60)
GFR calc non Af Amer: >60 mL/min (ref >60)
GLUCOSE: 104 mg/dL — AB (ref 65–99)
POTASSIUM: 4.7 mmol/L (ref 3.5–5.1)
Sodium: 132 mmol/L — ABNORMAL LOW (ref 135–145)

## 2018-01-06 LAB — TSH: TSH: 0.383 u[IU]/mL (ref 0.350–4.500)

## 2018-01-06 LAB — AMMONIA: Ammonia: 30 umol/L (ref 9–35)

## 2018-01-06 MED ORDER — ENSURE ENLIVE PO LIQD
237.0000 mL | Freq: Three times a day (TID) | ORAL | Status: DC
  Administered 2018-01-08 (×2): 237 mL via ORAL

## 2018-01-06 MED ORDER — POLYETHYLENE GLYCOL 3350 17 G PO PACK
17.0000 g | PACK | Freq: Every day | ORAL | Status: DC
  Administered 2018-01-06 – 2018-01-08 (×2): 17 g via ORAL
  Filled 2018-01-06 (×3): qty 1

## 2018-01-06 MED ORDER — LOSARTAN POTASSIUM 50 MG PO TABS
25.0000 mg | ORAL_TABLET | Freq: Every day | ORAL | Status: DC
  Administered 2018-01-06 – 2018-01-08 (×2): 25 mg via ORAL
  Filled 2018-01-06 (×3): qty 1

## 2018-01-06 MED ORDER — SODIUM CHLORIDE 0.9 % IV SOLN
8.0000 g | INTRAVENOUS | Status: DC
  Administered 2018-01-07: 8 g via INTRAVENOUS
  Filled 2018-01-06: qty 8

## 2018-01-06 MED ORDER — CARVEDILOL 6.25 MG PO TABS
6.2500 mg | ORAL_TABLET | Freq: Two times a day (BID) | ORAL | Status: DC
  Administered 2018-01-06 – 2018-01-08 (×3): 6.25 mg via ORAL
  Filled 2018-01-06 (×2): qty 1
  Filled 2018-01-06: qty 2

## 2018-01-06 MED ORDER — FUROSEMIDE 10 MG/ML IJ SOLN
40.0000 mg | Freq: Every day | INTRAMUSCULAR | Status: DC
  Administered 2018-01-06 – 2018-01-09 (×4): 40 mg via INTRAVENOUS
  Filled 2018-01-06 (×4): qty 4

## 2018-01-06 NOTE — Progress Notes (Signed)
PT Cancellation Note  Patient Details Name: Othniel Maret MRN: [id number] DOB: 03/07/1951   Cancelled Treatment:    Reason Eval/Treat Not Completed: (Too lethargic in am and in pm to participate)   Denice Paradise 01/06/2018, 4:19 PM  [place] Michell Kader,PT Acute Rehabilitation [phone] (pager)

## 2018-01-06 NOTE — Progress Notes (Signed)
Spoke with Dr. Tawanna Solo regarding patient with confusion and severe weakness this morning and overnight per night shift RN report. Patient is only alert to self at this time; disoriented to place, time, and situation. Patient can move all extremities but requires +2 staff assist to stand. Patient particularly having difficulty moving right upper extremity. Per significant other at bedside, patient was walking without assistance on "Thursday" and was not confused but has been "getting worse this weekend."   Dr. Tawanna Solo came to the bedside to assess the patient. RN will continue to monitor closely.

## 2018-01-06 NOTE — Progress Notes (Signed)
PROGRESS NOTE    [name]  LEX:[id number] DOB: 1951-08-15 DOA: 12/28/2017 PCP: Bernerd Limbo, MD   Brief Narrative: Patient is a 67 year old male with past medical history of COPD, prostate cancer not on treatment, smoker, asthma who presented to the emergency department with worsening shortness of breath and cough.  Chest x-ray on admission showed developing infiltrates in the upper and lower lung zones patient was admitted for further management with IV antibiotics.  ID was consulted. Patient's hospital course is complicated by persistent pneumonia.  He also developed acute altered mental status today.  Assessment & Plan:   Principal Problem:   Lobar pneumonia ([place]) Active Problems:   G E R D   Lung nodules   Hyponatremia   Mycobacterial disease   Pleural effusion   Altered mental status  Lobar pneumonia: Chest x-ray has shown a stable nodular densities bilaterally.  New small right-sided pleural effusion, patchy changes in the upper lobes bilaterally increased from the previous exam which may represent some early infiltrate. He was initially started on Vanco and cefepime.  ID is following.  Currently he is on azithromycin, cefoxitin and tigecycline. Pulmonary was also consulted few  days ago but they have signed off. He is status post thoracentesis on 4/26.  Pleural effusion is exudative but no lymphatic predominance. Continue antibiotics.  He has severe leukocytosis.  Remains afebrile. Awaiting cultures, reports from the thoracentesis fluid.  Blood cultures have been negative  Lung nodules: CT-guided core biopsy of the right upper lobe nodule on 4/11 showed inflammation and fibrosis with numerous plasma cells.  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  Cardiology consulted today  AKI: Suspected to be from vancomycin.  Vancomycin  discontinued.  We will continue to monitor his kidney function.  Hyponatremia: Currently stable.  We will continue to monitor.  Anemia of chronic disease: Currently H&H is stable.  Moderate protein calorie malnutrition/failure to thrive: Nutrition following.  History of asthma Presently not wheezing. Continue inhalers at home  History of obstructive coronary artery disease No chest pain  Normocytic anemia Stable  Prostate cancer: Diagnosed with prostate cancer apparently 1 year ago . PET scan done in December did not show any evidence of metastasis. 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 He used to follow up with urology,Dr Tresa Moore.He was  following with radiation oncology, Dr. Tammi Klippel.As per the last  note, patient was reluctant to move forward with treatment for prostate cancer due to risks for erectile dysfunction. He was last seen by radiation oncology on 10/16/17, Dr. Tammi Klippel.    DVT prophylaxis: Lovenox Code Status: Full Family Communication: Discussed with daughter today Disposition Plan: Undetermined at this point   Consultants: Infectious disease, pulmonary  Procedures:  Antimicrobials: Cefoxitin, tigecycline, azithromycin Total days of Abx : 8   Subjective: Patient seen and examined the bedside this morning.  Remains very weak and lethargic.  He is hemodynamically stable though.  Echocardiogram showed severely reduced systolic function.  Cardiology consulted.  Will update to his daughter after getting reports from CT.  Objective: Vitals:   01/06/18 1015 01/06/18 1110 01/06/18 1215 01/06/18 1405  BP: (!) 144/95 (!) 144/95 (!) 157/95   Pulse: 78 84 69 71  Resp:    18  Temp:    98.1 F (36.7 C)  TempSrc:    Oral  SpO2: 97%  98%   Weight:  Height:        Intake/Output Summary (Last 24 hours) at 01/06/2018 1411 Last data filed at 01/06/2018 1124 Gross per 24 hour  Intake 800 ml  Output 825 ml  Net -25 ml    Filed Weights   01/03/18 0502 01/05/18 0557 01/06/18 0555  Weight: 50.3 kg (110 lb 14.4 oz) 54.9 kg (121 lb 0.5 oz) 50.6 kg (111 lb 9.6 oz)    Examination:  General exam: Very weak, lethargic, cachectic HEENT:PERRL,Oral mucosa moist, Ear/Nose normal on gross exam Respiratory system: Bilateral decreased air entry Cardiovascular system: S1 & S2 heard, RRR. No JVD, murmurs, rubs, gallops or clicks.  Trace pedal edema. Gastrointestinal system: Abdomen is nondistended, soft and nontender. No organomegaly or masses felt. Normal bowel sounds heard. Central nervous system: Alert and awake but confused. No focal neurological deficits. Extremities: Trace edema, no clubbing ,no cyanosis, distal peripheral pulses palpable. Skin: No rashes, lesions or ulcers,no icterus ,no pallor    Data Reviewed: I have personally reviewed following labs and imaging studies  CBC: Recent Labs  Lab 12/31/17 0424 01/04/18 2042 01/05/18 0607 01/06/18 0459  WBC 12.9* 12.6* 11.5* 22.5*  HGB 9.3* 11.5* 12.3* 10.8*  HCT 27.8* 34.6* 35.5* 31.3*  MCV 77.4* 78.1 73.8* 73.1*  PLT 611* 635* 591* 622*   Basic Metabolic Panel: Recent Labs  Lab 12/31/17 0424 01/01/18 0747 01/04/18 0428 01/04/18 2042 01/05/18 0607 01/06/18 0459  NA 127* 128* 130*  --  132* 132*  K 4.6 4.8 4.7  --  4.9 4.7  CL 99* 96* 97*  --  95* 100*  CO2 21* 24 24  --  26 25  GLUCOSE 91 88 89  --  130* 104*  BUN 16 17 26*  --  33* 42*  CREATININE 0.76 0.88 1.07 1.14 1.80* 1.17  CALCIUM 8.2* 8.5* 8.2*  --  8.4* 8.2*  MG  --   --   --   --  2.0  --    GFR: Estimated Creatinine Clearance: 43.8 mL/min (by C-G formula based on SCr of 1.17 mg/dL). Liver Function Tests: No results for input(s): AST, ALT, ALKPHOS, BILITOT, PROT, ALBUMIN in the last 168 hours. No results for input(s): LIPASE, AMYLASE in the last 168 hours. No results for input(s): AMMONIA in the last 168 hours. Coagulation Profile: No results for input(s): INR, PROTIME in  the last 168 hours. Cardiac Enzymes: No results for input(s): CKTOTAL, CKMB, CKMBINDEX, TROPONINI in the last 168 hours. BNP (last 3 results) No results for input(s): PROBNP in the last 8760 hours. HbA1C: No results for input(s): HGBA1C in the last 72 hours. CBG: No results for input(s): GLUCAP in the last 168 hours. Lipid Profile: No results for input(s): CHOL, HDL, LDLCALC, TRIG, CHOLHDL, LDLDIRECT in the last 72 hours. Thyroid Function Tests: No results for input(s): TSH, T4TOTAL, FREET4, T3FREE, THYROIDAB in the last 72 hours. Anemia Panel: No results for input(s): VITAMINB12, FOLATE, FERRITIN, TIBC, IRON, RETICCTPCT in the last 72 hours. Sepsis Labs: No results for input(s): PROCALCITON, LATICACIDVEN in the last 168 hours.  Recent Results (from the past 240 hour(s))  MRSA PCR Screening     Status: None   Collection Time: 12/28/17 10:46 PM  Result Value Ref Range Status   MRSA by PCR NEGATIVE NEGATIVE Final    Comment:        The GeneXpert MRSA Assay (FDA approved for NASAL specimens only), is one component of a comprehensive MRSA colonization surveillance program. It is not intended to diagnose MRSA  infection nor to guide or monitor treatment for MRSA infections. Performed at [facility], [place] [address] [place], [place] [address]   Culture, blood (routine x 2) Call MD if unable to obtain prior to antibiotics being given     Status: None   Collection Time: 12/28/17 10:54 PM  Result Value Ref Range Status   Specimen Description BLOOD LEFT ARM  Final   Special Requests   Final    BOTTLES DRAWN AEROBIC AND ANAEROBIC Blood Culture results may not be optimal due to an inadequate volume of blood received in culture bottles   Culture   Final    NO GROWTH 5 DAYS Performed at [facility], [place] [address] [place], [place] [address]    Report Status 01/02/2018 FINAL  Final  Culture, blood (routine x 2) Call MD if unable to obtain prior to antibiotics being given      Status: None   Collection Time: 12/28/17 10:54 PM  Result Value Ref Range Status   Specimen Description BLOOD RIGHT HAND  Final   Special Requests   Final    BOTTLES DRAWN AEROBIC AND ANAEROBIC Blood Culture adequate volume   Culture   Final    NO GROWTH 5 DAYS Performed at [facility], [place] [address], [place], [place] [address]    Report Status 01/02/2018 FINAL  Final  Culture, body fluid-bottle     Status: None (Preliminary result)   Collection Time: 01/03/18  3:04 PM  Result Value Ref Range Status   Specimen Description PLEURAL RIGHT  Final   Special Requests NONE  Final   Culture   Final    NO GROWTH 2 DAYS Performed at [facility] [address] [place],  [address]    Report Status PENDING  Incomplete         Radiology Studies: No results found.      Scheduled Meds: . aspirin  81 mg Oral Daily  . benzonatate  200 mg Oral TID  . chlorpheniramine-HYDROcodone  5 mL Oral Q12H  . diltiazem  30 mg Oral Q8H  . enoxaparin (LOVENOX) injection  40 mg Subcutaneous Q24H  . guaiFENesin  600 mg Oral BID  . ipratropium-albuterol  3 mL Nebulization BID  . mouth rinse  15 mL Mouth Rinse BID  . methylPREDNISolone (SOLU-MEDROL) injection  60 mg Intravenous Q24H  . metoprolol tartrate  25 mg Oral BID  . ondansetron (ZOFRAN) IV  4 mg Intravenous Q12H  . sodium chloride flush  3 mL Intravenous Q12H   Continuous Infusions: . azithromycin Stopped (01/05/18 1622)  . cefOXitin Stopped (01/06/18 5027)  . [START ON 01/07/2018] cefOXitin (MEFOXIN) continuous infusion    . tigecycline (TYGACIL) IVPB Stopped (01/06/18 1124)     LOS: 9 days    Time spent: 35 mins.More than 50% of that time was spent in counseling and/or coordination of care.      Shelly Coss, MD [facility] Pager [phone]  If 7PM-7AM, please contact night-coverage [email or web address] Password [facility] 01/06/2018, 2:11 PM

## 2018-01-06 NOTE — Progress Notes (Signed)
[place] on call with [facility] returned page and patient was discussed.  No new orders at this time.

## 2018-01-06 NOTE — Progress Notes (Signed)
INFECTIOUS DISEASE PROGRESS NOTE  ID: [name] is a 67 y.o. male with  Principal Problem:   Lobar pneumonia ([place]) Active Problems:   G E R D   Lung nodules   Hyponatremia   Mycobacterial disease   Pleural effusion  Subjective: Speaks to questioning, faint voice.   Abtx:  Anti-infectives (From admission, onward)   Start     Dose/Rate Route Frequency Ordered Stop   01/07/18 0800  cefOXitin (MEFOXIN) 8 g in sodium chloride 0.9 % 210 mL continuous infusion     8 g 12.5 mL/hr over 20 Hours Intravenous Every 24 hours 01/06/18 1125     01/03/18 1800  cefOXitin (MEFOXIN) 3 g in dextrose 5 % 50 mL IVPB     3 g 100 mL/hr over 30 Minutes Intravenous Every 6 hours 01/03/18 1423 01/07/18 0200   12/30/17 0600  tigecycline (TYGACIL) 50 mg in sodium chloride 0.9 % 100 mL IVPB     50 mg 200 mL/hr over 30 Minutes Intravenous Every 12 hours 12/29/17 1747     12/30/17 0200  tigecycline (TYGACIL) 50 mg in sodium chloride 0.9 % 100 mL IVPB  Status:  Discontinued     50 mg 200 mL/hr over 30 Minutes Intravenous Every 12 hours 12/29/17 1345 12/29/17 1747   12/29/17 1430  cefOXitin (MEFOXIN) 2 g in sodium chloride 0.9 % 100 mL IVPB  Status:  Discontinued     2 g 200 mL/hr over 30 Minutes Intravenous Every 6 hours 12/29/17 1345 01/03/18 1423   12/29/17 1400  azithromycin (ZITHROMAX) 500 mg in sodium chloride 0.9 % 250 mL IVPB     500 mg 250 mL/hr over 60 Minutes Intravenous Every 24 hours 12/29/17 1345     12/29/17 1345  tigecycline (TYGACIL) 100 mg in sodium chloride 0.9 % 100 mL IVPB    Note to Pharmacy:  Please schedule zofran 8mg  iv as anti-emetic prior to infusion   100 mg 200 mL/hr over 30 Minutes Intravenous  Once 12/29/17 1345 12/29/17 2138   12/29/17 0600  vancomycin (VANCOCIN) 500 mg in sodium chloride 0.9 % 100 mL IVPB  Status:  Discontinued     500 mg 100 mL/hr over 60 Minutes Intravenous Every 12 hours 12/28/17 1617 12/29/17 0858   12/28/17 2200  ceFEPIme (MAXIPIME) 1 g in  sodium chloride 0.9 % 100 mL IVPB  Status:  Discontinued     1 g 200 mL/hr over 30 Minutes Intravenous Every 8 hours 12/28/17 2123 12/29/17 1342   12/28/17 1645  ceFEPIme (MAXIPIME) 2 g in sodium chloride 0.9 % 100 mL IVPB     2 g 200 mL/hr over 30 Minutes Intravenous  Once 12/28/17 1645 12/28/17 1757   12/28/17 1630  ceFEPIme (MAXIPIME) 1 g in sodium chloride 0.9 % 100 mL IVPB  Status:  Discontinued     1 g 200 mL/hr over 30 Minutes Intravenous  Once 12/28/17 1612 12/28/17 1645   12/28/17 1630  vancomycin (VANCOCIN) IVPB 1000 mg/200 mL premix     1,000 mg 200 mL/hr over 60 Minutes Intravenous  Once 12/28/17 1617 12/28/17 1910      Medications:  Scheduled: . aspirin  81 mg Oral Daily  . benzonatate  200 mg Oral TID  . chlorpheniramine-HYDROcodone  5 mL Oral Q12H  . diltiazem  30 mg Oral Q8H  . enoxaparin (LOVENOX) injection  40 mg Subcutaneous Q24H  . guaiFENesin  600 mg Oral BID  . ipratropium-albuterol  3 mL Nebulization BID  . mouth  rinse  15 mL Mouth Rinse BID  . methylPREDNISolone (SOLU-MEDROL) injection  60 mg Intravenous Q24H  . metoprolol tartrate  25 mg Oral BID  . ondansetron (ZOFRAN) IV  4 mg Intravenous Q12H  . sodium chloride flush  3 mL Intravenous Q12H    Objective: Vital signs in last 24 hours: Temp:  [97.7 F (36.5 C)-98.4 F (36.9 C)] 98.3 F (36.8 C) (04/29 0550) Pulse Rate:  [78-92] 84 (04/29 1110) Resp:  [15-22] 22 (04/29 0550) BP: (140-160)/(95-105) 144/95 (04/29 1110) SpO2:  [95 %-99 %] 97 % (04/29 0849) Weight:  [50.6 kg (111 lb 9.6 oz)] 50.6 kg (111 lb 9.6 oz) (04/29 0555)   General appearance: fatigued, no distress and slowed mentation Throat: abnormal findings: crusting on lips Resp: clear to auscultation bilaterally Cardio: regular rate and rhythm GI: normal findings: bowel sounds normal and soft, non-tender Neurologic: Mental status: alertness: lethargic  Lab Results Recent Labs    01/05/18 0607 01/06/18 0459  WBC 11.5* 22.5*  HGB  12.3* 10.8*  HCT 35.5* 31.3*  NA 132* 132*  K 4.9 4.7  CL 95* 100*  CO2 26 25  BUN 33* 42*  CREATININE 1.80* 1.17   Liver Panel No results for input(s): PROT, ALBUMIN, AST, ALT, ALKPHOS, BILITOT, BILIDIR, IBILI in the last 72 hours. Sedimentation Rate No results for input(s): ESRSEDRATE in the last 72 hours. C-Reactive Protein No results for input(s): CRP in the last 72 hours.  Microbiology: Recent Results (from the past 240 hour(s))  MRSA PCR Screening     Status: None   Collection Time: 12/28/17 10:46 PM  Result Value Ref Range Status   MRSA by PCR NEGATIVE NEGATIVE Final    Comment:        The GeneXpert MRSA Assay (FDA approved for NASAL specimens only), is one component of a comprehensive MRSA colonization surveillance program. It is not intended to diagnose MRSA infection nor to guide or monitor treatment for MRSA infections. Performed at [facility], [place] [address], [place], [place] [address]   Culture, blood (routine x 2) Call MD if unable to obtain prior to antibiotics being given     Status: None   Collection Time: 12/28/17 10:54 PM  Result Value Ref Range Status   Specimen Description BLOOD LEFT ARM  Final   Special Requests   Final    BOTTLES DRAWN AEROBIC AND ANAEROBIC Blood Culture results may not be optimal due to an inadequate volume of blood received in culture bottles   Culture   Final    NO GROWTH 5 DAYS Performed at [facility], [place] [address], [place], [place] [address]    Report Status 01/02/2018 FINAL  Final  Culture, blood (routine x 2) Call MD if unable to obtain prior to antibiotics being given     Status: None   Collection Time: 12/28/17 10:54 PM  Result Value Ref Range Status   Specimen Description BLOOD RIGHT HAND  Final   Special Requests   Final    BOTTLES DRAWN AEROBIC AND ANAEROBIC Blood Culture adequate volume   Culture   Final    NO GROWTH 5 DAYS Performed at [facility], [place] [address], [place], [place]  [address]    Report Status 01/02/2018 FINAL  Final  Culture, body fluid-bottle     Status: None (Preliminary result)   Collection Time: 01/03/18  3:04 PM  Result Value Ref Range Status   Specimen Description PLEURAL RIGHT  Final   Special  Requests NONE  Final   Culture   Final    NO GROWTH 2 DAYS Performed at [facility], [place] [address] [place], [place] [address]    Report Status PENDING  Incomplete    Studies/Results: No results found.   Assessment/Plan: Lung nodules Non-TB mycobacterial infection Dyspnea Prostate CA Mental status change  Total days of antibiotics:8cefoxitin, tigecycline, azithro  Primary team to eval for mental status change. he is afeb but WBC have increased.   Will send UA and BCx Await more info from lab (final May 25) No change in anbx for now (shortage of cefoxitin pending)          Bobby Rumpf MD, FACP Infectious Diseases (pager) [phone] [email or web address] 01/06/2018, 11:26 AM  LOS: 9 days

## 2018-01-06 NOTE — Progress Notes (Signed)
Initial Nutrition Assessment  DOCUMENTATION CODES:   Severe malnutrition in context of chronic illness  INTERVENTION:    Ensure Enlive po BID, each supplement provides 350 kcal and 20 grams of protein  NUTRITION DIAGNOSIS:   Severe Malnutrition related to chronic illness(COPD, CAD) as evidenced by severe muscle depletion, severe fat depletion.  GOAL:   Patient will meet greater than or equal to 90% of their needs  MONITOR:   PO intake, Supplement acceptance  REASON FOR ASSESSMENT:   LOS    ASSESSMENT:   67 yo male with PMH of GERD, CAD, COPD, prostate CA, diverticulosis, DDD, and HTN who was admitted on 4/20 with lobar pneumonia.  Patient unable to provide any nutrition hx during RD visit. No family present. RT in room drawing blood to check arterial blood gas.   From review of flow sheets, patient has been consuming 0-75% of meals, mostly 0%.   Weight trend has been fairly stable.   Labs reviewed. Sodium 132 (L) Medications reviewed.  NUTRITION - FOCUSED PHYSICAL EXAM:    Most Recent Value  Orbital Region  Moderate depletion  Upper Arm Region  Severe depletion  Thoracic and Lumbar Region  Moderate depletion  Buccal Region  Severe depletion  Temple Region  Severe depletion  Clavicle Bone Region  Severe depletion  Clavicle and Acromion Bone Region  Moderate depletion  Scapular Bone Region  Moderate depletion  Dorsal Hand  Mild depletion  Patellar Region  Mild depletion  Anterior Thigh Region  Mild depletion  Posterior Calf Region  Unable to assess  Edema (RD Assessment)  Mild  Hair  Reviewed  Eyes  Reviewed  Mouth  Unable to assess  Skin  Reviewed  Nails  Reviewed       Diet Order:  Diet regular Room service appropriate? Yes; Fluid consistency: Thin  EDUCATION NEEDS:   No education needs have been identified at this time  Skin:  Skin Assessment: Reviewed RN Assessment  Last BM:  4/21  Height:   Ht Readings from Last 1 Encounters:  12/28/17  5\' 4"  (1.626 m)    Weight:   Wt Readings from Last 1 Encounters:  01/06/18 111 lb 9.6 oz (50.6 kg)    Ideal Body Weight:  59.1 kg  BMI:  Body mass index is 19.16 kg/m.  Estimated Nutritional Needs:   Kcal:  1600-1800  Protein:  75-85 gm  Fluid:  1.6 L    Molli Barrows, RD, LDN, [place] Pager [phone] After Hours Pager [phone]

## 2018-01-06 NOTE — Progress Notes (Signed)
OT Cancellation Note  Patient Details Name: [name] MRN: [id number] DOB: Oct 10, 1950   Cancelled Treatment:    Reason Eval/Treat Not Completed: Fatigue/lethargy limiting ability to participate. Pt with significantly elevated BP this afternoon and highly lethargic. He was unable to maintain alert state for participation in OT session. Will check back as able.   Norman Herrlich, MS OTR/L  Pager: [phone]   Norman Herrlich 01/06/2018, 4:23 PM

## 2018-01-06 NOTE — Consult Note (Signed)
Cardiology Consultation:   Patient ID: [name]; [id number]; [birth year]   Admit date: 12/28/2017 Date of Consult: 01/06/2018  Primary Care Provider: Bernerd Limbo, MD Primary Cardiologist: No primary care provider on file.   Patient Profile:   [name] is a 67 y.o. male with a hx of prostate cancer, hypertension, GERD, COPD, CAD with mild nonobstructive disease by cath 2004 who is being seen today for the evaluation of CHF at the request of Dr. Tawanna Solo.  History of Present Illness:   Mr. [name] was admitted to [facility] on 12/28/2017 with complaints of worsening shortness of breath and cough. Chest x-ray showed developing infiltrates in the upper and lower lung zones.  The patient has been started on IV antibiotics and is being followed by infectious disease.  An echocardiogram was done on 01/05/2018 that showed severely reduced LV systolic function with EF 25-30%, diffuse hypokinesis, grade 1 diastolic dysfunction.  Per the medical history report the patient has a history of a cardiac cath on 01/08/2003 showing nonobstructive mild CAD with normal LV systolic function.  I do not see these records to review.  Upon my assessment the pt is very weak and not very communicative. He only answers a few simple questions. He denies prior MI, heart failure (weakness) or being followed by a cardiologist. He denies chest pain, shortness of breath, swelling, orthopnea. He says that he lives alone in an apartment. He has a remote smoking history having quit many years ago, at least 31 per his recollection. He does drink alcohol, liquor, every day if he can get it. He is not clear on whether he was drinking alcohol recently.      Past Medical History:  Diagnosis Date  . BPH with urinary obstruction   . CAD (coronary artery disease)    per cardiac cath 01-08-2003  non-obstructive mild cad w/ normal LVSF  . COPD (chronic obstructive pulmonary disease) ([place])    per pulmologist note in  2009  GOLD 1  . DDD (degenerative disc disease), cervical    C5-7, C7-T1  . Diverticulosis of colon   . Elevated PSA   . GERD (gastroesophageal reflux disease)   . History of adenomatous polyp of colon   . History of hypertension   . History of precordial chest pain    as of 07-22-2017  denies cardiac S&S  . Prostate cancer ([place])   . Wears glasses     Past Surgical History:  Procedure Laterality Date  . CARDIAC CATHETERIZATION  01-08-2003   dr Lyndel Safe   mild non-obstructive CAD,  normal LVSF, ef 55% (positive myoview)  . COLONOSCOPY  last one 12-14-2015  . IR THORACENTESIS ASP PLEURAL SPACE W/IMG GUIDE  01/03/2018  . PROSTATE BIOPSY N/A 07/25/2017   Procedure: BIOPSY TRANSRECTAL ULTRASONIC PROSTATE (TUBP);  Surgeon: Alexis Frock, MD;  [facility];  Service: Urology;  Laterality: N/A;     Home Medications:  Prior to Admission medications   Medication Sig Start Date End Date Taking? Authorizing Provider  acetaminophen (TYLENOL) 500 MG tablet Take 500 mg by mouth every 6 (six) hours as needed for headache (pain).    Yes [provider]  albuterol (PROVENTIL HFA;VENTOLIN HFA) 108 (90 Base) MCG/ACT inhaler Inhale 2 puffs into the lungs every 6 (six) hours as needed for wheezing or shortness of breath. 12/21/17  Yes Shelly Coss, MD  aspirin 81 MG chewable tablet Chew 1 tablet (81 mg total) by mouth daily. 11/27/16  Yes Fredia Sorrow, MD  dextromethorphan-guaiFENesin (MUCINEX DM) 30-600 MG 12hr tablet Take 1 tablet by mouth 2 (two) times daily. 12/21/17  Yes Shelly Coss, MD  Multiple Vitamin (MULTIVITAMIN WITH MINERALS) TABS tablet Take 1 tablet by mouth daily.   Yes [provider]  OVER THE COUNTER MEDICATION Take 1 tablet by mouth 2 (two) times daily. Super Beta Prostate supplement   Yes [provider]  OVER THE COUNTER MEDICATION Apply 1 application topically daily as needed (arthritis pain). Over the counter arthritis pain  cream   Yes [provider]  sildenafil (REVATIO) 20 MG tablet Take 60 mg by mouth daily as needed (erectile dysfunction).  09/24/17  Yes [provider]  benzonatate (TESSALON) 100 MG capsule Take 1 capsule (100 mg total) by mouth every 8 (eight) hours. Patient not taking: Reported on 12/08/2017 11/14/17   Varney Biles, MD  traMADol (ULTRAM) 50 MG tablet Take 1 tablet (50 mg total) by mouth every 6 (six) hours as needed. Patient not taking: Reported on 12/08/2017 11/29/17   Milton Ferguson, MD    Inpatient Medications: Scheduled Meds: . aspirin  81 mg Oral Daily  . benzonatate  200 mg Oral TID  . chlorpheniramine-HYDROcodone  5 mL Oral Q12H  . diltiazem  30 mg Oral Q8H  . enoxaparin (LOVENOX) injection  40 mg Subcutaneous Q24H  . guaiFENesin  600 mg Oral BID  . ipratropium-albuterol  3 mL Nebulization BID  . mouth rinse  15 mL Mouth Rinse BID  . methylPREDNISolone (SOLU-MEDROL) injection  60 mg Intravenous Q24H  . metoprolol tartrate  25 mg Oral BID  . ondansetron (ZOFRAN) IV  4 mg Intravenous Q12H  . sodium chloride flush  3 mL Intravenous Q12H   Continuous Infusions: . azithromycin Stopped (01/05/18 1622)  . cefOXitin Stopped (01/06/18 1448)  . [START ON 01/07/2018] cefOXitin (MEFOXIN) continuous infusion    . tigecycline (TYGACIL) IVPB Stopped (01/06/18 1124)   PRN Meds: acetaminophen **OR** acetaminophen, alum & mag hydroxide-simeth, benzonatate, ipratropium-albuterol, lidocaine (PF), ondansetron **OR** ondansetron (ZOFRAN) IV  Allergies:   No Known Allergies  Social History:   Social History   Socioeconomic History  . Marital status: Widowed    Spouse name: Not on file  . Number of children: 2  . Years of education: Not on file  . Highest education level: Not on file  Occupational History  . Occupation: Custodian    Employer: [employer]  . Occupation: retired    Comment: 2015  Social Needs  . Financial resource strain: Not on file  .  Food insecurity:    Worry: Not on file    Inability: Not on file  . Transportation needs:    Medical: Not on file    Non-medical: Not on file  Tobacco Use  . Smoking status: Former Smoker    Packs/day: 1.50    Years: 20.00    Pack years: 30.00    Types: Cigarettes    Last attempt to quit: 07/23/1987    Years since quitting: 30.4  . Smokeless tobacco: Former Systems developer    Types: Snuff    Quit date: 07/23/1987  Substance and Sexual Activity  . Alcohol use: No    Frequency: Never  . Drug use: No  . Sexual activity: Not Currently  Lifestyle  . Physical activity:    Days per week: Not on file    Minutes per session: Not on file  . Stress: Not on file  Relationships  . Social connections:    Talks on phone: Not  on file    Gets together: Not on file    Attends religious service: Not on file    Active member of club or organization: Not on file    Attends meetings of clubs or organizations: Not on file    Relationship status: Not on file  . Intimate partner violence:    Fear of current or ex partner: Not on file    Emotionally abused: Not on file    Physically abused: Not on file    Forced sexual activity: Not on file  Other Topics Concern  . Not on file  Social History Narrative  . Not on file    Family History:    Family History  Problem Relation Age of Onset  . Heart disease Brother   . Colon cancer Brother   . Heart disease Sister   . Cancer Sister        unknown  . Cancer Mother        unknown     ROS:  Please see the history of present illness.   All other ROS reviewed and negative.     Physical Exam/Data:   Vitals:   01/06/18 1110 01/06/18 1215 01/06/18 1405 01/06/18 1423  BP: (!) 144/95 (!) 157/95  (!) 164/104  Pulse: 84 69 71 83  Resp:   18   Temp:   98.1 F (36.7 C)   TempSrc:   Oral   SpO2:  98%  98%  Weight:      Height:        Intake/Output Summary (Last 24 hours) at 01/06/2018 1507 Last data filed at 01/06/2018 1448 Gross per 24 hour    Intake 970 ml  Output 825 ml  Net 145 ml   Filed Weights   01/03/18 0502 01/05/18 0557 01/06/18 0555  Weight: 110 lb 14.4 oz (50.3 kg) 121 lb 0.5 oz (54.9 kg) 111 lb 9.6 oz (50.6 kg)   Body mass index is 19.16 kg/m.  General:  Thin male, in no acute distress HEENT: normal Lymph: no adenopathy Neck: no JVD Endocrine:  No thryomegaly Vascular: No carotid bruits; FA pulses 2+  Cardiac:  normal S1, S2; RRR; no murmur  Lungs:  clear to auscultation bilaterally, no wheezing, rhonchi or rales  Abd: soft, nontender, no hepatomegaly  Ext: Trace pretibial edema Musculoskeletal:  No deformities, BUE and BLE strength normal and equal Skin: warm and dry  Neuro:  CNs 2-12 intact, no focal abnormalities noted, oriented to self and place but unclear as to further orientation Psych:  Withdrawn, somnolent  EKG:  The EKG was personally reviewed and demonstrates:  Sinus tachycardia with Premature atrial complexes, 11 bpm, LAD, LVH, Nonspecific ST and T wave abnormality Telemetry:  Telemetry was personally reviewed and demonstrates:  Sinus rhythm in the 70's-80's  Relevant CV Studies:  Echocardiogram 01/05/2018 Study Conclusions - Left ventricle: The cavity size was normal. Systolic function was   severely reduced. The estimated ejection fraction was in the   range of 25% to 30%. Diffuse hypokinesis. Doppler parameters are   consistent with abnormal left ventricular relaxation (grade 1   diastolic dysfunction). Doppler parameters are consistent with   elevated ventricular end-diastolic filling pressure. - Mitral valve: There was moderate regurgitation directed centrally   and posteriorly. - Right ventricle: The cavity size was normal. Wall thickness was   normal. Systolic function was normal. - Tricuspid valve: There was trivial regurgitation. - Pulmonary arteries: Systolic pressure was within the normal   range. - Inferior  vena cava: The vessel was not visualized. - Pericardium,  extracardiac: There was no pericardial effusion.   Laboratory Data:  Chemistry Recent Labs  Lab 01/04/18 0428 01/04/18 2042 01/05/18 0607 01/06/18 0459  NA 130*  --  132* 132*  K 4.7  --  4.9 4.7  CL 97*  --  95* 100*  CO2 24  --  26 25  GLUCOSE 89  --  130* 104*  BUN 26*  --  33* 42*  CREATININE 1.07 1.14 1.80* 1.17  CALCIUM 8.2*  --  8.4* 8.2*  GFRNONAA >60 >60 37* >60  GFRAA >60 >60 43* >60  ANIONGAP 9  --  11 7    No results for input(s): PROT, ALBUMIN, AST, ALT, ALKPHOS, BILITOT in the last 168 hours. Hematology Recent Labs  Lab 01/04/18 2042 01/05/18 0607 01/06/18 0459  WBC 12.6* 11.5* 22.5*  RBC 4.43 4.81 4.28  HGB 11.5* 12.3* 10.8*  HCT 34.6* 35.5* 31.3*  MCV 78.1 73.8* 73.1*  MCH 26.0 25.6* 25.2*  MCHC 33.2 34.6 34.5  RDW 13.8 13.0 13.2  PLT 635* 591* 643*   Cardiac EnzymesNo results for input(s): TROPONINI in the last 168 hours. No results for input(s): TROPIPOC in the last 168 hours.  BNPNo results for input(s): BNP, PROBNP in the last 168 hours.  DDimer No results for input(s): DDIMER in the last 168 hours.  Radiology/Studies:  Dg Chest 1 View  Result Date: 01/03/2018 CLINICAL DATA:  Post thoracentesis. EXAM: CHEST  1 VIEW COMPARISON:  Chest radiograph earlier today. FINDINGS: RIGHT pleural effusion is decreased. There is no pneumothorax. Stable cardiac silhouette and BILATERAL pulmonary opacities. IMPRESSION: Improved RIGHT pleural effusion post thoracentesis. Electronically Signed   By: Staci Righter M.D.   On: 01/03/2018 15:10   Dg Chest Port 1 View  Result Date: 01/04/2018 CLINICAL DATA:  Evaluate for pleural effusion. EXAM: PORTABLE CHEST 1 VIEW COMPARISON:  Chest radiograph 01/03/2018. FINDINGS: Monitoring leads overlie the patient. Stable enlarged cardiac and mediastinal contours. Minimal left basilar atelectasis. Persistent moderate right pleural effusion. Interval increase in consolidation throughout the right mid and upper lung. IMPRESSION:  Persistent moderate right pleural effusion. Increasing consolidation throughout the mid and upper right lung, potentially secondary to infection. Electronically Signed   By: Lovey Newcomer M.D.   On: 01/04/2018 12:43   Dg Chest Port 1 View  Result Date: 01/03/2018 CLINICAL DATA:  Pleural effusion. EXAM: PORTABLE CHEST 1 VIEW COMPARISON:  Radiograph of January 02, 2018. FINDINGS: Stable cardiomediastinal silhouette. No pneumothorax or pleural effusion is noted. Stable right upper and lower lobe opacities are noted concerning for pneumonia or atelectasis. Small right pleural effusion is noted. Minimal left basilar subsegmental atelectasis is noted with pleural effusion. Bony thorax is unremarkable. IMPRESSION: Stable right lung opacities are noted concerning for pneumonia or atelectasis. Stable mild right pleural effusion is noted. Minimal left basilar subsegmental atelectasis is noted with minimal left pleural effusion. Electronically Signed   By: Marijo Conception, M.D.   On: 01/03/2018 09:46   Dg Chest Port 1 View  Result Date: 01/02/2018 CLINICAL DATA:  Short of breath EXAM: PORTABLE CHEST 1 VIEW COMPARISON:  01/01/2018, 12/28/2017, 12/19/2017, CT chest 12/08/2017 FINDINGS: Similar appearance of small right pleural effusion and upper lobe right greater than left nodular foci of airspace disease. Slight increased atelectasis or infiltrate at the right base. Stable cardiomediastinal silhouette. No pneumothorax. IMPRESSION: 1. No significant change in small right greater than left pleural effusions and right greater than left upper lobe nodules.  Mild diffuse ground-glass opacity in the right upper lobe, no change compared to 01/01/2018, slight increased compared to 12/28/2017. Electronically Signed   By: Donavan Foil M.D.   On: 01/02/2018 19:57   Ir Thoracentesis Asp Pleural Space W/img Guide  Result Date: 01/03/2018 INDICATION: History pulmonary nodules. Status post image guided biopsy of right lung nodule on  12/19/2017. Patient now with small right-sided pleural effusion. Request for diagnostic and therapeutic thoracentesis. EXAM: ULTRASOUND GUIDED RIGHT THORACENTESIS MEDICATIONS: None. COMPLICATIONS: None immediate. Postprocedural chest x-ray negative for pneumothorax. PROCEDURE: An ultrasound guided thoracentesis was thoroughly discussed with the patient and questions answered. The benefits, risks, alternatives and complications were also discussed. The patient understands and wishes to proceed with the procedure. Written consent was obtained. Ultrasound was performed to localize and mark an very small but adequate pocket of fluid in the right chest. The area was then prepped and draped in the normal sterile fashion. 1% Lidocaine was used for local anesthesia. Under ultrasound guidance a 6 Fr Safe-T-Centesis catheter was introduced. Thoracentesis was performed. The catheter was removed and a dressing applied. FINDINGS: A total of approximately 140 mL of hazy yellow fluid was removed. Samples were sent to the laboratory as requested by the clinical team. IMPRESSION: Successful ultrasound guided right thoracentesis yielding 140 mL of pleural fluid. Read by: Ascencion Dike PA-C No pneumothorax on follow-up radiography. Electronically Signed   By: Lucrezia Europe M.D.   On: 01/03/2018 15:16    Assessment and Plan:   New LV systolic dysfunction/Acute systolic heart failure -Admited with shortness of breath and being treated for Pnuemonia.  -Echocardiogram done yesterday showed severely reduced LV systolic function with EF 25-30%, diffuse hypokinesis, grade 1 diastolic dysfunction.  Per the medical history report the patient has a history of a cardiac cath on 01/08/2003 showing nonobstructive mild CAD with normal LV systolic function.  I do not see these records to review. -CXR showed Right pleural effuison.  -The patient does not appear clinically significantly fluid overloaded at this time.  -His cardiomyopathy is  likely non ischemic. Could be related to heavy alcohol use and/or uncontrolled hypertension.  -Would plan for outpatient ischemic workup to evaluate for possible ischemic cause. -Will check thyroid function, liver function -Will initiate guideline directed medical therapy with cardveilol, ARB and lasix. Stop cardizem.  -Will need to follow up outpatient. If he follows up regularly, could consider switching to [facility] in the future.  -Strict I&O, daily wts. Low sodium, heart healthy diet.   Right pleural effusion -Underwent right thoracentesis on 01/03/18 that yielded 140 ml hazy yellow fluid. Exudative.   Lobar pneumonia -Management by IM and ID with IV antibiotics.   Lung nodules - CT guided core lung biopsy of right nodule on 4/11 - inflammation and fibrosis with numerous plasma cells  AKI -Normal renal function at baseline. Scr 0.65 on admission, elevated to 1.80 yesterday. Today down to 1.17.  -Unclear whether related to vancomycin which has now been dc'd.   Prostate cancer  -Was noted to have elevated PSA at [facility] in 2018. Referred to [facility]. Biopsy was done in December 2018.   PET scan done in December did not show evidence of any malignancy outside the prostate itself.   Weakness and confusion -Apparently pt was alert and oriented last week. Today he is intermittently confused with weakness and not participating in activity.  -Being worked up by IM.    For questions or updates, please contact [place] Please consult  [email or web address] for contact info under Cardiology/STEMI.   Signed, Daune Perch, NP  01/06/2018 3:07 PM   History and all data above reviewed.  Patient examined.  I agree with the findings as above.  History as above.  The patient will not answer questions beyond an inaudible whisper or one word answers.  Unable assess whether he has pain or SOB.  He apparently was living by himself and functional prior to this  admission.  Previous cath years ago but no past cardiac history.  No distress currently.  Somnolent The patient exam reveals COR:RRR, positive S3  ,  Lungs: Decreased breath sounds at the bases  ,  Abd: Positive bowel sounds, no rebound no guarding, mild voluntary tenderness, Ext trace pretibial edema  .  All available labs, radiology testing, previous records reviewed. Agree with documented assessment and plan.  Acute systolic HF:  I suspect that this is non ischemic but he will eventually need an ischemia work up.  (Cath or Lexiscan Myoview as an outpatient.)  For now I would continue med titration.  I will change to Coreg and start ARB.  Give IV Lasix for now and will reassess in the AM.    Minus Breeding  3:42 PM  01/06/2018

## 2018-01-06 NOTE — Progress Notes (Signed)
Patient with increased generalized weakness & lethargy.  Patient disoriented, alert to self only.  RN paged [facility] with this information and requested a return call to discuss patient further.

## 2018-01-06 NOTE — Progress Notes (Signed)
Notified Dr. Tawanna Solo that pt's BP is 164/104. Patient received his 1400 scheduled diltizem dose. No new orders at this time.  Also notified Dr. Tawanna Solo that patient has not had a BM since 4/24 and he is complaining of abdominal pain. Abdomen is tender to palpation. Orders received for Mirilax.

## 2018-01-07 ENCOUNTER — Inpatient Hospital Stay (HOSPITAL_COMMUNITY): Payer: Medicare Other

## 2018-01-07 ENCOUNTER — Inpatient Hospital Stay: Payer: Self-pay

## 2018-01-07 DIAGNOSIS — I610 Nontraumatic intracerebral hemorrhage in hemisphere, subcortical: Secondary | ICD-10-CM

## 2018-01-07 DIAGNOSIS — I619 Nontraumatic intracerebral hemorrhage, unspecified: Secondary | ICD-10-CM

## 2018-01-07 LAB — CBC WITH DIFFERENTIAL/PLATELET
BASOS ABS: 0 10*3/uL (ref 0.0–0.1)
Basophils Relative: 0 %
Eosinophils Absolute: 0 10*3/uL (ref 0.0–0.7)
Eosinophils Relative: 0 %
HCT: 32.5 % — ABNORMAL LOW (ref 39.0–52.0)
Hemoglobin: 11.1 g/dL — ABNORMAL LOW (ref 13.0–17.0)
LYMPHS ABS: 1 10*3/uL (ref 0.7–4.0)
Lymphocytes Relative: 4 %
MCH: 25.2 pg — ABNORMAL LOW (ref 26.0–34.0)
MCHC: 34.2 g/dL (ref 30.0–36.0)
MCV: 73.7 fL — ABNORMAL LOW (ref 78.0–100.0)
MONO ABS: 1 10*3/uL (ref 0.1–1.0)
Monocytes Relative: 4 %
Neutro Abs: 23.4 10*3/uL — ABNORMAL HIGH (ref 1.7–7.7)
Neutrophils Relative %: 92 %
PLATELETS: 627 10*3/uL — AB (ref 150–400)
RBC: 4.41 MIL/uL (ref 4.22–5.81)
RDW: 13.1 % (ref 11.5–15.5)
WBC: 25.4 10*3/uL — AB (ref 4.0–10.5)

## 2018-01-07 LAB — HEPATIC FUNCTION PANEL
ALBUMIN: 1.4 g/dL — AB (ref 3.5–5.0)
ALT: 107 U/L — ABNORMAL HIGH (ref 17–63)
AST: 79 U/L — AB (ref 15–41)
Alkaline Phosphatase: 120 U/L (ref 38–126)
Bilirubin, Direct: 0.3 mg/dL (ref 0.1–0.5)
Indirect Bilirubin: 0.4 mg/dL (ref 0.3–0.9)
Total Bilirubin: 0.7 mg/dL (ref 0.3–1.2)
Total Protein: 6.2 g/dL — ABNORMAL LOW (ref 6.5–8.1)

## 2018-01-07 LAB — BASIC METABOLIC PANEL
ANION GAP: 11 (ref 5–15)
BUN: 43 mg/dL — ABNORMAL HIGH (ref 6–20)
CALCIUM: 8.5 mg/dL — AB (ref 8.9–10.3)
CO2: 26 mmol/L (ref 22–32)
Chloride: 95 mmol/L — ABNORMAL LOW (ref 101–111)
Creatinine, Ser: 1.21 mg/dL (ref 0.61–1.24)
Glucose, Bld: 88 mg/dL (ref 65–99)
Potassium: 4.4 mmol/L (ref 3.5–5.1)
Sodium: 132 mmol/L — ABNORMAL LOW (ref 135–145)

## 2018-01-07 LAB — FUNGAL ORGANISM REFLEX

## 2018-01-07 LAB — FUNGUS CULTURE WITH STAIN

## 2018-01-07 LAB — FUNGUS CULTURE RESULT

## 2018-01-07 LAB — MRSA PCR SCREENING: MRSA by PCR: NEGATIVE

## 2018-01-07 MED ORDER — CEFOXITIN SODIUM 2 G IV SOLR
12.0000 g | INTRAVENOUS | Status: DC
  Administered 2018-01-08 – 2018-01-09 (×2): 12 g via INTRAVENOUS
  Filled 2018-01-07 (×2): qty 12

## 2018-01-07 MED ORDER — NICARDIPINE HCL IN NACL 20-0.86 MG/200ML-% IV SOLN
3.0000 mg/h | INTRAVENOUS | Status: DC
  Administered 2018-01-07: 5 mg/h via INTRAVENOUS
  Filled 2018-01-07: qty 200

## 2018-01-07 MED ORDER — PROPOFOL 1000 MG/100ML IV EMUL
INTRAVENOUS | Status: AC
  Filled 2018-01-07: qty 100

## 2018-01-07 MED ORDER — GADOBENATE DIMEGLUMINE 529 MG/ML IV SOLN
10.0000 mL | Freq: Once | INTRAVENOUS | Status: AC
  Administered 2018-01-07: 10 mL via INTRAVENOUS

## 2018-01-07 MED ORDER — METOPROLOL TARTRATE 5 MG/5ML IV SOLN
2.5000 mg | INTRAVENOUS | Status: DC | PRN
  Administered 2018-01-08: 5 mg via INTRAVENOUS
  Filled 2018-01-07: qty 5

## 2018-01-07 MED ORDER — DEXMEDETOMIDINE HCL IN NACL 200 MCG/50ML IV SOLN
0.4000 ug/kg/h | INTRAVENOUS | Status: DC
  Administered 2018-01-07: 0.4 ug/kg/h via INTRAVENOUS
  Filled 2018-01-07: qty 100

## 2018-01-07 MED ORDER — LABETALOL HCL 5 MG/ML IV SOLN
INTRAVENOUS | Status: AC
  Administered 2018-01-07: 10 mg
  Filled 2018-01-07: qty 4

## 2018-01-07 MED ORDER — CEFOXITIN SODIUM 2 G IV SOLR
8.0000 g | Freq: Once | INTRAVENOUS | Status: AC
Start: 2018-01-07 — End: 2018-01-08
  Administered 2018-01-07: 8 g via INTRAVENOUS
  Filled 2018-01-07: qty 8

## 2018-01-07 NOTE — Progress Notes (Signed)
Radiologist called results of CT head as + for 1cm IPH. CT was ordered earlier for pt being lethargic. Per RN, pt is more awake at last check, talking with RN and daughter. BP less than 140 last check. Lovenox and ASA d/c'd. NP called neuro stat and discussed case. Neuro will see pt now. Pt will be moved to ICU for use of Cardene drip to keep SBP below 140. MRI will be added by neuro. Transfer discussed with RN.  KJKG, NP

## 2018-01-07 NOTE — Progress Notes (Signed)
PT Cancellation Note  Patient Details Name: Joell Usman MRN: [id number] DOB: 1950/12/24   Cancelled Treatment:    Reason Eval/Treat Not Completed: Patient at procedure or test/unavailable.  Pt at MRI on first arrival, will try back later as able. 01/07/2018  Donnella Sham, [place] [phone]  (pager)   Tessie Fass Rossi Silvestro 01/07/2018, 11:50 AM

## 2018-01-07 NOTE — Progress Notes (Signed)
Patient is more alert since earlier in the shift.  He was able to lift her arms and legs on command.  He is more talkative and was able to swallow his medications. He is still complaining of stomach pains and was given Miralax in the day shift, no BM since 4/21. I will keep monitoring patient.

## 2018-01-07 NOTE — Consult Note (Signed)
Neurology Consultation  Reason for Consult: [place] Referring Physician: Clance Boll, NP  CC: ICH on CT  History is obtained from: Chart, patient  HPI: [name] is a 67 y.o. male who has a past medical history of coronary artery disease, COPD, prostate cancer, lung nodules, admitted to the hospital since 12/28/2017 for pneumonia. Was admitted prior to 12/28/2017 for a few days and had new symptoms of cough and shortness of breath and imaging findings suggestive of developing pneumonia that prompted this admission on 12/28/2017. He is being currently followed up by ID for the lobar pneumonia.  Also recently found to have new onset CHF-combined systolic and diastolic with ejection fraction in 25 to 30% range, diffuse hypokinesis and grade 1 diastolic dysfunction. He was noted to be altered this morning and a noncontrast CT of the head was ordered.  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  Neurological consultation was placed for the management of this [place]. Patient is not able to provide a lot of history and appears slow to respond to all questions.  He does know that he is in the hospital because he was sick with shortness of breath.  It is unclear as to when his last known normal was but according to the nurse, he has been altered and not like himself since this past weekend.  No family members available at bedside at this time. Patient does not complain of one-sided weakness tingling or numbness.  He does not complain of any headache.  At this current time of encounter, he denied any shortness of breath palpitations cough nausea vomiting.    LKW: At least 2 days ago tpa given?: no, ICH Premorbid modified Rankin scale (mRS): Unable to reliably obtain from the patient ICH score- 0 ROS: ROS was performed and is negative except as noted in the HPI.   Past Medical History:   Diagnosis Date  . BPH with urinary obstruction   . CAD (coronary artery disease)    per cardiac cath 01-08-2003  non-obstructive mild cad w/ normal LVSF  . COPD (chronic obstructive pulmonary disease) ([place])    per pulmologist note in 2009  GOLD 1  . DDD (degenerative disc disease), cervical    C5-7, C7-T1  . Diverticulosis of colon   . Elevated PSA   . GERD (gastroesophageal reflux disease)   . History of adenomatous polyp of colon   . History of hypertension   . History of precordial chest pain    as of 07-22-2017  denies cardiac S&S  . Prostate cancer ([place])   . Wears glasses     Family History  Problem Relation Age of Onset  . Heart disease Brother   . Colon cancer Brother   . Heart disease Sister   . Cancer Sister        unknown  . Cancer Mother        unknown    Social History:   reports that he quit smoking about 30 years ago. His smoking use included cigarettes. He has a 30.00 pack-year smoking history. He quit smokeless tobacco use about 30 years ago. His smokeless tobacco use included snuff. He reports that he does not drink alcohol or use drugs.   Medications  Current Facility-Administered Medications:  .  acetaminophen (TYLENOL) tablet 650 mg, 650 mg, Oral, Q6H PRN, 650 mg at 01/06/18 1819 **OR** acetaminophen (TYLENOL) suppository 650 mg, 650  mg, Rectal, Q6H PRN, Samuella Cota, MD .  alum & mag hydroxide-simeth (MAALOX/MYLANTA) 200-200-20 MG/5ML suspension 30 mL, 30 mL, Oral, Q6H PRN, Robbie Lis, MD .  azithromycin [facility]) 500 mg in sodium chloride 0.9 % 250 mL IVPB, 500 mg, Intravenous, Q24H, Carlyle Basques, MD, Stopped at 01/06/18 1617 .  benzonatate (TESSALON) capsule 200 mg, 200 mg, Oral, TID, Robbie Lis, MD, 200 mg at 01/07/18 0010 .  benzonatate (TESSALON) capsule 200 mg, 200 mg, Oral, BID PRN, Blount, Xenia T, NP .  carvedilol (COREG) tablet 6.25 mg, 6.25 mg, Oral, BID WC, Daune Perch, NP, 6.25 mg at 01/06/18 1819 .  cefOXitin  (MEFOXIN) 8 g in sodium chloride 0.9 % 210 mL continuous infusion, 8 g, Intravenous, Q24H, Hatcher, Jeffrey C, MD .  chlorpheniramine-HYDROcodone (TUSSIONEX) 10-8 MG/5ML suspension 5 mL, 5 mL, Oral, Q12H, Robbie Lis, MD, 5 mL at 01/04/18 1047 .  feeding supplement (ENSURE ENLIVE) (ENSURE ENLIVE) liquid 237 mL, 237 mL, Oral, TID BM, Adhikari, Amrit, MD .  furosemide (LASIX) injection 40 mg, 40 mg, Intravenous, Daily, Daune Perch, NP, 40 mg at 01/06/18 1820 .  guaiFENesin (MUCINEX) 12 hr tablet 600 mg, 600 mg, Oral, BID, Florencia Reasons, MD, 600 mg at 01/07/18 0010 .  ipratropium-albuterol (DUONEB) 0.5-2.5 (3) MG/3ML nebulizer solution 3 mL, 3 mL, Nebulization, Q4H PRN, Adhikari, Amrit, MD .  ipratropium-albuterol (DUONEB) 0.5-2.5 (3) MG/3ML nebulizer solution 3 mL, 3 mL, Nebulization, BID, Adhikari, Amrit, MD, 3 mL at 01/06/18 1951 .  lidocaine (PF) (XYLOCAINE) 1 % injection, , , PRN, Bruning, Kevin, PA-C, 5 mL at 01/03/18 1445 .  losartan (COZAAR) tablet 25 mg, 25 mg, Oral, Daily, Daune Perch, NP, 25 mg at 01/06/18 1819 .  MEDLINE mouth rinse, 15 mL, Mouth Rinse, BID, Florencia Reasons, MD, 15 mL at 01/06/18 1111 .  nicardipine (CARDENE) 20mg  in 0.86% saline 240ml IV infusion (0.1 mg/ml), 3-15 mg/hr, Intravenous, Continuous, Kirby-G[place], Karsten Fells, NP .  ondansetron (ZOFRAN) tablet 4 mg, 4 mg, Oral, Q6H PRN **OR** ondansetron (ZOFRAN) injection 4 mg, 4 mg, Intravenous, Q6H PRN, Samuella Cota, MD, 4 mg at 01/03/18 1712 .  ondansetron (ZOFRAN) injection 4 mg, 4 mg, Intravenous, Q12H, Tommy Medal, Lavell Islam, MD, 4 mg at 01/06/18 2238 .  polyethylene glycol (MIRALAX / GLYCOLAX) packet 17 g, 17 g, Oral, Daily, Adhikari, Amrit, MD, 17 g at 01/06/18 1820 .  sodium chloride flush (NS) 0.9 % injection 3 mL, 3 mL, Intravenous, Q12H, Samuella Cota, MD, 3 mL at 01/07/18 0011 .  [COMPLETED] tigecycline (TYGACIL) 100 mg in sodium chloride 0.9 % 100 mL IVPB, 100 mg, Intravenous, Once, Stopped at 12/29/17 2138  **FOLLOWED BY** tigecycline (TYGACIL) 50 mg in sodium chloride 0.9 % 100 mL IVPB, 50 mg, Intravenous, Q12H, Robbie Lis, MD, Stopped at 01/07/18 0000   Exam: Current vital signs: BP (!) 137/92 (BP Location: Left Arm)   Pulse 68   Temp (!) 97.5 F (36.4 C) (Oral)   Resp 16   Ht 5\' 4"  (1.626 m)   Wt 50.6 kg (111 lb 9.6 oz)   SpO2 100%   BMI 19.16 kg/m  Vital signs in last 24 hours: Temp:  [97.5 F (36.4 C)-98.3 F (36.8 C)] 97.5 F (36.4 C) (04/29 2103) Pulse Rate:  [68-92] 68 (04/29 2103) Resp:  [16-22] 16 (04/29 2103) BP: (137-164)/(92-105) 137/92 (04/29 2103) SpO2:  [95 %-100 %] 100 % (04/29 2103) Weight:  [50.6 kg (111 lb 9.6 oz)] 50.6 kg (111 lb 9.6  oz) (04/29 0555) General: Patient is awake, alert in no apparent distress HEENT: Normocephalic, atraumatic, dry mucous membranes Lungs: Clear to auscultation CVS: S1-S2 heard, regular rate rhythm Extremities: 1+ pitting edema in the left lower extremity Neurologic exam Patient is awake, alert, oriented to get not oriented to time. He is able to repeat, comprehend and name although he is slow to do all of these. Poor attention concentration Speech is clear and not dysarthric. Cranial nerves: Pupils equal round reactive to light, extraocular movements intact, visual fields full, face symmetric, auditory acuity grossly intact, palate elevates in midline, shoulder shrug intact tongue is midline. Motor exam: 4+/5 right upper extremity, 4+/5 right lower extremity, 5/5 left upper and lower extremities.  Moves them spontaneously and to command without any restriction in the range of motion. Sensory exam: Intact to light touch all over Coordination: Intact finger-nose-finger Gait testing was deferred at this time. NIH stroke scale  1a Level of Conscious.: 0 1b LOC Questions: 1 1c LOC Commands: 0 2 Best Gaze: 0 3 Visual: 0 4 Facial Palsy: 0 5a Motor Arm - left: 0 5b Motor Arm - Right: 1 6a Motor Leg - Left: 0 6b Motor Leg -  Right: 1 7 Limb Ataxia: 0 8 Sensory: 0 9 Best Language: 0 10 Dysarthria: 0 11 Extinct. and Inatten.: 0 TOTAL: 3  Labs I have reviewed labs in epic and the results pertinent to this consultation are:  CBC    Component Value Date/Time   WBC 22.5 (H) 01/06/2018 0459   RBC 4.28 01/06/2018 0459   HGB 10.8 (L) 01/06/2018 0459   HCT 31.3 (L) 01/06/2018 0459   PLT 643 (H) 01/06/2018 0459   MCV 73.1 (L) 01/06/2018 0459   MCH 25.2 (L) 01/06/2018 0459   MCHC 34.5 01/06/2018 0459   RDW 13.2 01/06/2018 0459   LYMPHSABS 1.3 12/08/2017 1520   MONOABS 0.8 12/08/2017 1520   EOSABS 0.3 12/08/2017 1520   BASOSABS 0.0 12/08/2017 1520    CMP     Component Value Date/Time   NA 132 (L) 01/06/2018 0459   K 4.7 01/06/2018 0459   CL 100 (L) 01/06/2018 0459   CO2 25 01/06/2018 0459   GLUCOSE 104 (H) 01/06/2018 0459   BUN 42 (H) 01/06/2018 0459   CREATININE 1.17 01/06/2018 0459   CALCIUM 8.2 (L) 01/06/2018 0459   GFRNONAA >60 01/06/2018 0459   GFRAA >60 01/06/2018 0459  TSH 0.383 Ammonia-30  Imaging I have reviewed the images obtained:  CT-scan of the brain-noncontrast CT of the head shows a approximately 10 mm area of hyperdensity in the right thalamus with minimal surrounding edema, no IVH, no midline shift.  Assessment:  67 year old man past history of coronary artery disease, COPD, prostate cancer and lung nodules admitted to the hospital for treatment of lobar pneumonia complicated by pleural effusion, also underwent lung nodule biopsy noted to be lethargic and altered in his mentation over the past weekend.  No clear time of onset of his symptoms but reportedly been more lethargic/altered for the past 2 days. A noncontrast head CT done a few hours ago revealed a small area of ICH in the right thalamus without any IVH or mass-effect. His neurological exam is nonfocal-he is globally encephalopathic. I do not feel that this [place] can completely explain his altered mental status.  I suspect  that this is an incidental finding. That said, my recommendations for the work-up and management of the ICH are listed below.  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  Toxic metabolic encephalopathy  Recommendations: --He was on aspirin 81 mg and Lovenox for DVT prophylaxis.  I have recommended that both of them be discontinued. --Please do not use any antiplatelets or anticoagulants at this time --Systolic blood pressure goal-less than 140.  Might need transfer to the ICU for starting Cardene/Cleviprex drip.  Give him one-time dose of labetalol IV 10 mg for most recent blood pressure at bedside systolic 301. --MRI of the brain with and without contrast to evaluate for underlying vascular malformation versus mets --Check B12 levels --Correction of toxic metabolic derangements per primary team as you are.  Stroke team will continue to follow with you.  -- Amie Portland, MD [facility] Pager: [phone] If 7pm to 7am, please call on call as listed on AMION.

## 2018-01-07 NOTE — Progress Notes (Signed)
Progress Note  Patient Name: [name] Date of Encounter: 01/07/2018  Primary Cardiologist:   No primary care provider on file.   Subjective   Much more awake today.  Denies chest pain or SOB.    Inpatient Medications    Scheduled Meds: . benzonatate  200 mg Oral TID  . carvedilol  6.25 mg Oral BID WC  . chlorpheniramine-HYDROcodone  5 mL Oral Q12H  . feeding supplement (ENSURE ENLIVE)  237 mL Oral TID BM  . furosemide  40 mg Intravenous Daily  . guaiFENesin  600 mg Oral BID  . ipratropium-albuterol  3 mL Nebulization BID  . losartan  25 mg Oral Daily  . mouth rinse  15 mL Mouth Rinse BID  . ondansetron (ZOFRAN) IV  4 mg Intravenous Q12H  . polyethylene glycol  17 g Oral Daily  . sodium chloride flush  3 mL Intravenous Q12H   Continuous Infusions: . azithromycin Stopped (01/06/18 1617)  . cefOXitin (MEFOXIN) continuous infusion 8 g (01/07/18 0827)  . dexmedetomidine (PRECEDEX) IV infusion 0.4 mcg/kg/hr (01/07/18 1108)  . propofol    . tigecycline (TYGACIL) IVPB Stopped (01/07/18 0932)   PRN Meds: acetaminophen **OR** acetaminophen, alum & mag hydroxide-simeth, benzonatate, ipratropium-albuterol, lidocaine (PF), ondansetron **OR** ondansetron (ZOFRAN) IV   Vital Signs    Vitals:   01/07/18 0915 01/07/18 0930 01/07/18 1000 01/07/18 1100  BP: 133/87 (!) 134/100 (!) 134/99 (!) 129/93  Pulse: 83 92 93 98  Resp: 20 (!) 25 (!) 23 (!) 23  Temp:      TempSrc:      SpO2: 96% 95% 96% 97%  Weight:      Height:        Intake/Output Summary (Last 24 hours) at 01/07/2018 1111 Last data filed at 01/07/2018 1100 Gross per 24 hour  Intake 1580.5 ml  Output 3575 ml  Net -1994.5 ml   Filed Weights   01/03/18 0502 01/05/18 0557 01/06/18 0555  Weight: 110 lb 14.4 oz (50.3 kg) 121 lb 0.5 oz (54.9 kg) 111 lb 9.6 oz (50.6 kg)    Telemetry    NSR, sinus tach - Personally Reviewed  ECG    NA - Personally Reviewed  Physical Exam   GEN: No acute distress.   Neck: No   JVD Cardiac: RRR, no murmurs, rubs, or gallops.  Respiratory: Clear  to auscultation bilaterally. GI: Soft, nontender, non-distended  MS: No  edema; No deformity. Neuro:  Nonfocal , generalized weakness Psych:   Agitated  Labs    Chemistry Recent Labs  Lab 01/05/18 0607 01/06/18 0459 01/07/18 0514  NA 132* 132* 132*  K 4.9 4.7 4.4  CL 95* 100* 95*  CO2 26 25 26   GLUCOSE 130* 104* 88  BUN 33* 42* 43*  CREATININE 1.80* 1.17 1.21  CALCIUM 8.4* 8.2* 8.5*  PROT  --   --  6.2*  ALBUMIN  --   --  1.4*  AST  --   --  79*  ALT  --   --  107*  ALKPHOS  --   --  120  BILITOT  --   --  0.7  GFRNONAA 37* >60 >60  GFRAA 43* >60 >60  ANIONGAP 11 7 11      Hematology Recent Labs  Lab 01/05/18 0607 01/06/18 0459 01/07/18 0514  WBC 11.5* 22.5* 25.4*  RBC 4.81 4.28 4.41  HGB 12.3* 10.8* 11.1*  HCT 35.5* 31.3* 32.5*  MCV 73.8* 73.1* 73.7*  MCH 25.6* 25.2* 25.2*  MCHC 34.6  34.5 34.2  RDW 13.0 13.2 13.1  PLT 591* 643* 627*    Cardiac EnzymesNo results for input(s): TROPONINI in the last 168 hours. No results for input(s): TROPIPOC in the last 168 hours.   BNPNo results for input(s): BNP, PROBNP in the last 168 hours.   DDimer No results for input(s): DDIMER in the last 168 hours.   Radiology    Ct Head Wo Contrast  Result Date: 01/07/2018 CLINICAL DATA:  Initial evaluation for acute altered mental status. EXAM: CT HEAD WITHOUT CONTRAST TECHNIQUE: Contiguous axial images were obtained from the base of the skull through the vertex without intravenous contrast. COMPARISON:  Prior CT from 11/29/2017 FINDINGS: Brain: Cerebral volume stable, and remains within normal limits. There is an acute intraparenchymal hemorrhage measuring approximately 1 cm in size positioned at the right thalamus (series 3, image 14). Minimal surrounding edema without significant mass effect. No intraventricular extension or other complication. No other acute intracranial hemorrhage. No acute large vessel  territory infarct. No mass lesion or midline shift. Ventriculomegaly with asymmetric dilatation of the left lateral ventricle, stable. No extra-axial fluid collection. Vascular: No hyperdense vessel. Calcified atherosclerosis at the skull base. Skull: Scalp soft tissues and calvarium within normal limits. Sinuses/Orbits: Globes and orbital soft tissues within normal limits. Moderate opacification of the right ethmoidal air cells. Paranasal sinuses are otherwise clear. Mastoids are largely clear. Other: None. IMPRESSION: 1. Approximate 1 cm acute intraparenchymal hemorrhage centered at the right thalamus without significant edema. 2. Otherwise stable appearance of the brain. Attempt is being made to contact the ordering clinician. Results will be communicated as soon as possible. Electronically Signed   By: Jeannine Boga M.D.   On: 01/07/2018 00:16    Cardiac Studies   ECHO:  01/07/18  Study Conclusions  - Left ventricle: The cavity size was normal. Systolic function was   severely reduced. The estimated ejection fraction was in the   range of 25% to 30%. Diffuse hypokinesis. Doppler parameters are   consistent with abnormal left ventricular relaxation (grade 1   diastolic dysfunction). Doppler parameters are consistent with   elevated ventricular end-diastolic filling pressure. - Mitral valve: There was moderate regurgitation directed centrally   and posteriorly. - Right ventricle: The cavity size was normal. Wall thickness was   normal. Systolic function was normal. - Tricuspid valve: There was trivial regurgitation. - Pulmonary arteries: Systolic pressure was within the normal   range. - Inferior vena cava: The vessel was not visualized. - Pericardium, extracardiac: There was no pericardial effusion.   Patient Profile     67 y.o. male with a hx of prostate cancer, hypertension, GERD, COPD, CAD with mild nonobstructive disease by cath 2004 who is being seen for the evaluation of  CHF and new diagnosis of cardiomyopathy at the request of Dr. Tawanna Solo.   Assessment & Plan    CVA:  ICH right thalamus.  Plans per neuro.    HTN:  Goal is SBP less than 140.  NPO now.  He did get one dose of Coreg this morning before being NPO.  Nicardipine IV if he needs BP control while NPO.    CARDIOMYOPATHY:  New diagnosis without clear etiology but I suspect non ischemic.  Unable to titrate meds while NPO but he seems to be euvolemic.    For questions or updates, please contact [place] Please consult [email or web address] for contact info under Cardiology/STEMI.   Signed, Minus Breeding, MD  01/07/2018, 11:11 AM

## 2018-01-07 NOTE — Progress Notes (Signed)
SLP Cancellation Note  Patient Details Name: [name] MRN: [id number] DOB: 10-31-1950   Cancelled treatment:       Reason Eval/Treat Not Completed: Patient at procedure or test/unavailable   Nekeya Briski, Katherene Ponto 01/07/2018, 10:23 AM

## 2018-01-07 NOTE — Progress Notes (Signed)
INFECTIOUS DISEASE PROGRESS NOTE  ID: [name] is a 67 y.o. male with  Principal Problem:   Lobar pneumonia ([place]) Active Problems:   G E R D   Lung nodules   Hyponatremia   Mycobacterial disease   Pleural effusion   Altered mental status   Protein-calorie malnutrition, severe  Subjective: Slow speech.   Abtx:  Anti-infectives (From admission, onward)   Start     Dose/Rate Route Frequency Ordered Stop   01/07/18 0800  cefOXitin (MEFOXIN) 8 g in sodium chloride 0.9 % 210 mL continuous infusion     8 g 12.5 mL/hr over 20 Hours Intravenous Every 24 hours 01/06/18 1125     01/03/18 1800  cefOXitin (MEFOXIN) 3 g in dextrose 5 % 50 mL IVPB     3 g 100 mL/hr over 30 Minutes Intravenous Every 6 hours 01/03/18 1423 01/07/18 0200   12/30/17 0600  tigecycline (TYGACIL) 50 mg in sodium chloride 0.9 % 100 mL IVPB     50 mg 200 mL/hr over 30 Minutes Intravenous Every 12 hours 12/29/17 1747     12/30/17 0200  tigecycline (TYGACIL) 50 mg in sodium chloride 0.9 % 100 mL IVPB  Status:  Discontinued     50 mg 200 mL/hr over 30 Minutes Intravenous Every 12 hours 12/29/17 1345 12/29/17 1747   12/29/17 1430  cefOXitin (MEFOXIN) 2 g in sodium chloride 0.9 % 100 mL IVPB  Status:  Discontinued     2 g 200 mL/hr over 30 Minutes Intravenous Every 6 hours 12/29/17 1345 01/03/18 1423   12/29/17 1400  azithromycin (ZITHROMAX) 500 mg in sodium chloride 0.9 % 250 mL IVPB     500 mg 250 mL/hr over 60 Minutes Intravenous Every 24 hours 12/29/17 1345     12/29/17 1345  tigecycline (TYGACIL) 100 mg in sodium chloride 0.9 % 100 mL IVPB    Note to Pharmacy:  Please schedule zofran 8mg  iv as anti-emetic prior to infusion   100 mg 200 mL/hr over 30 Minutes Intravenous  Once 12/29/17 1345 12/29/17 2138   12/29/17 0600  vancomycin (VANCOCIN) 500 mg in sodium chloride 0.9 % 100 mL IVPB  Status:  Discontinued     500 mg 100 mL/hr over 60 Minutes Intravenous Every 12 hours 12/28/17 1617 12/29/17 0858   12/28/17 2200  ceFEPIme (MAXIPIME) 1 g in sodium chloride 0.9 % 100 mL IVPB  Status:  Discontinued     1 g 200 mL/hr over 30 Minutes Intravenous Every 8 hours 12/28/17 2123 12/29/17 1342   12/28/17 1645  ceFEPIme (MAXIPIME) 2 g in sodium chloride 0.9 % 100 mL IVPB     2 g 200 mL/hr over 30 Minutes Intravenous  Once 12/28/17 1645 12/28/17 1757   12/28/17 1630  ceFEPIme (MAXIPIME) 1 g in sodium chloride 0.9 % 100 mL IVPB  Status:  Discontinued     1 g 200 mL/hr over 30 Minutes Intravenous  Once 12/28/17 1612 12/28/17 1645   12/28/17 1630  vancomycin (VANCOCIN) IVPB 1000 mg/200 mL premix     1,000 mg 200 mL/hr over 60 Minutes Intravenous  Once 12/28/17 1617 12/28/17 1910      Medications:  Scheduled: . benzonatate  200 mg Oral TID  . carvedilol  6.25 mg Oral BID WC  . chlorpheniramine-HYDROcodone  5 mL Oral Q12H  . feeding supplement (ENSURE ENLIVE)  237 mL Oral TID BM  . furosemide  40 mg Intravenous Daily  . guaiFENesin  600 mg Oral BID  .  ipratropium-albuterol  3 mL Nebulization BID  . losartan  25 mg Oral Daily  . mouth rinse  15 mL Mouth Rinse BID  . ondansetron (ZOFRAN) IV  4 mg Intravenous Q12H  . polyethylene glycol  17 g Oral Daily  . sodium chloride flush  3 mL Intravenous Q12H    Objective: Vital signs in last 24 hours: Temp:  [97.5 F (36.4 C)-98.1 F (36.7 C)] 97.7 F (36.5 C) (04/30 0800) Pulse Rate:  [66-99] 98 (04/30 1100) Resp:  [16-25] 23 (04/30 1100) BP: (114-164)/(75-104) 129/93 (04/30 1100) SpO2:  [92 %-100 %] 97 % (04/30 1100)   General appearance: alert, cooperative and no distress Resp: clear to auscultation bilaterally Cardio: regular rate and rhythm GI: normal findings: bowel sounds normal and soft, non-tender  Lab Results Recent Labs    01/06/18 0459 01/07/18 0514  WBC 22.5* 25.4*  HGB 10.8* 11.1*  HCT 31.3* 32.5*  NA 132* 132*  K 4.7 4.4  CL 100* 95*  CO2 25 26  BUN 42* 43*  CREATININE 1.17 1.21   Liver Panel Recent Labs     01/07/18 0514  PROT 6.2*  ALBUMIN 1.4*  AST 79*  ALT 107*  ALKPHOS 120  BILITOT 0.7  BILIDIR 0.3  IBILI 0.4   Sedimentation Rate No results for input(s): ESRSEDRATE in the last 72 hours. C-Reactive Protein No results for input(s): CRP in the last 72 hours.  Microbiology: Recent Results (from the past 240 hour(s))  MRSA PCR Screening     Status: None   Collection Time: 12/28/17 10:46 PM  Result Value Ref Range Status   MRSA by PCR NEGATIVE NEGATIVE Final    Comment:        The GeneXpert MRSA Assay (FDA approved for NASAL specimens only), is one component of a comprehensive MRSA colonization surveillance program. It is not intended to diagnose MRSA infection nor to guide or monitor treatment for MRSA infections. Performed at [facility], [place] [address], [place], [place] [address]   Culture, blood (routine x 2) Call MD if unable to obtain prior to antibiotics being given     Status: None   Collection Time: 12/28/17 10:54 PM  Result Value Ref Range Status   Specimen Description BLOOD LEFT ARM  Final   Special Requests   Final    BOTTLES DRAWN AEROBIC AND ANAEROBIC Blood Culture results may not be optimal due to an inadequate volume of blood received in culture bottles   Culture   Final    NO GROWTH 5 DAYS Performed at [facility], [place] [address] [place], [place] [address]    Report Status 01/02/2018 FINAL  Final  Culture, blood (routine x 2) Call MD if unable to obtain prior to antibiotics being given     Status: None   Collection Time: 12/28/17 10:54 PM  Result Value Ref Range Status   Specimen Description BLOOD RIGHT HAND  Final   Special Requests   Final    BOTTLES DRAWN AEROBIC AND ANAEROBIC Blood Culture adequate volume   Culture   Final    NO GROWTH 5 DAYS Performed at [facility], [place] [address], [place],  [address]    Report Status 01/02/2018 FINAL  Final  Culture, body fluid-bottle     Status: None (Preliminary result)    Collection Time: 01/03/18  3:04 PM  Result Value Ref Range Status   Specimen Description PLEURAL RIGHT  Final   Special Requests NONE  Final  Culture   Final    NO GROWTH 4 DAYS Performed at [facility], [place] [address] [place], [place] [address]    Report Status PENDING  Incomplete  Culture, blood (Routine X 2) w Reflex to ID Panel     Status: None (Preliminary result)   Collection Time: 01/06/18  2:00 PM  Result Value Ref Range Status   Specimen Description BLOOD RIGHT HAND  Final   Special Requests   Final    BOTTLES DRAWN AEROBIC AND ANAEROBIC Blood Culture adequate volume   Culture   Final    NO GROWTH < 24 HOURS Performed at [facility], [place] [address], [place], [place] [address]    Report Status PENDING  Incomplete  MRSA PCR Screening     Status: None   Collection Time: 01/07/18  2:58 AM  Result Value Ref Range Status   MRSA by PCR NEGATIVE NEGATIVE Final    Comment:        The GeneXpert MRSA Assay (FDA approved for NASAL specimens only), is one component of a comprehensive MRSA colonization surveillance program. It is not intended to diagnose MRSA infection nor to guide or monitor treatment for MRSA infections. Performed at [facility], [place] [address], [place], [place] [address]     Studies/Results: Ct Head Wo Contrast  Result Date: 01/07/2018 CLINICAL DATA:  Initial evaluation for acute altered mental status. EXAM: CT HEAD WITHOUT CONTRAST TECHNIQUE: Contiguous axial images were obtained from the base of the skull through the vertex without intravenous contrast. COMPARISON:  Prior CT from 11/29/2017 FINDINGS: Brain: Cerebral volume stable, and remains within normal limits. There is an acute intraparenchymal hemorrhage measuring approximately 1 cm in size positioned at the right thalamus (series 3, image 14). Minimal surrounding edema without significant mass effect. No intraventricular extension or other complication. No other acute intracranial  hemorrhage. No acute large vessel territory infarct. No mass lesion or midline shift. Ventriculomegaly with asymmetric dilatation of the left lateral ventricle, stable. No extra-axial fluid collection. Vascular: No hyperdense vessel. Calcified atherosclerosis at the skull base. Skull: Scalp soft tissues and calvarium within normal limits. Sinuses/Orbits: Globes and orbital soft tissues within normal limits. Moderate opacification of the right ethmoidal air cells. Paranasal sinuses are otherwise clear. Mastoids are largely clear. Other: None. IMPRESSION: 1. Approximate 1 cm acute intraparenchymal hemorrhage centered at the right thalamus without significant edema. 2. Otherwise stable appearance of the brain. Attempt is being made to contact the ordering clinician. Results will be communicated as soon as possible. Electronically Signed   By: Jeannine Boga M.D.   On: 01/07/2018 00:16     Assessment/Plan: Intra-parenchymal hemorrhage (R thalamus) HTN Non-tuberculous mycobacterial infection Lung nodules CHF  Total days of antibiotics: 9cefoxitin, tigecycline, azithro  Undergoing w/u for his ICH MRI pending suggest increase in WBC from his ICH. Afebrile.  BCx from yesterday pending. His UA does not suggest UTI.          Bobby Rumpf MD, FACP Infectious Diseases (pager) [phone] [email or web address] 01/07/2018, 11:15 AM  LOS: 10 days

## 2018-01-07 NOTE — Consult Note (Signed)
PULMONARY / CRITICAL CARE MEDICINE   Name: [name] MRN: [id number] DOB: [birth year]    ADMISSION DATE:  12/28/2017 CONSULTATION DATE: January 07, 2018   REFERRING MD:  Dr Jerrell Belfast  CHIEF COMPLAINT: Intraparenchymal hemorrhage  HISTORY OF PRESENT ILLNESS:   67 year old male with past medical history as below, which is significant for COPD, nonobstructive coronary artery disease, prostate cancer (Deferred therapy, PET in 08/2107 with no evidence of mets), and hypertension. was recently hospitalized for recent productive cough, shortness of breath, wheezing and chest pain.  A CT showed some new peripheral lung nodules. One of 3 AFB specimens was positive at that time. ID was consulted and recommended DC antibiotics. He had nodule biopsy done 4/11. No organisms identified. Cytology was pending. He was then admitted again to [facility] on 4/20 for worsening cough. He had another culture come back AFB positive, but mTB PCR was negative.  ID was again consulted and have been directing ABX.  Also developed small pleural effusion which was sampled. Found to be an exudate without lymph predominance making it unlikely to be related to NTM infection. He also had an echo done 4/28 which demonstrated severe systolic CHF, which was a new finding at the time (LVEF 25-30%). Around that same time he became lethargic, which persisted for several hours. He underwent contrast CT of the head, which identified a 10 mm acute IPH centered at the right thalamus without significant edema. He was seen by neurology who recommended transfer to ICU for tight BP control. PCCM consulted for medical care in ICU.    PAST MEDICAL HISTORY :  He  has a past medical history of BPH with urinary obstruction, CAD (coronary artery disease), COPD (chronic obstructive pulmonary disease) ([place]), DDD (degenerative disc disease), cervical, Diverticulosis of colon, Elevated PSA, GERD (gastroesophageal reflux disease), History of adenomatous polyp  of colon, History of hypertension, History of precordial chest pain, Prostate cancer ([place]), and Wears glasses.  PAST SURGICAL HISTORY: He  has a past surgical history that includes Colonoscopy (last one 12-14-2015); Cardiac catheterization (01-08-2003   dr Lyndel Safe); Prostate biopsy (N/A, 07/25/2017); and IR THORACENTESIS ASP PLEURAL SPACE W/IMG GUIDE (01/03/2018).  No Known Allergies  No current facility-administered medications on file prior to encounter.    Current Outpatient Medications on File Prior to Encounter  Medication Sig  . acetaminophen (TYLENOL) 500 MG tablet Take 500 mg by mouth every 6 (six) hours as needed for headache (pain).   Marland Kitchen albuterol (PROVENTIL HFA;VENTOLIN HFA) 108 (90 Base) MCG/ACT inhaler Inhale 2 puffs into the lungs every 6 (six) hours as needed for wheezing or shortness of breath.  Marland Kitchen aspirin 81 MG chewable tablet Chew 1 tablet (81 mg total) by mouth daily.  Marland Kitchen dextromethorphan-guaiFENesin (MUCINEX DM) 30-600 MG 12hr tablet Take 1 tablet by mouth 2 (two) times daily.  . Multiple Vitamin (MULTIVITAMIN WITH MINERALS) TABS tablet Take 1 tablet by mouth daily.  Marland Kitchen OVER THE COUNTER MEDICATION Take 1 tablet by mouth 2 (two) times daily. Super Beta Prostate supplement  . OVER THE COUNTER MEDICATION Apply 1 application topically daily as needed (arthritis pain). Over the counter arthritis pain cream  . sildenafil (REVATIO) 20 MG tablet Take 60 mg by mouth daily as needed (erectile dysfunction).   . benzonatate (TESSALON) 100 MG capsule Take 1 capsule (100 mg total) by mouth every 8 (eight) hours. (Patient not taking: Reported on 12/08/2017)  . traMADol (ULTRAM) 50 MG tablet Take 1 tablet (50 mg total) by mouth every 6 (six) hours as  needed. (Patient not taking: Reported on 12/08/2017)    FAMILY HISTORY:  His indicated that his mother is deceased. He indicated that his sister is deceased. He indicated that his brother is deceased.   SOCIAL HISTORY: He  reports that he quit  smoking about 30 years ago. His smoking use included cigarettes. He has a 30.00 pack-year smoking history. He quit smokeless tobacco use about 30 years ago. His smokeless tobacco use included snuff. He reports that he does not drink alcohol or use drugs.  REVIEW OF SYSTEMS:  Limited due to poor mental status   SUBJECTIVE:    VITAL SIGNS: BP 128/90   Pulse 77   Temp 97.8 F (36.6 C) (Oral)   Resp 18   Ht 5\' 4"  (1.626 m)   Wt 50.6 kg (111 lb 9.6 oz)   SpO2 95%   BMI 19.16 kg/m   HEMODYNAMICS:    VENTILATOR SETTINGS:    INTAKE / OUTPUT: I/O last 3 completed shifts: In: 2143 [P.O.:740; I.V.:3; IV Piggyback:1400] Out: 2825 [Urine:2825]  PHYSICAL EXAMINATION: General:  Frail male who appears older than stated age.  Neuro:  Spontaneously awake, alert. One word answers. Oriented at times.  HEENT:  [place]/AT, PERRL, no JVD Cardiovascular:  RRR, no MRG Lungs:  Clear bilateral Abdomen:  Tenderness with even very light palpation. Non-distended. Soft.  Musculoskeletal:  Frail  Skin: Grossly intact  LABS:  BMET Recent Labs  Lab 01/05/18 0607 01/06/18 0459 01/07/18 0514  NA 132* 132* 132*  K 4.9 4.7 4.4  CL 95* 100* 95*  CO2 26 25 26   BUN 33* 42* 43*  CREATININE 1.80* 1.17 1.21  GLUCOSE 130* 104* 88    Electrolytes Recent Labs  Lab 01/05/18 0607 01/06/18 0459 01/07/18 0514  CALCIUM 8.4* 8.2* 8.5*  MG 2.0  --   --     CBC Recent Labs  Lab 01/05/18 0607 01/06/18 0459 01/07/18 0514  WBC 11.5* 22.5* 25.4*  HGB 12.3* 10.8* 11.1*  HCT 35.5* 31.3* 32.5*  PLT 591* 643* 627*    Coag's No results for input(s): APTT, INR in the last 168 hours.  Sepsis Markers No results for input(s): LATICACIDVEN, PROCALCITON, O2SATVEN in the last 168 hours.  ABG Recent Labs  Lab 01/06/18 1530  PHART 7.471*  PCO2ART 33.8  PO2ART 84.4    Liver Enzymes Recent Labs  Lab 01/07/18 0514  AST 79*  ALT 107*  ALKPHOS 120  BILITOT 0.7  ALBUMIN 1.4*    Cardiac  Enzymes No results for input(s): TROPONINI, PROBNP in the last 168 hours.  Glucose No results for input(s): GLUCAP in the last 168 hours.  Imaging Ct Head Wo Contrast  Result Date: 01/07/2018 CLINICAL DATA:  Initial evaluation for acute altered mental status. EXAM: CT HEAD WITHOUT CONTRAST TECHNIQUE: Contiguous axial images were obtained from the base of the skull through the vertex without intravenous contrast. COMPARISON:  Prior CT from 11/29/2017 FINDINGS: Brain: Cerebral volume stable, and remains within normal limits. There is an acute intraparenchymal hemorrhage measuring approximately 1 cm in size positioned at the right thalamus (series 3, image 14). Minimal surrounding edema without significant mass effect. No intraventricular extension or other complication. No other acute intracranial hemorrhage. No acute large vessel territory infarct. No mass lesion or midline shift. Ventriculomegaly with asymmetric dilatation of the left lateral ventricle, stable. No extra-axial fluid collection. Vascular: No hyperdense vessel. Calcified atherosclerosis at the skull base. Skull: Scalp soft tissues and calvarium within normal limits. Sinuses/Orbits: Globes and orbital soft tissues within  normal limits. Moderate opacification of the right ethmoidal air cells. Paranasal sinuses are otherwise clear. Mastoids are largely clear. Other: None. IMPRESSION: 1. Approximate 1 cm acute intraparenchymal hemorrhage centered at the right thalamus without significant edema. 2. Otherwise stable appearance of the brain. Attempt is being made to contact the ordering clinician. Results will be communicated as soon as possible. Electronically Signed   By: Jeannine Boga M.D.   On: 01/07/2018 00:16     STUDIES:  CT head 4/29 > 10 mm acute IPH centered at the right thalamus without significant edema. Echo 4/29 > The cavity size was normal. Systolic function was severely reduced. The estimated ejection fraction was in the  range of 25% to 30%. Diffuse hypokinesis. Doppler parameters are consistent with abnormal left ventricular relaxation (grade 1 diastolic dysfunction). Moderate MR.  CULTURES: Blood 4/20: neg Pleural fluid culture 4/26 > Pleural fluid acid fast smear 4/26 > Pleural fluid fungal culture 4/26 >  Blood culture 4/29 >  ANTIBIOTICS: Cefoxitin Tigecycline Azitrhomycin  SIGNIFICANT EVENTS: 4/20 admit 4/26 thoracentesis (exudate, no lymph prominence) 4/29 Card consult, new HF noted on echo 4/29 lethargy. Head CT showed small IPH. Transferred to ICU for antiHTN gtt.   LINES/TUBES:   DISCUSSION: 67 year old male with recent eval for pulmonary infection initially concerning for TB, now being treated for NTM. Admitted 4/20 for lobar PNA. ABX directed by ID. Pleural effusion was exudate, not lymph predominant. Course now complicated by new onset CHF with EF 25-30%. Also 4/29 he developed some lethargy and was found to have small ICH.   ASSESSMENT / PLAN:  Lobar pneumonia  Non-TB mycobacterial pulmonary infection - ABX per ID consultants - Pleural fluid cultures pending.  - Supplemental O2 PRN.  - Incentive spirometry  Right thalamic IPH 29mm. - Neurology following - Swallow eval  Acute encephalopathy out of proportion to [place] - Supportive care  Chronic systolic CHF (LVEF 95-63%). Diastolic dysfunction grade 1 as well on echo Hypertension - cardiology following - continue  Losartan, lasix, coreg per cards if he is able to swallow - Will add PRN metoprolol IV - DC nicardipine   Asthma/COPD without acute exacerbation - Scheduled, PRN nebs - No role steroids  Abdominal Pain - US abdomen  Transaminitis - follow LFT  Prostate Cancer Dx one year ago. Has deferred treatment. PET in 08/2017 shows no mets.  -Outpatient follow up at the patient's discretion.    DVT: holding chemoprophylaxis. SCDs only Diet: NPO pending SLP Code: Full Dispo: ICU, can likely move to SDU today.     FAMILY  - Updates: Daughter updated 4/30  - Inter-disciplinary family meet or Palliative Care meeting due by: 5/6   Georgann Housekeeper, AGACNP-BC [place] Pulmon[facility]/Critical Care Pager [phone] or [phone]  01/07/2018 8:47 AM

## 2018-01-07 NOTE — Progress Notes (Signed)
Cardene gtt initiated at 5 mg/hr for goal SBP < 140.

## 2018-01-07 NOTE — Progress Notes (Signed)
PT Cancellation Note  Patient Details Name: [name] MRN: [id number] DOB: Feb 25, 1951   Cancelled Treatment:    Reason Eval/Treat Not Completed: Medical issues which prohibited therapy.  Pt sedated this pm and unable to participate. 01/07/2018  Donnella Sham, PT [phone]  (pager)   Tessie Fass Daryll Spisak 01/07/2018, 3:16 PM

## 2018-01-07 NOTE — Plan of Care (Signed)
  Problem: Self-Care: Goal: Ability to communicate needs accurately will improve Outcome: Not Progressing  Pt slow to respond to questions  Problem: Nutrition: Goal: Risk of aspiration will decrease Outcome: Not Progressing Goal: Dietary intake will improve Outcome: Not Progressing  Pt remains NPO, awaiting swallow eval  Problem: Intracerebral Hemorrhage Tissue Perfusion: Goal: Complications of Intracerebral Hemorrhage will be minimized Outcome: Progressing Pt moving all 4 extremities, with the right side weaker than the left.

## 2018-01-07 NOTE — Progress Notes (Signed)
Stroke Team Progress Note  [name] is a 67 y.o. male who has a past medical history of coronary artery disease, COPD, prostate cancer, lung nodules, admitted to the hospital since 12/28/2017 for pneumonia. Was admitted prior to 12/28/2017 for a few days and had new symptoms of cough and shortness of breath and imaging findings suggestive of developing pneumonia that prompted this admission on 12/28/2017. He is being currently followed up by ID for the lobar pneumonia.  Also recently found to have new onset CHF-combined systolic and diastolic with ejection fraction in 25 to 30% range, diffuse hypokinesis and grade 1 diastolic dysfunction. He was noted to be altered this morning and a noncontrast CT of the head was ordered.  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  Neurological consultation was placed for the management of this Mora. Patient is not able to provide a lot of history and appears slow to respond to all questions.  He does know that he is in the hospital because he was sick with shortness of breath.  It is unclear as to when his last known normal was but according to the nurse, he has been altered and not like himself since this past weekend.  No family members available at bedside at this time. Patient does not complain of one-sided weakness tingling or numbness.  He does not complain of any headache.  At this current time of encounter, he denied any shortness of breath palpitations cough nausea vomiting. LKW: At least 2 days ago tpa given?: no, ICH Premorbid modified Rankin scale (mRS): Unable to reliably obtain from the patient ICH score- 0   SUBJECTIVE  the patient's mental status appears to be improving is more interactive today but continues to have expressive aphasia and right hemiparesis. Blood pressure is adequately controlled.  OBJECTIVE Most recent Vital  Signs: Temp: 96.6 F (35.9 C) (04/30 1200) Temp Source: Axillary (04/30 1200) BP: 110/93 (04/30 1400) Pulse Rate: 74 (04/30 1400) Respiratory Rate: 18 O2 Saturdation: 97%  CBG (last 3)  No results for input(s): GLUCAP in the last 72 hours.  Diet:  Diet Order           Diet NPO time specified  Diet effective now           Activity: bedrest VTE Prophylaxis:  SCDs  Studies: Results for orders placed or performed during the hospital encounter of 12/28/17 (from the past 24 hour(s))  Urinalysis, Routine w reflex microscopic     Status: Abnormal   Collection Time: 01/06/18  2:54 PM  Result Value Ref Range   Color, Urine YELLOW YELLOW   APPearance CLEAR CLEAR   Specific Gravity, Urine 1.023 1.005 - 1.030   pH 5.0 5.0 - 8.0   Glucose, UA NEGATIVE NEGATIVE mg/dL   Hgb urine dipstick MODERATE (A) NEGATIVE   Bilirubin Urine NEGATIVE NEGATIVE   Ketones, ur 5 (A) NEGATIVE mg/dL   Protein, ur NEGATIVE NEGATIVE mg/dL   Nitrite NEGATIVE NEGATIVE   Leukocytes, UA TRACE (A) NEGATIVE   RBC / HPF 11-20 0 - 5 RBC/hpf   WBC, UA 11-20 0 - 5 WBC/hpf   Bacteria, UA RARE (A) NONE SEEN   Squamous Epithelial / LPF 0-5 0 - 5   Mucus PRESENT    Hyaline Casts, UA PRESENT    Sperm, UA PRESENT   Blood gas, arterial     Status: Abnormal  Collection Time: 01/06/18  3:30 PM  Result Value Ref Range   pH, Arterial 7.471 (H) 7.350 - 7.450   pCO2 arterial 33.8 32.0 - 48.0 mmHg   pO2, Arterial 84.4 83.0 - 108.0 mmHg   Bicarbonate 24.3 20.0 - 28.0 mmol/L   Acid-Base Excess 1.0 0.0 - 2.0 mmol/L   O2 Saturation 96.0 %   Patient temperature 98.6    Collection site LEFT RADIAL    Drawn by [phone]    Sample type ARTERIAL DRAW    Allens test (pass/fail) PASS PASS  TSH     Status: None   Collection Time: 01/06/18  5:09 PM  Result Value Ref Range   TSH 0.383 0.350 - 4.500 uIU/mL  MRSA PCR Screening     Status: None   Collection Time: 01/07/18  2:58 AM  Result Value Ref Range   MRSA by PCR NEGATIVE  NEGATIVE  CBC with Differential/Platelet     Status: Abnormal   Collection Time: 01/07/18  5:14 AM  Result Value Ref Range   WBC 25.4 (H) 4.0 - 10.5 K/uL   RBC 4.41 4.22 - 5.81 MIL/uL   Hemoglobin 11.1 (L) 13.0 - 17.0 g/dL   HCT 32.5 (L) 39.0 - 52.0 %   MCV 73.7 (L) 78.0 - 100.0 fL   MCH 25.2 (L) 26.0 - 34.0 pg   MCHC 34.2 30.0 - 36.0 g/dL   RDW 13.1 11.5 - 15.5 %   Platelets 627 (H) 150 - 400 K/uL   Neutrophils Relative % 92 %   Lymphocytes Relative 4 %   Monocytes Relative 4 %   Eosinophils Relative 0 %   Basophils Relative 0 %   Neutro Abs 23.4 (H) 1.7 - 7.7 K/uL   Lymphs Abs 1.0 0.7 - 4.0 K/uL   Monocytes Absolute 1.0 0.1 - 1.0 K/uL   Eosinophils Absolute 0.0 0.0 - 0.7 K/uL   Basophils Absolute 0.0 0.0 - 0.1 K/uL   Smear Review MORPHOLOGY UNREMARKABLE   Basic metabolic panel     Status: Abnormal   Collection Time: 01/07/18  5:14 AM  Result Value Ref Range   Sodium 132 (L) 135 - 145 mmol/L   Potassium 4.4 3.5 - 5.1 mmol/L   Chloride 95 (L) 101 - 111 mmol/L   CO2 26 22 - 32 mmol/L   Glucose, Bld 88 65 - 99 mg/dL   BUN 43 (H) 6 - 20 mg/dL   Creatinine, Ser 1.21 0.61 - 1.24 mg/dL   Calcium 8.5 (L) 8.9 - 10.3 mg/dL   GFR calc non Af Amer >60 >60 mL/min   GFR calc Af Amer >60 >60 mL/min   Anion gap 11 5 - 15  Hepatic function panel     Status: Abnormal   Collection Time: 01/07/18  5:14 AM  Result Value Ref Range   Total Protein 6.2 (L) 6.5 - 8.1 g/dL   Albumin 1.4 (L) 3.5 - 5.0 g/dL   AST 79 (H) 15 - 41 U/L   ALT 107 (H) 17 - 63 U/L   Alkaline Phosphatase 120 38 - 126 U/L   Total Bilirubin 0.7 0.3 - 1.2 mg/dL   Bilirubin, Direct 0.3 0.1 - 0.5 mg/dL   Indirect Bilirubin 0.4 0.3 - 0.9 mg/dL     Ct Head Wo Contrast  Result Date: 01/07/2018 CLINICAL DATA:  Initial evaluation for acute altered mental status. EXAM: CT HEAD WITHOUT CONTRAST TECHNIQUE: Contiguous axial images were obtained from the base of the skull through the vertex without intravenous contrast.  COMPARISON:  Prior CT from 11/29/2017 FINDINGS: Brain: Cerebral volume stable, and remains within normal limits. There is an acute intraparenchymal hemorrhage measuring approximately 1 cm in size positioned at the right thalamus (series 3, image 14). Minimal surrounding edema without significant mass effect. No intraventricular extension or other complication. No other acute intracranial hemorrhage. No acute large vessel territory infarct. No mass lesion or midline shift. Ventriculomegaly with asymmetric dilatation of the left lateral ventricle, stable. No extra-axial fluid collection. Vascular: No hyperdense vessel. Calcified atherosclerosis at the skull base. Skull: Scalp soft tissues and calvarium within normal limits. Sinuses/Orbits: Globes and orbital soft tissues within normal limits. Moderate opacification of the right ethmoidal air cells. Paranasal sinuses are otherwise clear. Mastoids are largely clear. Other: None. IMPRESSION: 1. Approximate 1 cm acute intraparenchymal hemorrhage centered at the right thalamus without significant edema. 2. Otherwise stable appearance of the brain. Attempt is being made to contact the ordering clinician. Results will be communicated as soon as possible. Electronically Signed   By: Jeannine Boga M.D.   On: 01/07/2018 00:16   Mr Jodene Nam Head Wo Contrast  Result Date: 01/07/2018 CLINICAL DATA:  Cerebral hemorrhage suspected EXAM: MRI HEAD WITHOUT CONTRAST MRA HEAD WITHOUT CONTRAST TECHNIQUE: Multiplanar, multiecho pulse sequences of the brain and surrounding structures were obtained without intravenous contrast. Angiographic images of the head were obtained using MRA technique without contrast. COMPARISON:  Head CT from yesterday FINDINGS: MRI HEAD FINDINGS Brain: Multiple small acute infarcts that are rounded and linear, seen about the lateral ventricles, juxta cortical white matter, and deep gray nuclei-especially the thalami. These are consistent with acute infarcts,  possibly embolic. No hemorrhage. Ventriculomegaly with asymmetric lateral ventricular size, larger on the left, likely developmental and stable since at least 2014. FLAIR hyperintensity around the lateral ventricles is also stable from 2014 and attributed to chronic small vessel ischemia. Known acute infarct in the posterior right thalamus, likely hemorrhagic conversion. No masslike enhancement or abnormal regional vessels. No detected progression since prior. Vascular: Normal flow voids and vascular enhancements. 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 Atheromatous irregularity of the left V4 and mid basilar. 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 No flow limiting stenosis or branch occlusions seen in the anterior circulation. Negative for beading or aneurysm. 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 There are large bilateral posterior communicating arteries. IMPRESSION: 1. Scattered small acute infarcts in the bilateral cerebral white matter and thalami primarily. The small right thalamic hematoma is likely hemorrhagic conversion. 2. No emergent finding on intracranial MRA. 3. Atheromatous irregularity of the left V4 segment and basilar with high-grade mid basilar narrowing 4. Chronic ventriculomegaly and asymmetric left lateral ventricle dilatation. Chronic small vessel ischemia. Electronically Signed   By: Monte Fantasia M.D.   On: 01/07/2018 13:27   Mr Jeri Cos GE Contrast  Result Date: 01/07/2018 CLINICAL DATA:  Cerebral hemorrhage suspected EXAM: MRI HEAD WITHOUT CONTRAST MRA HEAD WITHOUT CONTRAST TECHNIQUE: Multiplanar, multiecho pulse sequences of the brain and surrounding structures were obtained without intravenous contrast. Angiographic images  of the head were obtained using MRA technique without contrast. COMPARISON:  Head CT from yesterday FINDINGS: MRI HEAD FINDINGS Brain: Multiple small acute infarcts that are rounded and linear, seen about the lateral ventricles, juxta cortical white matter, and deep gray nuclei-especially the thalami. These are  consistent with acute infarcts, possibly embolic. No hemorrhage. Ventriculomegaly with asymmetric lateral ventricular size, larger on the left, likely developmental and stable since at least 2014. FLAIR hyperintensity around the lateral ventricles is also stable from 2014 and attributed to chronic small vessel ischemia. Known acute infarct in the posterior right thalamus, likely hemorrhagic conversion. No masslike enhancement or abnormal regional vessels. No detected progression since prior. Vascular: Normal flow voids and vascular enhancements. 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 Atheromatous irregularity of the left V4 and mid basilar. 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 No flow limiting stenosis or branch occlusions seen in the anterior circulation. Negative for beading or aneurysm. 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 There are large bilateral posterior communicating arteries. IMPRESSION: 1. Scattered small acute infarcts in the bilateral cerebral white matter and thalami primarily. The small right thalamic hematoma is likely hemorrhagic conversion. 2. No emergent finding on intracranial MRA. 3. Atheromatous irregularity of the left V4 segment and basilar with high-grade mid basilar narrowing 4. Chronic ventriculomegaly and asymmetric left lateral ventricle dilatation. Chronic small vessel ischemia. Electronically Signed   By: Monte Fantasia M.D.   On: 01/07/2018 13:27   US Abdomen Complete  Result Date: 01/07/2018 CLINICAL DATA:  Abdominal pain EXAM: ABDOMEN ULTRASOUND COMPLETE COMPARISON:  Abdominal and pelvic CT scan of August 26 2017 FINDINGS: Gallbladder: The gallbladder is adequately distended. It contains echogenic sludge. There is no gallbladder wall thickening or positive sonographic Murphy's sign. There is a small amount of pericholecystic fluid but no gallbladder wall thickening. Common bile duct: Diameter: 5 mm Liver: The hepatic echotexture is normal. There is a cyst in the right lobe measuring 1.7 x 1.6 x 1.3 cm which appears stable. There is no intrahepatic ductal dilation. The surface contour of the liver is normal. Portal vein is patent on color Doppler imaging with normal direction of blood flow towards the liver. IVC: No abnormality visualized. Pancreas: Visualization of the pancreatic head and tail is limited. The pancreatic body is normal. Spleen: Size and appearance within normal limits. Right Kidney: Length: 10.6 cm. There is a marked small amount of perinephric fluid. The renal cortical echotexture is similar to that of the adjacent liver. There is mild hydronephrosis. Left Kidney: Length: 9.4 cm. There is a small amount of perinephric fluid on the left. The renal cortical echotexture is increased similar to that on the right. There is no definite hydronephrosis. Abdominal aorta: No aneurysm visualized. Other findings: There is ascites. There are bilateral pleural effusions. IMPRESSION: Ascites including pericholecystic fluid and perinephric fluid. Bilateral pleural effusions. Mildly distended gallbladder containing sludge. No sonographic evidence of acute cholecystitis. Normal hepatic echotexture. Stable simple appearing right lobe cyst measuring 1.7 cm in greatest dimension. Increased renal cortical echotexture bilaterally consistent with medical renal disease. Mild right-sided hydronephrosis. Electronically Signed    By: David  Martinique M.D.   On: 01/07/2018 12:02    Physical Exam:    Will middle-aged African-American male currently not in distress. . Afebrile. Head is nontraumatic. Neck is supple without bruit.    Cardiac exam no murmur or gallop. Lungs are clear to auscultation. Distal pulses are well felt. Neurological Exam :  Awake alert oriented to person only. Nonfluent speech with word hesitancy and can speak only short sentences and occasional words.Diminished naming and  repetition. Seems to comprehend better. Extraocular moments are full range without nystagmus. Blinks to threat bilaterally.right lower facial weakness. Tongue midline. Motor system exam shows mild right hemiparesis 4/5 strength. Right upper and lower extremity drift. Weakness of right grip and intrinsic hand muscles. Most left side purposefully against gravity. Sensation appears preserved bilaterally. Deep tendon reflexes are diminished on the right normal on the left. Right plantar upgoing left downgoing. Gait not tested.  ASSESSMENT Mr. [name] is a 67 y.o. male with  Altered mental status over the last few days with exam suggestive of mild expressive aphasia and right hemiparesis but CT scan showing right thalamic hemorrhage which is small and likely incidental and cannot explain his presentation. Hospital day # 10  TREATMENT/PLAN  recommend check MRI scan of the brain with and without contrast given his history of pneumonia and malignancy as well as check MRA of the brain.Strict blood pressure control with systolic blood pressure goal below 140 for 24 hours followed by the low 180 later. Patient doesn't not have significantly elevated blood pressure upon admission hence etiology of his hemorrhage may not be hypertensive. Further evaluation will depend upon MRI findings. Long discussion the bedside with Dr. Marolyn Hammock pulmonary critical care medicine and answered questions. No family available at the bedside for discussion. This  patient is critically ill and at significant risk of neurological worsening, death and care requires constant monitoring of vital signs, hemodynamics,respiratory and cardiac monitoring, extensive review of multiple databases, frequent neurological assessment, discussion with family, other specialists and medical decision making of high complexity.I have made any additions or clarifications directly to the above note.This critical care time does not reflect procedure time, or teaching time or supervisory time of PA/NP/Med Resident etc but could involve care discussion time.  I spent 30 minutes of neurocritical care time  in the care of  this patient.      Antony Contras, MD [facility] Pager: [phone] 01/07/2018 2:07 PM

## 2018-01-07 NOTE — Progress Notes (Signed)
OT Cancellation Note  Patient Details Name: [name] MRN: [id number] DOB: [birth year]   Cancelled Treatment:    Reason Eval/Treat Not Completed: Patient at procedure or test/ unavailable(MRI)  Parke Poisson B 01/07/2018, 12:40 PM

## 2018-01-07 NOTE — Progress Notes (Signed)
Labetolol 10 mg IV given.

## 2018-01-08 DIAGNOSIS — I10 Essential (primary) hypertension: Secondary | ICD-10-CM

## 2018-01-08 DIAGNOSIS — R630 Anorexia: Secondary | ICD-10-CM

## 2018-01-08 DIAGNOSIS — R000 Tachycardia, unspecified: Secondary | ICD-10-CM

## 2018-01-08 DIAGNOSIS — I2510 Atherosclerotic heart disease of native coronary artery without angina pectoris: Secondary | ICD-10-CM

## 2018-01-08 DIAGNOSIS — I110 Hypertensive heart disease with heart failure: Secondary | ICD-10-CM

## 2018-01-08 DIAGNOSIS — R0682 Tachypnea, not elsewhere classified: Secondary | ICD-10-CM

## 2018-01-08 DIAGNOSIS — I619 Nontraumatic intracerebral hemorrhage, unspecified: Secondary | ICD-10-CM

## 2018-01-08 DIAGNOSIS — N179 Acute kidney failure, unspecified: Secondary | ICD-10-CM

## 2018-01-08 DIAGNOSIS — D72829 Elevated white blood cell count, unspecified: Secondary | ICD-10-CM

## 2018-01-08 DIAGNOSIS — I509 Heart failure, unspecified: Secondary | ICD-10-CM

## 2018-01-08 DIAGNOSIS — I610 Nontraumatic intracerebral hemorrhage in hemisphere, subcortical: Secondary | ICD-10-CM

## 2018-01-08 DIAGNOSIS — D62 Acute posthemorrhagic anemia: Secondary | ICD-10-CM

## 2018-01-08 DIAGNOSIS — E871 Hypo-osmolality and hyponatremia: Secondary | ICD-10-CM

## 2018-01-08 DIAGNOSIS — R188 Other ascites: Secondary | ICD-10-CM

## 2018-01-08 DIAGNOSIS — J90 Pleural effusion, not elsewhere classified: Secondary | ICD-10-CM

## 2018-01-08 LAB — ACID FAST SMEAR (AFB, MYCOBACTERIA): Acid Fast Smear: NEGATIVE

## 2018-01-08 LAB — CULTURE, BODY FLUID W GRAM STAIN -BOTTLE: Culture: NO GROWTH

## 2018-01-08 LAB — CULTURE, BODY FLUID-BOTTLE

## 2018-01-08 MED ORDER — LINEZOLID 600 MG/300ML IV SOLN
600.0000 mg | Freq: Two times a day (BID) | INTRAVENOUS | Status: DC
  Administered 2018-01-08 – 2018-01-09 (×4): 600 mg via INTRAVENOUS
  Filled 2018-01-08 (×5): qty 300

## 2018-01-08 MED ORDER — HYDRALAZINE HCL 20 MG/ML IJ SOLN
10.0000 mg | INTRAMUSCULAR | Status: DC | PRN
  Administered 2018-01-08 – 2018-01-11 (×2): 10 mg via INTRAVENOUS
  Filled 2018-01-08 (×3): qty 1

## 2018-01-08 MED ORDER — LOSARTAN POTASSIUM 50 MG PO TABS
50.0000 mg | ORAL_TABLET | Freq: Every day | ORAL | Status: DC
  Filled 2018-01-08: qty 1

## 2018-01-08 MED ORDER — CARVEDILOL 12.5 MG PO TABS
12.5000 mg | ORAL_TABLET | Freq: Two times a day (BID) | ORAL | Status: DC
  Administered 2018-01-08: 12.5 mg via ORAL
  Filled 2018-01-08 (×2): qty 1

## 2018-01-08 NOTE — Progress Notes (Signed)
Patient is received to room 4NP14 and is alert and oriented X2. Patient is hooked up to cardiac monitor and BP is taken. Bedside report received by ICU nurse. Patient daughter Janett Billow is at the bedside and is updated on transfer. Janett Billow number is [phone]. Pt is on room air with no sob or coughing noted. Patient denies pain or nausea. Patient daughter orientated to room and call system. Patient bed alarm is on for safety.

## 2018-01-08 NOTE — Progress Notes (Signed)
Stroke Team Progress Note     SUBJECTIVE  .there are multiple family members at the bedside.the patient's mental status appears to be improving is more interactive today but continues to have expressive aphasia and right hemiparesis. Blood pressure is adequately controlled.MRI scan of the brain shows multiple embolic infarcts bilaterally including the hemorrhagic right thalamic infarct  OBJECTIVE Most recent Wheeler Signs: Temp: 97.6 F (36.4 C) (05/01 1300) Temp Source: Oral (05/01 1300) BP: 133/98 (05/01 1300) Pulse Rate: 86 (05/01 1300) Respiratory Rate: (!) 21 O2 Saturdation: 94%  CBG (last 3)  No results for input(s): GLUCAP in the last 72 hours.  Diet:  Diet Order           DIET DYS 2 Room service appropriate? Yes; Fluid consistency: Thin  Diet effective now           Activity: bedrest VTE Prophylaxis:  SCDs  Studies: No results found for this or any previous visit (from the past 24 hour(s)).   Ct Head Wo Contrast  Result Date: 01/07/2018 CLINICAL DATA:  Initial evaluation for acute altered mental status. EXAM: CT HEAD WITHOUT CONTRAST TECHNIQUE: Contiguous axial images were obtained from the base of the skull through the vertex without intravenous contrast. COMPARISON:  Prior CT from 11/29/2017 FINDINGS: Brain: Cerebral volume stable, and remains within normal limits. There is an acute intraparenchymal hemorrhage measuring approximately 1 cm in size positioned at the right thalamus (series 3, image 14). Minimal surrounding edema without significant mass effect. No intraventricular extension or other complication. No other acute intracranial hemorrhage. No acute large vessel territory infarct. No mass lesion or midline shift. Ventriculomegaly with asymmetric dilatation of the left lateral ventricle, stable. No extra-axial fluid collection. Vascular: No hyperdense vessel. Calcified atherosclerosis at the skull base. Skull: Scalp soft tissues and calvarium within normal limits.  Sinuses/Orbits: Globes and orbital soft tissues within normal limits. Moderate opacification of the right ethmoidal air cells. Paranasal sinuses are otherwise clear. Mastoids are largely clear. Other: None. IMPRESSION: 1. Approximate 1 cm acute intraparenchymal hemorrhage centered at the right thalamus without significant edema. 2. Otherwise stable appearance of the brain. Attempt is being made to contact the ordering clinician. Results will be communicated as soon as possible. Electronically Signed   By: Jeannine Boga M.D.   On: 01/07/2018 00:16   Mr Jodene Nam Head Wo Contrast  Result Date: 01/07/2018 CLINICAL DATA:  Cerebral hemorrhage suspected EXAM: MRI HEAD WITHOUT CONTRAST MRA HEAD WITHOUT CONTRAST TECHNIQUE: Multiplanar, multiecho pulse sequences of the brain and surrounding structures were obtained without intravenous contrast. Angiographic images of the head were obtained using MRA technique without contrast. COMPARISON:  Head CT from yesterday FINDINGS: MRI HEAD FINDINGS Brain: Multiple small acute infarcts that are rounded and linear, seen about the lateral ventricles, juxta cortical white matter, and deep gray nuclei-especially the thalami. These are consistent with acute infarcts, possibly embolic. No hemorrhage. Ventriculomegaly with asymmetric lateral ventricular size, larger on the left, likely developmental and stable since at least 2014. FLAIR hyperintensity around the lateral ventricles is also stable from 2014 and attributed to chronic small vessel ischemia. Known acute infarct in the posterior right thalamus, likely hemorrhagic conversion. No masslike enhancement or abnormal regional vessels. No detected progression since prior. Vascular: Normal flow voids and vascular enhancements. Skull and upper cervical spine: Advanced cervical facet arthropathy with C4-5 anterolisthesis, known from 11/29/2017 cervical spine CT Sinuses/Orbits: Partial bilateral mastoid opacification MRA HEAD FINDINGS  Symmetric carotid and vertebral arteries. Atheromatous irregularity of the left V4 and mid basilar.  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 No flow limiting stenosis or branch occlusions seen in the anterior circulation. Negative for beading or aneurysm. 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 There are large bilateral posterior communicating arteries. IMPRESSION: 1. Scattered small acute infarcts in the bilateral cerebral white matter and thalami primarily. The small right thalamic hematoma is likely hemorrhagic conversion. 2. No emergent finding on intracranial MRA. 3. Atheromatous irregularity of the left V4 segment and basilar with high-grade mid basilar narrowing 4. Chronic ventriculomegaly and asymmetric left lateral ventricle dilatation. Chronic small vessel ischemia. Electronically Signed   By: Monte Fantasia M.D.   On: 01/07/2018 13:27   Mr Jeri Cos YW Contrast  Result Date: 01/07/2018 CLINICAL DATA:  Cerebral hemorrhage suspected EXAM: MRI HEAD WITHOUT CONTRAST MRA HEAD WITHOUT CONTRAST TECHNIQUE: Multiplanar, multiecho pulse sequences of the brain and surrounding structures were obtained without intravenous contrast. Angiographic images of the head were obtained using MRA technique without contrast. COMPARISON:  Head CT from yesterday FINDINGS: MRI HEAD FINDINGS Brain: Multiple small acute infarcts that are rounded and linear, seen about the lateral ventricles, juxta cortical white matter, and deep gray nuclei-especially the thalami. These are consistent with acute infarcts, possibly embolic. No hemorrhage. Ventriculomegaly with asymmetric lateral ventricular size, larger on the left, likely developmental and stable since at least 2014. FLAIR hyperintensity around the lateral ventricles is also stable from 2014 and attributed to chronic small vessel ischemia. Known acute infarct in the posterior right thalamus, likely hemorrhagic conversion. No  masslike enhancement or abnormal regional vessels. No detected progression since prior. Vascular: Normal flow voids and vascular enhancements. 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 Atheromatous irregularity of the left V4 and mid basilar. 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 No flow limiting stenosis or branch occlusions seen in the anterior circulation. Negative for beading or aneurysm. 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 There are large bilateral posterior communicating arteries. IMPRESSION: 1. Scattered small acute infarcts in the bilateral cerebral white matter and thalami primarily. The small right thalamic hematoma is likely hemorrhagic conversion. 2. No emergent finding on intracranial MRA. 3. Atheromatous irregularity of the left V4 segment and basilar with high-grade mid basilar narrowing 4. Chronic ventriculomegaly and asymmetric left lateral ventricle dilatation. Chronic small vessel ischemia. Electronically Signed   By: Monte Fantasia M.D.   On: 01/07/2018 13:27   US Abdomen Complete  Result Date: 01/07/2018 CLINICAL DATA:  Abdominal pain EXAM: ABDOMEN ULTRASOUND COMPLETE COMPARISON:  Abdominal and pelvic CT scan of August 26 2017 FINDINGS: Gallbladder: The gallbladder is adequately distended. It contains echogenic sludge. There is no gallbladder wall thickening or positive sonographic Murphy's sign. There is a small amount of pericholecystic fluid but no gallbladder wall thickening. Common bile duct: Diameter: 5 mm Liver: The hepatic echotexture is normal. There is a cyst in the right lobe measuring 1.7 x 1.6 x 1.3 cm which appears stable. There is no intrahepatic ductal dilation. The surface contour of the liver is normal. Portal  vein is patent on color Doppler imaging with normal direction of blood flow towards the liver. IVC: No abnormality visualized. Pancreas: Visualization of the pancreatic head and tail is limited. The pancreatic body is normal. Spleen: Size and appearance within normal limits. Right Kidney:  Length: 10.6 cm. There is a marked small amount of perinephric fluid. The renal cortical echotexture is similar to that of the adjacent liver. There is mild hydronephrosis. Left Kidney: Length: 9.4 cm. There is a small amount of perinephric fluid on the left. The renal cortical echotexture is increased similar to that on the right. There is no definite hydronephrosis. Abdominal aorta: No aneurysm visualized. Other findings: There is ascites. There are bilateral pleural effusions. IMPRESSION: Ascites including pericholecystic fluid and perinephric fluid. Bilateral pleural effusions. Mildly distended gallbladder containing sludge. No sonographic evidence of acute cholecystitis. Normal hepatic echotexture. Stable simple appearing right lobe cyst measuring 1.7 cm in greatest dimension. Increased renal cortical echotexture bilaterally consistent with medical renal disease. Mild right-sided hydronephrosis. Electronically Signed   By: David  Martinique M.D.   On: 01/07/2018 12:02   [place] Ekg Site Rite  Result Date: 01/07/2018 If Site Rite image not attached, placement could not be confirmed due to current cardiac rhythm.   Physical Exam:    Will middle-aged African-American male currently not in distress. . Afebrile. Head is nontraumatic. Neck is supple without bruit.    Cardiac exam no murmur or gallop. Lungs are clear to auscultation. Distal pulses are well felt. Neurological Exam :  Awake alert oriented to person only. Nonfluent speech with word hesitancy and can speak only short sentences and occasional words.Diminished naming and repetition. Seems to comprehend better. Extraocular moments are full range without nystagmus. Blinks  to threat bilaterally.right lower facial weakness. Tongue midline. Motor system exam shows mild right hemiparesis 4/5 strength. Right upper and lower extremity drift. Weakness of right grip and intrinsic hand muscles. Most left side purposefully against gravity. Sensation appears preserved bilaterally. Deep tendon reflexes are diminished on the right normal on the left. Right plantar upgoing left downgoing. Gait not tested.  ASSESSMENT Mr. [name] is a 67 y.o. 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 Hospital day # 11  TREATMENT/PLAN Recommend transesophageal echocardiogram for vegetations her intracardiac clots. Mobilize out of bed. Physical occupational and speech therapy consults..Long discussion the bedside with Dr. Lynetta Mare pulmonary critical care medicine and answered questions. Discussed with multiple family members at the bedside and answered questions.This patient is critically ill and at significant risk of neurological worsening, death and care requires constant monitoring of Wheeler signs, hemodynamics,respiratory and cardiac monitoring, extensive review of multiple databases, frequent neurological assessment, discussion with family, other specialists and medical decision making of high complexity.I have made any additions or clarifications directly to the above note.This critical care time does not reflect procedure time, or teaching time or supervisory time of PA/NP/Med Resident etc but could involve care discussion time.  I spent 30 minutes of neurocritical care time  in the care of  this  patient.      Antony Contras, MD [facility] Pager: [phone] 01/08/2018 2:30 PM

## 2018-01-08 NOTE — Progress Notes (Signed)
At bedside to place PICC , after explaining procedure daughter requested that we come back at a later time.

## 2018-01-08 NOTE — Evaluation (Signed)
Physical Therapy Re-Evaluation Patient Details Name: [name] MRN: [id number] DOB: [birth year] Today's Date: 01/08/2018   History of Present Illness  Pt adm with SOB and found to have PNA. PMH - prostate CA, copd, cad, htn, pt with decreased responsiveness and elevated BP.  MRI with Scattered small acute infarcts in the bilateral cerebral white matter and thalami primarily. The small right thalamic hematoma is likely hemorrhagic conversion.  Clinical Impression  Patient presents with decreased mobility compared to initial evaluation.  He demonstrates general weakness, decreased cognition, decreased balance, decreased safety and deficit awareness and will benefit from continued skilled PT to progress mobility.  Was previously independent and living alone, currently mod to max A for mobility and in room ambulation.     Follow Up Recommendations CIR    Equipment Recommendations  Rolling walker with 5" wheels    Recommendations for Other Services Rehab consult     Precautions / Restrictions Precautions Precautions: Fall      Mobility  Bed Mobility Overal bed mobility: Needs Assistance Bed Mobility: Supine to Sit     Supine to sit: HOB elevated;Mod assist     General bed mobility comments: able to bring legs off bed, but assist to lift trunk  Transfers Overall transfer level: Needs assistance Equipment used: Rolling walker (2 wheeled) Transfers: Sit to/from Stand Sit to Stand: Mod assist;Max assist         General transfer comment: lifting and lowering help  Ambulation/Gait Ambulation/Gait assistance: Mod assist;+2 safety/equipment(assist for IV) Ambulation Distance (Feet): 15 Feet Assistive device: Rolling walker (2 wheeled) Gait Pattern/deviations: Step-through pattern;Step-to pattern;Decreased stride length;Drifts right/left;Narrow base of support     General Gait Details: unsteady with walker and decreased balance during gait, SpO2 91% on RA, HR 88  Stairs             Wheelchair Mobility    Modified Rankin (Stroke Patients Only)       Balance Overall balance assessment: Needs assistance Sitting-balance support: Single extremity supported;Feet supported Sitting balance-Leahy Scale: Fair     Standing balance support: Bilateral upper extremity supported Standing balance-Leahy Scale: Poor Standing balance comment: UE support needed for balance                             Pertinent Vitals/Pain Faces Pain Scale: Hurts little more Pain Location: abdomen Pain Descriptors / Indicators: Sore Pain Intervention(s): Monitored during session;Repositioned    Home Living Family/patient expects to be discharged to:: Private residence Living Arrangements: Alone Available Help at Discharge: Friend(s);Available PRN/intermittently Type of Home: Apartment Home Access: Level entry     Home Layout: One level Home Equipment: None      Prior Function Level of Independence: Independent               Hand Dominance        Extremity/Trunk Assessment   Upper Extremity Assessment Upper Extremity Assessment: RUE deficits/detail;LUE deficits/detail RUE Deficits / Details: not lifting arm fully and tiring out with MMT; grips even with L, but had difficulty placing hand on walker and needed assist LUE Deficits / Details: generalized weakness    Lower Extremity Assessment Lower Extremity Assessment: Generalized weakness    Cervical / Trunk Assessment Cervical / Trunk Assessment: Kyphotic  Communication   Communication: Expressive difficulties(soft voice, weak and low volume)  Cognition Arousal/Alertness: Awake/alert Behavior During Therapy: Flat affect Overall Cognitive Status: Impaired/Different from baseline Area of Impairment: Attention;Safety/judgement  Current Attention Level: Sustained     Safety/Judgement: Decreased awareness of safety;Decreased awareness of deficits     General  Comments: very quiet/weak voice, doesn't believe what "they" say that he is sick      General Comments      Exercises     Assessment/Plan    PT Assessment Patient needs continued PT services  PT Problem List Decreased strength;Decreased activity tolerance;Decreased balance;Decreased knowledge of use of DME;Decreased cognition;Decreased mobility;Decreased safety awareness;Cardiopulmonary status limiting activity       PT Treatment Interventions DME instruction;Gait training;Functional mobility training;Therapeutic activities;Therapeutic exercise;Balance training;Patient/family education;Neuromuscular re-education    PT Goals (Current goals can be found in the Care Plan section)  Acute Rehab PT Goals Patient Stated Goal: family & patient agreeable to rehab PT Goal Formulation: With patient/family Time For Goal Achievement: 01/22/18 Potential to Achieve Goals: Good    Frequency Min 4X/week   Barriers to discharge        Co-evaluation               AM-PAC PT "6 Clicks" Daily Activity  Outcome Measure Difficulty turning over in bed (including adjusting bedclothes, sheets and blankets)?: A Lot Difficulty moving from lying on back to sitting on the side of the bed? : Unable Difficulty sitting down on and standing up from a chair with arms (e.g., wheelchair, bedside commode, etc,.)?: Unable Help needed moving to and from a bed to chair (including a wheelchair)?: A Lot Help needed walking in hospital room?: A Lot Help needed climbing 3-5 steps with a railing? : Total 6 Click Score: 9    End of Session Equipment Utilized During Treatment: Gait belt Activity Tolerance: Patient limited by fatigue Patient left: in chair;with chair alarm set;with call bell/phone within reach;with family/visitor present Nurse Communication: Mobility status PT Visit Diagnosis: Other abnormalities of gait and mobility (R26.89);Muscle weakness (generalized) (M62.81)    Time: 1040-1109 PT Time  Calculation (min) (ACUTE ONLY): 29 min   Charges:   PT Evaluation $PT Re-evaluation: 1 Re-eval PT Treatments $Gait Training: 8-22 mins   PT G CodesMagda Kiel, [place] [phone] 01/08/2018   Reginia Naas 01/08/2018, 1:57 PM

## 2018-01-08 NOTE — Progress Notes (Signed)
eLink Physician-Brief Progress Note Patient Name: Sarkis Rhines DOB: [birth year] MRN: [id number]   Date of Service  01/08/2018  HPI/Events of Note  Request to transfer patient to a stepdown bed to make room for a critically ill patient.   eICU Interventions  Will transfer patient to a stepdown bed.      Intervention Category Minor Interventions: Routine modifications to care plan (e.g. PRN medications for pain, fever)  Orvie Caradine Eugene 01/08/2018, 5:44 AM

## 2018-01-08 NOTE — Evaluation (Signed)
Clinical/Bedside Swallow Evaluation Patient Details  Name: [name] MRN: [id number] Date of Birth: [birth year]  Today's Date: 01/08/2018 Time: SLP Start Time (ACUTE ONLY): 0830 SLP Stop Time (ACUTE ONLY): 0845 SLP Time Calculation (min) (ACUTE ONLY): 15 min  Past Medical History:  Past Medical History:  Diagnosis Date  . BPH with urinary obstruction   . CAD (coronary artery disease)    per cardiac cath 01-08-2003  non-obstructive mild cad w/ normal LVSF  . COPD (chronic obstructive pulmonary disease) ([place])    per pulmologist note in 2009  GOLD 1  . DDD (degenerative disc disease), cervical    C5-7, C7-T1  . Diverticulosis of colon   . Elevated PSA   . GERD (gastroesophageal reflux disease)   . History of adenomatous polyp of colon   . History of hypertension   . History of precordial chest pain    as of 07-22-2017  denies cardiac S&S  . Prostate cancer ([place])   . Wears glasses    Past Surgical History:  Past Surgical History:  Procedure Laterality Date  . CARDIAC CATHETERIZATION  01-08-2003   dr Lyndel Safe   mild non-obstructive CAD,  normal LVSF, ef 55% (positive myoview)  . COLONOSCOPY  last one 12-14-2015  . IR THORACENTESIS ASP PLEURAL SPACE W/IMG GUIDE  01/03/2018  . PROSTATE BIOPSY N/A 07/25/2017   Procedure: BIOPSY TRANSRECTAL ULTRASONIC PROSTATE (TUBP);  Surgeon: Alexis Frock, MD;  Location: [facility];  Service: Urology;  Laterality: N/A;   HPI:  67 year old male with recent eval for pulmonary infection initially concerning for TB, now being treated for NTM. Admitted 4/20 for lobar PNA. ABX directed by ID. Pleural effusion was exudate, not lymph predominant. Course now complicated by new onset CHF with EF 25-30%. Also 4/29 he developed some lethargy and was found to have small ICH in right thalmus/    Assessment / Plan / Recommendation Clinical Impression  Pt demonstrates no immediate signs of aspiration or neuromuscular impairment following small  CVA. However, given signs of thrush, hoarse vocal quality, weak cough, recurrent infectious process in right lung and history of esophageal dysphagia pt may benefit from objective swallow testing while admitted given potential for a oropharyngeal/esophageal dysphagia impacting pulmonary health. Will initiate a dys 2 (finely chopped) diet and thin liquids today and f/u for tolerance tomorrow and discussion with pt and family regarding possible MBS. Also reviewed aspiration precautions and strategies to facilitate esophageal transit of bolus with pts family at bedside.  SLP Visit Diagnosis: Dysphagia, unspecified (R13.10)    Aspiration Risk  Mild aspiration risk    Diet Recommendation Dysphagia 2 (Fine chop);Thin liquid   Liquid Administration via: Cup;Straw Medication Administration: Whole meds with liquid Supervision: Patient able to self feed Compensations: Slow rate;Small sips/bites Postural Changes: Seated upright at 90 degrees    Other  Recommendations Oral Care Recommendations: Oral care BID   Follow up Recommendations 24 hour supervision/assistance      Frequency and Duration min 2x/week  1 week       Prognosis Prognosis for Safe Diet Advancement: Fair      Swallow Study   General HPI: 67 year old male with recent eval for pulmonary infection initially concerning for TB, now being treated for NTM. Admitted 4/20 for lobar PNA. ABX directed by ID. Pleural effusion was exudate, not lymph predominant. Course now complicated by new onset CHF with EF 25-30%. Also 4/29 he developed some lethargy and was found to have small ICH in right thalmus/  Type of Study: Bedside Swallow Evaluation Previous Swallow Assessment: none Diet Prior to this Study: NPO Temperature Spikes Noted: No Respiratory Status: Room air History of Recent Intubation: No Behavior/Cognition: Alert;Cooperative Oral Cavity Assessment: Other (comment)(white coating) Oral Care Completed by SLP: No Oral Cavity -  Dentition: Edentulous Vision: Functional for self-feeding Self-Feeding Abilities: Able to feed self Patient Positioning: Upright in bed Baseline Vocal Quality: Low vocal intensity;Hoarse;Breathy Volitional Cough: Other (Comment)(refused due to side pain, cleared throat) Volitional Swallow: Able to elicit    Oral/Motor/Sensory Function Overall Oral Motor/Sensory Function: Within functional limits   Ice Chips     Thin Liquid Thin Liquid: Impaired Presentation: Straw Pharyngeal  Phase Impairments: Suspected delayed Swallow    Nectar Thick Nectar Thick Liquid: Not tested   Honey Thick Honey Thick Liquid: Not tested   Puree Puree: Impaired Presentation: Spoon Pharyngeal Phase Impairments: Suspected delayed Swallow   Solid   GO   Solid: Impaired Oral Phase Impairments: Impaired mastication Oral Phase Functional Implications: Impaired mastication;Prolonged oral transit       Herbie Baltimore, MA CCC-SLP [phone]  Merla Sawka, Katherene Ponto 01/08/2018,8:59 AM

## 2018-01-08 NOTE — Progress Notes (Signed)
Progress Note  Patient Name: [name] Date of Encounter: 01/08/2018  Primary Cardiologist:   No primary care provider on file.   Subjective   Still speaks in a whisper.  Denies chest pain or SOB but says his stomach hurts.  Nurse reports that he has been restless.    Inpatient Medications    Scheduled Meds: . benzonatate  200 mg Oral TID  . carvedilol  6.25 mg Oral BID WC  . chlorpheniramine-HYDROcodone  5 mL Oral Q12H  . feeding supplement (ENSURE ENLIVE)  237 mL Oral TID BM  . furosemide  40 mg Intravenous Daily  . guaiFENesin  600 mg Oral BID  . ipratropium-albuterol  3 mL Nebulization BID  . losartan  25 mg Oral Daily  . mouth rinse  15 mL Mouth Rinse BID  . polyethylene glycol  17 g Oral Daily  . sodium chloride flush  3 mL Intravenous Q12H   Continuous Infusions: . azithromycin Stopped (01/07/18 1430)  . cefOXitin (MEFOXIN) continuous infusion 12 g (01/08/18 0851)  . linezolid (ZYVOX) IV 600 mg (01/08/18 1206)   PRN Meds: acetaminophen **OR** acetaminophen, alum & mag hydroxide-simeth, benzonatate, hydrALAZINE, ipratropium-albuterol, lidocaine (PF), metoprolol tartrate, ondansetron **OR** ondansetron (ZOFRAN) IV   Vital Signs    Vitals:   01/08/18 0630 01/08/18 0653 01/08/18 0846 01/08/18 0917  BP: (!) 150/97 (!) 147/97    Pulse: 92 83    Resp: (!) 26 (!) 21    Temp:   (!) 97.3 F (36.3 C)   TempSrc:   Axillary   SpO2: 97% 94%  99%  Weight:      Height:        Intake/Output Summary (Last 24 hours) at 01/08/2018 1219 Last data filed at 01/08/2018 1153 Gross per 24 hour  Intake 1206.16 ml  Output 1675 ml  Net -468.84 ml   Filed Weights   01/03/18 0502 01/05/18 0557 01/06/18 0555  Weight: 110 lb 14.4 oz (50.3 kg) 121 lb 0.5 oz (54.9 kg) 111 lb 9.6 oz (50.6 kg)    Telemetry    NSR  - Personally Reviewed  ECG    NA - Personally Reviewed  Physical Exam   GEN: No  acute distress.   Neck: No  JVD Cardiac: RRR, no murmurs, rubs, or gallops.    Respiratory: Clear   to auscultation bilaterally. GI: Soft, nontender, non-distended, normal bowel sounds  MS:  No edema; No deformity. Neuro:   Nonfocal    Labs    Chemistry Recent Labs  Lab 01/05/18 0607 01/06/18 0459 01/07/18 0514  NA 132* 132* 132*  K 4.9 4.7 4.4  CL 95* 100* 95*  CO2 26 25 26   GLUCOSE 130* 104* 88  BUN 33* 42* 43*  CREATININE 1.80* 1.17 1.21  CALCIUM 8.4* 8.2* 8.5*  PROT  --   --  6.2*  ALBUMIN  --   --  1.4*  AST  --   --  79*  ALT  --   --  107*  ALKPHOS  --   --  120  BILITOT  --   --  0.7  GFRNONAA 37* >60 >60  GFRAA 43* >60 >60  ANIONGAP 11 7 11      Hematology Recent Labs  Lab 01/05/18 0607 01/06/18 0459 01/07/18 0514  WBC 11.5* 22.5* 25.4*  RBC 4.81 4.28 4.41  HGB 12.3* 10.8* 11.1*  HCT 35.5* 31.3* 32.5*  MCV 73.8* 73.1* 73.7*  MCH 25.6* 25.2* 25.2*  MCHC 34.6 34.5 34.2  RDW  13.0 13.2 13.1  PLT 591* 643* 627*    Cardiac EnzymesNo results for input(s): TROPONINI in the last 168 hours. No results for input(s): TROPIPOC in the last 168 hours.   BNPNo results for input(s): BNP, PROBNP in the last 168 hours.   DDimer No results for input(s): DDIMER in the last 168 hours.   Radiology    Ct Head Wo Contrast  Result Date: 01/07/2018 CLINICAL DATA:  Initial evaluation for acute altered mental status. EXAM: CT HEAD WITHOUT CONTRAST TECHNIQUE: Contiguous axial images were obtained from the base of the skull through the vertex without intravenous contrast. COMPARISON:  Prior CT from 11/29/2017 FINDINGS: Brain: Cerebral volume stable, and remains within normal limits. There is an acute intraparenchymal hemorrhage measuring approximately 1 cm in size positioned at the right thalamus (series 3, image 14). Minimal surrounding edema without significant mass effect. No intraventricular extension or other complication. No other acute intracranial hemorrhage. No acute large vessel territory infarct. No mass lesion or midline shift.  Ventriculomegaly with asymmetric dilatation of the left lateral ventricle, stable. No extra-axial fluid collection. Vascular: No hyperdense vessel. Calcified atherosclerosis at the skull base. Skull: Scalp soft tissues and calvarium within normal limits. Sinuses/Orbits: Globes and orbital soft tissues within normal limits. Moderate opacification of the right ethmoidal air cells. Paranasal sinuses are otherwise clear. Mastoids are largely clear. Other: None. IMPRESSION: 1. Approximate 1 cm acute intraparenchymal hemorrhage centered at the right thalamus without significant edema. 2. Otherwise stable appearance of the brain. Attempt is being made to contact the ordering clinician. Results will be communicated as soon as possible. Electronically Signed   By: Jeannine Boga M.D.   On: 01/07/2018 00:16   Mr Jodene Nam Head Wo Contrast  Result Date: 01/07/2018 CLINICAL DATA:  Cerebral hemorrhage suspected EXAM: MRI HEAD WITHOUT CONTRAST MRA HEAD WITHOUT CONTRAST TECHNIQUE: Multiplanar, multiecho pulse sequences of the brain and surrounding structures were obtained without intravenous contrast. Angiographic images of the head were obtained using MRA technique without contrast. COMPARISON:  Head CT from yesterday FINDINGS: MRI HEAD FINDINGS Brain: Multiple small acute infarcts that are rounded and linear, seen about the lateral ventricles, juxta cortical white matter, and deep gray nuclei-especially the thalami. These are consistent with acute infarcts, possibly embolic. No hemorrhage. Ventriculomegaly with asymmetric lateral ventricular size, larger on the left, likely developmental and stable since at least 2014. FLAIR hyperintensity around the lateral ventricles is also stable from 2014 and attributed to chronic small vessel ischemia. Known acute infarct in the posterior right thalamus, likely hemorrhagic conversion. No masslike enhancement or abnormal regional vessels. No detected progression since prior. Vascular:  Normal flow voids and vascular enhancements. 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 Atheromatous irregularity of the left V4 and mid basilar. 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 No flow limiting stenosis or branch occlusions seen in the anterior circulation. Negative for beading or aneurysm. 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 There are large bilateral posterior communicating arteries. IMPRESSION: 1. Scattered small acute infarcts in the bilateral cerebral white matter and thalami primarily. The small right thalamic hematoma is likely hemorrhagic conversion. 2. No emergent finding on intracranial MRA. 3. Atheromatous irregularity of the left V4 segment and basilar with high-grade mid basilar narrowing 4. Chronic ventriculomegaly and asymmetric left lateral ventricle dilatation. Chronic small vessel ischemia.  Electronically Signed   By: Monte Fantasia M.D.   On: 01/07/2018 13:27   Mr Jeri Cos ZO Contrast  Result Date: 01/07/2018 CLINICAL DATA:  Cerebral hemorrhage suspected EXAM: MRI HEAD WITHOUT CONTRAST MRA HEAD WITHOUT CONTRAST TECHNIQUE: Multiplanar, multiecho pulse sequences of the brain and surrounding structures were obtained without intravenous contrast. Angiographic images of the head were obtained using MRA technique without contrast. COMPARISON:  Head CT from yesterday FINDINGS: MRI HEAD FINDINGS Brain: Multiple small acute infarcts that are rounded and linear, seen about the lateral ventricles, juxta cortical white matter, and deep gray nuclei-especially the thalami. These are consistent with acute infarcts, possibly embolic. No hemorrhage. Ventriculomegaly with asymmetric lateral ventricular size, larger on the left, likely  developmental and stable since at least 2014. FLAIR hyperintensity around the lateral ventricles is also stable from 2014 and attributed to chronic small vessel ischemia. Known acute infarct in the posterior right thalamus, likely hemorrhagic conversion. No masslike enhancement or abnormal regional vessels. No detected progression since prior. Vascular: Normal flow voids and vascular enhancements. 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 Atheromatous irregularity of the left V4 and mid basilar. 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 No flow limiting stenosis or branch occlusions seen in the anterior circulation. Negative for beading or aneurysm. 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 There are large bilateral posterior communicating arteries. IMPRESSION: 1. Scattered small acute infarcts in the bilateral cerebral white matter and thalami primarily. The small right thalamic hematoma is likely hemorrhagic conversion. 2. No emergent finding on intracranial MRA. 3. Atheromatous irregularity of the left V4 segment and basilar with high-grade mid basilar narrowing 4. Chronic ventriculomegaly and asymmetric left lateral ventricle dilatation. Chronic small vessel ischemia. Electronically Signed   By: Monte Fantasia M.D.   On: 01/07/2018 13:27   US Abdomen Complete  Result Date: 01/07/2018 CLINICAL DATA:  Abdominal pain EXAM: ABDOMEN ULTRASOUND COMPLETE COMPARISON:  Abdominal and pelvic CT scan of August 26 2017 FINDINGS: Gallbladder: The gallbladder is adequately distended. It contains echogenic sludge. There is no gallbladder wall thickening or positive sonographic Murphy's sign. There is a small amount of pericholecystic fluid but no gallbladder wall  thickening. Common bile duct: Diameter: 5 mm Liver: The hepatic echotexture is normal. There is a cyst in the right lobe measuring 1.7 x 1.6 x 1.3 cm which appears stable. There is no intrahepatic ductal dilation. The surface contour of the liver is normal. Portal vein is patent on color Doppler imaging with normal direction of blood flow towards the liver. IVC: No abnormality visualized. Pancreas: Visualization of the pancreatic head and tail is limited. The pancreatic body is normal. Spleen: Size and appearance within normal limits. Right Kidney: Length: 10.6 cm. There is a marked small amount of perinephric fluid. The renal cortical echotexture is similar to that of the adjacent liver. There is mild hydronephrosis. Left Kidney: Length: 9.4 cm. There is a small amount of perinephric fluid on the left. The renal cortical echotexture is increased similar to that on the right. There is no definite hydronephrosis. Abdominal aorta: No aneurysm visualized. Other findings: There is ascites. There are bilateral pleural effusions. IMPRESSION: Ascites including pericholecystic fluid and perinephric fluid. Bilateral pleural effusions. Mildly distended gallbladder containing sludge. No sonographic evidence of acute cholecystitis. Normal hepatic echotexture. Stable simple appearing right lobe  cyst measuring 1.7 cm in greatest dimension. Increased renal cortical echotexture bilaterally consistent with medical renal disease. Mild right-sided hydronephrosis. Electronically Signed   By: David  Martinique M.D.   On: 01/07/2018 12:02   [place] Ekg Site Rite  Result Date: 01/07/2018 If Site Rite image not attached, placement could not be confirmed due to current cardiac rhythm.   Cardiac Studies   ECHO:  01/07/18  Study Conclusions  - Left ventricle: The cavity size was normal. Systolic function was   severely reduced. The estimated ejection fraction was in the   range of 25% to 30%. Diffuse hypokinesis. Doppler parameters  are   consistent with abnormal left ventricular relaxation (grade 1   diastolic dysfunction). Doppler parameters are consistent with   elevated ventricular end-diastolic filling pressure. - Mitral valve: There was moderate regurgitation directed centrally   and posteriorly. - Right ventricle: The cavity size was normal. Wall thickness was   normal. Systolic function was normal. - Tricuspid valve: There was trivial regurgitation. - Pulmonary arteries: Systolic pressure was within the normal   range. - Inferior vena cava: The vessel was not visualized. - Pericardium, extracardiac: There was no pericardial effusion.   Patient Profile     67 y.o. male with a hx of prostate cancer, hypertension, GERD, COPD, CAD with mild nonobstructive disease by cath 2004 who is being seen for the evaluation of CHF and new diagnosis of cardiomyopathy at the request of Dr. Tawanna Solo.   Assessment & Plan    CVA:    ICH right thalamus.  Plans per neuro.    HTN:  Goal is SBP less than 140.   Resumed POs.  I will increase the Coreg and Cozaar has his BP is elevated.    CARDIOMYOPATHY:  New diagnosis without clear etiology but I suspect non ischemic.  Resumed Coreg and Cozaar.  Dose increased as above.  Continue IV Lasix for today.     For questions or updates, please contact [place] Please consult [email or web address] for contact info under Cardiology/STEMI.   Signed, Minus Breeding, MD  01/08/2018, 12:19 PM

## 2018-01-08 NOTE — Plan of Care (Signed)
Problem: Education: Goal: Knowledge of General Education information will improve 01/08/2018 1811 by Moise Boring, RN Outcome: Progressing 01/08/2018 1751 by Moise Boring, RN Outcome: Progressing   Problem: Health Behavior/Discharge Planning: Goal: Ability to manage health-related needs will improve 01/08/2018 1811 by Moise Boring, RN Outcome: Progressing 01/08/2018 1751 by Moise Boring, RN Outcome: Progressing   Problem: Clinical Measurements: Goal: Ability to maintain clinical measurements within normal limits will improve 01/08/2018 1811 by Moise Boring, RN Outcome: Progressing 01/08/2018 1751 by Moise Boring, RN Outcome: Progressing Goal: Will remain free from infection 01/08/2018 1811 by Moise Boring, RN Outcome: Progressing 01/08/2018 1751 by Moise Boring, RN Outcome: Progressing Goal: Diagnostic test results will improve 01/08/2018 1811 by Moise Boring, RN Outcome: Progressing 01/08/2018 1751 by Moise Boring, RN Outcome: Progressing Goal: Respiratory complications will improve 01/08/2018 1811 by Moise Boring, RN Outcome: Progressing 01/08/2018 1751 by Moise Boring, RN Outcome: Progressing Goal: Cardiovascular complication will be avoided 01/08/2018 1811 by Moise Boring, RN Outcome: Progressing 01/08/2018 1751 by Moise Boring, RN Outcome: Progressing   Problem: Clinical Measurements: Goal: Will remain free from infection 01/08/2018 1811 by Moise Boring, RN Outcome: Progressing 01/08/2018 1751 by Moise Boring, RN Outcome: Progressing   Problem: Clinical Measurements: Goal: Diagnostic test results will improve 01/08/2018 1811 by Moise Boring, RN Outcome: Progressing 01/08/2018 1751 by Moise Boring, RN Outcome: Progressing   Problem: Clinical Measurements: Goal: Respiratory complications will improve 01/08/2018 1811 by Moise Boring, RN Outcome: Progressing 01/08/2018 1751 by  Moise Boring, RN Outcome: Progressing   Problem: Clinical Measurements: Goal: Cardiovascular complication will be avoided 01/08/2018 1811 by Moise Boring, RN Outcome: Progressing 01/08/2018 1751 by Moise Boring, RN Outcome: Progressing   Problem: Nutrition: Goal: Adequate nutrition will be maintained 01/08/2018 1811 by Moise Boring, RN Outcome: Progressing 01/08/2018 1751 by Moise Boring, RN Outcome: Progressing   Problem: Skin Integrity: Goal: Risk for impaired skin integrity will decrease 01/08/2018 1811 by Moise Boring, RN Outcome: Progressing 01/08/2018 1751 by Moise Boring, RN Outcome: Progressing   Problem: Education: Goal: Knowledge of disease or condition will improve 01/08/2018 1811 by Moise Boring, RN Outcome: Progressing 01/08/2018 1751 by Moise Boring, RN Outcome: Progressing Goal: Knowledge of secondary prevention will improve 01/08/2018 1811 by Moise Boring, RN Outcome: Progressing 01/08/2018 1751 by Moise Boring, RN Outcome: Progressing Goal: Knowledge of patient specific risk factors addressed and post discharge goals established will improve 01/08/2018 1811 by Moise Boring, RN Outcome: Progressing 01/08/2018 1751 by Moise Boring, RN Outcome: Progressing   Problem: Education: Goal: Knowledge of secondary prevention will improve 01/08/2018 1811 by Moise Boring, RN Outcome: Progressing 01/08/2018 1751 by Moise Boring, RN Outcome: Progressing   Problem: Education: Goal: Knowledge of patient specific risk factors addressed and post discharge goals established will improve 01/08/2018 1811 by Moise Boring, RN Outcome: Progressing 01/08/2018 1751 by Moise Boring, RN Outcome: Progressing   Problem: Coping: Goal: Will verbalize positive feelings about self 01/08/2018 1811 by Moise Boring, RN Outcome: Progressing 01/08/2018 1751 by Moise Boring, RN Outcome:  Progressing Goal: Will identify appropriate support needs 01/08/2018 1811 by Moise Boring, RN Outcome: Progressing 01/08/2018 1751 by Moise Boring, RN Outcome: Progressing   Problem: Health Behavior/Discharge Planning: Goal: Ability to manage health-related needs will improve 01/08/2018 1811 by Moise Boring, RN Outcome: Progressing 01/08/2018 1751 by Delena Serve  M, RN Outcome: Progressing   Problem: Self-Care: Goal: Ability to participate in self-care as condition permits will improve 01/08/2018 1811 by Moise Boring, RN Outcome: Progressing 01/08/2018 1751 by Moise Boring, RN Outcome: Progressing Goal: Verbalization of feelings and concerns over difficulty with self-care will improve 01/08/2018 1811 by Moise Boring, RN Outcome: Progressing 01/08/2018 1751 by Moise Boring, RN Outcome: Progressing Goal: Ability to communicate needs accurately will improve 01/08/2018 1811 by Moise Boring, RN Outcome: Progressing 01/08/2018 1751 by Moise Boring, RN Outcome: Progressing   Problem: Nutrition: Goal: Risk of aspiration will decrease 01/08/2018 1811 by Moise Boring, RN Outcome: Progressing 01/08/2018 1751 by Moise Boring, RN Outcome: Progressing Goal: Dietary intake will improve 01/08/2018 1811 by Moise Boring, RN Outcome: Progressing 01/08/2018 1751 by Moise Boring, RN Outcome: Progressing   Problem: Intracerebral Hemorrhage Tissue Perfusion: Goal: Complications of Intracerebral Hemorrhage will be minimized 01/08/2018 1811 by Moise Boring, RN Outcome: Progressing 01/08/2018 1751 by Moise Boring, RN Outcome: Progressing   Problem: Ischemic Stroke/TIA Tissue Perfusion: Goal: Complications of ischemic stroke/TIA will be minimized 01/08/2018 1811 by Moise Boring, RN Outcome: Progressing 01/08/2018 1751 by Moise Boring, RN Outcome: Progressing

## 2018-01-08 NOTE — Progress Notes (Signed)
PULMONARY / CRITICAL CARE MEDICINE   Name: [name] MRN: [id number] DOB: Jun 18, 1951    ADMISSION DATE:  12/28/2017 CONSULTATION DATE: January 07, 2018   REFERRING MD:  Dr Jerrell Belfast  CHIEF COMPLAINT: Intraparenchymal hemorrhage  HISTORY OF PRESENT ILLNESS:   67 year old male with past medical history as below, which is significant for COPD, nonobstructive coronary artery disease, prostate cancer (Deferred therapy, PET in 08/2107 with no evidence of mets), and hypertension. was recently hospitalized for recent productive cough, shortness of breath, wheezing and chest pain.  A CT showed some new peripheral lung nodules. One of 3 AFB specimens was positive at that time. ID was consulted and recommended DC antibiotics. He had nodule biopsy done 4/11. No organisms identified. Cytology was pending. He was then admitted again to [facility] on 4/20 for worsening cough. He had another culture come back AFB positive, but mTB PCR was negative.  ID was again consulted and have been directing ABX.  Also developed small pleural effusion which was sampled. Found to be an exudate without lymph predominance making it unlikely to be related to NTM infection. He also had an echo done 4/28 which demonstrated severe systolic CHF, which was a new finding at the time (LVEF 25-30%). Around that same time he became lethargic, which persisted for several hours. He underwent contrast CT of the head, which identified a 10 mm acute IPH centered at the right thalamus without significant edema. He was seen by neurology who recommended transfer to ICU for tight BP control. PCCM consulted for medical care in ICU.   SUBJECTIVE:  Now in SDU.  Did not require gtt for HTN.  Did get PRN hydralazine this am for SBP 160's.   Sitting OOB in chair.  No c/o.   VITAL SIGNS: BP (!) 147/97   Pulse 83   Temp (!) 97.3 F (36.3 C) (Axillary)   Resp (!) 21   Ht 5\' 4"  (1.626 m)   Wt 50.6 kg (111 lb 9.6 oz)   SpO2 99%   BMI 19.16 kg/m     INTAKE / OUTPUT: I/O last 3 completed shifts: In: 1361.5 [I.V.:16.5; WERXV:400; IV Piggyback:700] Out: 4650 [Urine:4650]  PHYSICAL EXAMINATION: General:  Frail male who appears older than stated age, NAD in chair.  Neuro:  Awake, alert, slow to respond at times, flat affect, follows commands   HEENT:  [place]/AT, PERRL, no JVD Cardiovascular:  RRR, no MRG Lungs: resps even non labored on RA Abdomen:   Non-distended. Soft.  Musculoskeletal:  Frail, no edema  Skin: Grossly intact  LABS:  BMET Recent Labs  Lab 01/05/18 0607 01/06/18 0459 01/07/18 0514  NA 132* 132* 132*  K 4.9 4.7 4.4  CL 95* 100* 95*  CO2 26 25 26   BUN 33* 42* 43*  CREATININE 1.80* 1.17 1.21  GLUCOSE 130* 104* 88    Electrolytes Recent Labs  Lab 01/05/18 0607 01/06/18 0459 01/07/18 0514  CALCIUM 8.4* 8.2* 8.5*  MG 2.0  --   --     CBC Recent Labs  Lab 01/05/18 0607 01/06/18 0459 01/07/18 0514  WBC 11.5* 22.5* 25.4*  HGB 12.3* 10.8* 11.1*  HCT 35.5* 31.3* 32.5*  PLT 591* 643* 627*    Coag's No results for input(s): APTT, INR in the last 168 hours.  Sepsis Markers No results for input(s): LATICACIDVEN, PROCALCITON, O2SATVEN in the last 168 hours.  ABG Recent Labs  Lab 01/06/18 1530  PHART 7.471*  PCO2ART 33.8  PO2ART 84.4    Liver Enzymes Recent  Labs  Lab 01/07/18 0514  AST 79*  ALT 107*  ALKPHOS 120  BILITOT 0.7  ALBUMIN 1.4*    Cardiac Enzymes No results for input(s): TROPONINI, PROBNP in the last 168 hours.  Glucose No results for input(s): GLUCAP in the last 168 hours.  Imaging Mr Virgel Paling BJ Contrast  Result Date: 01/07/2018 CLINICAL DATA:  Cerebral hemorrhage suspected EXAM: MRI HEAD WITHOUT CONTRAST MRA HEAD WITHOUT CONTRAST TECHNIQUE: Multiplanar, multiecho pulse sequences of the brain and surrounding structures were obtained without intravenous contrast. Angiographic images of the head were obtained using MRA technique without contrast. COMPARISON:  Head  CT from yesterday FINDINGS: MRI HEAD FINDINGS Brain: Multiple small acute infarcts that are rounded and linear, seen about the lateral ventricles, juxta cortical white matter, and deep gray nuclei-especially the thalami. These are consistent with acute infarcts, possibly embolic. No hemorrhage. Ventriculomegaly with asymmetric lateral ventricular size, larger on the left, likely developmental and stable since at least 2014. FLAIR hyperintensity around the lateral ventricles is also stable from 2014 and attributed to chronic small vessel ischemia. Known acute infarct in the posterior right thalamus, likely hemorrhagic conversion. No masslike enhancement or abnormal regional vessels. No detected progression since prior. Vascular: Normal flow voids and vascular enhancements. 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 Atheromatous irregularity of the left V4 and mid basilar. 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 No flow limiting stenosis or branch occlusions seen in the anterior circulation. Negative for beading or aneurysm. 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 There are large bilateral posterior communicating arteries. IMPRESSION: 1. Scattered small acute infarcts in the bilateral cerebral white matter and thalami primarily. The small right thalamic hematoma is likely hemorrhagic conversion. 2. No emergent finding on intracranial MRA. 3. Atheromatous irregularity of the left V4 segment and basilar with high-grade mid basilar narrowing 4. Chronic ventriculomegaly and asymmetric left lateral ventricle dilatation. Chronic small vessel ischemia. Electronically Signed   By: Monte Fantasia M.D.   On: 01/07/2018 13:27   Mr Jeri Cos YN Contrast  Result Date:  01/07/2018 CLINICAL DATA:  Cerebral hemorrhage suspected EXAM: MRI HEAD WITHOUT CONTRAST MRA HEAD WITHOUT CONTRAST TECHNIQUE: Multiplanar, multiecho pulse sequences of the brain and surrounding structures were obtained without intravenous contrast. Angiographic images of the head were obtained using MRA technique without contrast. COMPARISON:  Head CT from yesterday FINDINGS: MRI HEAD FINDINGS Brain: Multiple small acute infarcts that are rounded and linear, seen about the lateral ventricles, juxta cortical white matter, and deep gray nuclei-especially the thalami. These are consistent with acute infarcts, possibly embolic. No hemorrhage. Ventriculomegaly with asymmetric lateral ventricular size, larger on the left, likely developmental and stable since at least 2014. FLAIR hyperintensity around the lateral ventricles is also stable from 2014 and attributed to chronic small vessel ischemia. Known acute infarct in the posterior right thalamus, likely hemorrhagic conversion. No masslike enhancement or abnormal regional vessels. No detected progression since prior. Vascular: Normal flow voids and vascular enhancements. 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 Atheromatous irregularity of the left V4 and mid basilar. 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 No flow limiting stenosis or branch occlusions seen in the anterior circulation. Negative  for beading or aneurysm. 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 There are large bilateral posterior communicating arteries. IMPRESSION: 1. Scattered small acute infarcts in the bilateral cerebral white matter and thalami primarily. The small right thalamic hematoma is likely hemorrhagic conversion. 2. No emergent finding on  intracranial MRA. 3. Atheromatous irregularity of the left V4 segment and basilar with high-grade mid basilar narrowing 4. Chronic ventriculomegaly and asymmetric left lateral ventricle dilatation. Chronic small vessel ischemia. Electronically Signed   By: Monte Fantasia M.D.   On: 01/07/2018 13:27   [place] Ekg Site Rite  Result Date: 01/07/2018 If Site Rite image not attached, placement could not be confirmed due to current cardiac rhythm.    STUDIES:  CT head 4/29 > 10 mm acute IPH centered at the right thalamus without significant edema. Echo 4/29 > The cavity size was normal. Systolic function was severely reduced. The estimated ejection fraction was in the range of 25% to 30%. Diffuse hypokinesis. Doppler parameters are consistent with abnormal left ventricular relaxation (grade 1 diastolic dysfunction). Moderate MR.  CULTURES: Blood 4/20: neg Pleural fluid culture 4/26 > Pleural fluid acid fast smear 4/26 > Pleural fluid fungal culture 4/26 >  Blood culture 4/29 >  ANTIBIOTICS: Cefoxitin Tigecycline Azitrhomycin  SIGNIFICANT EVENTS: 4/20 admit 4/26 thoracentesis (exudate, no lymph prominence) 4/29 Card consult, new HF noted on echo 4/29 lethargy. Head CT showed small IPH. Transferred to ICU for antiHTN gtt.   LINES/TUBES:   DISCUSSION: 67 year old male with recent eval for pulmonary infection initially concerning for TB, now being treated for NTM. Admitted 4/20 for lobar PNA. ABX directed by ID. Pleural effusion was exudate, not lymph predominant. Course now complicated by new onset CHF with EF 25-30%. Also 4/29 he developed some lethargy and was found to have small ICH.   ASSESSMENT / PLAN:  Lobar pneumonia  Non-TB mycobacterial pulmonary infection PLAN -  Cont abx per ID  Follow pleural fluid cultures - pending  Pulmonary hygiene  O2 as needed  F/u CXR    Right thalamic IPH 30mm. PLAN -  Per neuro  BP control as below    Acute encephalopathy out of  proportion to [place] - improving.  - Supportive care  Chronic systolic CHF (LVEF 03-54%). Diastolic dysfunction grade 1 as well on echo Hypertension PLAN -  Cardiology following  IV hydralazine, metoprolol PRN  Resume coreg, losartan now that he is able to take POs Goal SBP <140   Asthma/COPD without acute exacerbation PLAN -  Scheduled, PRN nebs No role steroids  Abdominal Pain - US abdomen - neg acute   Transaminitis - follow LFT  Prostate Cancer Dx one year ago. Has deferred treatment. PET in 08/2017 shows no mets.  -Outpatient follow up at the patient's discretion.    DVT: holding chemoprophylaxis. SCDs only Diet: dysphagia diet per SLP  Code: Full Dispo: SDU    FAMILY  - Updates: Daughter updated 5/1  - Inter-disciplinary family meet or Palliative Care meeting due by: 5/6   Will ask TRH to resume care 5/2.   Nickolas Madrid, NP 01/08/2018  11:41 AM Pager: [phone] or [phone]

## 2018-01-08 NOTE — Progress Notes (Signed)
INFECTIOUS DISEASE PROGRESS NOTE  ID: [name] is a 67 y.o. male with  Principal Problem:   Lobar pneumonia ([place]) Active Problems:   G E R D   Lung nodules   Hyponatremia   Mycobacterial disease   Pleural effusion   Altered mental status   Protein-calorie malnutrition, severe   Intracerebral hemorrhage  Subjective: No complaints  Per family having headaches, decreased apetite  Abtx:  Anti-infectives (From admission, onward)   Start     Dose/Rate Route Frequency Ordered Stop   01/08/18 0800  cefOXitin (MEFOXIN) 12 g in sodium chloride 0.9 % 190 mL continuous infusion     12 g 12.5 mL/hr over 20 Hours Intravenous Every 24 hours 01/07/18 1520     01/07/18 1700  cefOXitin (MEFOXIN) 8 g in sodium chloride 0.9 % 210 mL continuous infusion     8 g 22.7 mL/hr over 11 Hours Intravenous  Once 01/07/18 1644 01/08/18 0402   01/07/18 0800  cefOXitin (MEFOXIN) 8 g in sodium chloride 0.9 % 210 mL continuous infusion  Status:  Discontinued     8 g 12.5 mL/hr over 20 Hours Intravenous Every 24 hours 01/06/18 1125 01/07/18 1520   01/03/18 1800  cefOXitin (MEFOXIN) 3 g in dextrose 5 % 50 mL IVPB     3 g 100 mL/hr over 30 Minutes Intravenous Every 6 hours 01/03/18 1423 01/07/18 0200   12/30/17 0600  tigecycline (TYGACIL) 50 mg in sodium chloride 0.9 % 100 mL IVPB     50 mg 200 mL/hr over 30 Minutes Intravenous Every 12 hours 12/29/17 1747     12/30/17 0200  tigecycline (TYGACIL) 50 mg in sodium chloride 0.9 % 100 mL IVPB  Status:  Discontinued     50 mg 200 mL/hr over 30 Minutes Intravenous Every 12 hours 12/29/17 1345 12/29/17 1747   12/29/17 1430  cefOXitin (MEFOXIN) 2 g in sodium chloride 0.9 % 100 mL IVPB  Status:  Discontinued     2 g 200 mL/hr over 30 Minutes Intravenous Every 6 hours 12/29/17 1345 01/03/18 1423   12/29/17 1400  azithromycin (ZITHROMAX) 500 mg in sodium chloride 0.9 % 250 mL IVPB     500 mg 250 mL/hr over 60 Minutes Intravenous Every 24 hours 12/29/17 1345       12/29/17 1345  tigecycline (TYGACIL) 100 mg in sodium chloride 0.9 % 100 mL IVPB    Note to Pharmacy:  Please schedule zofran 8mg  iv as anti-emetic prior to infusion   100 mg 200 mL/hr over 30 Minutes Intravenous  Once 12/29/17 1345 12/29/17 2138   12/29/17 0600  vancomycin (VANCOCIN) 500 mg in sodium chloride 0.9 % 100 mL IVPB  Status:  Discontinued     500 mg 100 mL/hr over 60 Minutes Intravenous Every 12 hours 12/28/17 1617 12/29/17 0858   12/28/17 2200  ceFEPIme (MAXIPIME) 1 g in sodium chloride 0.9 % 100 mL IVPB  Status:  Discontinued     1 g 200 mL/hr over 30 Minutes Intravenous Every 8 hours 12/28/17 2123 12/29/17 1342   12/28/17 1645  ceFEPIme (MAXIPIME) 2 g in sodium chloride 0.9 % 100 mL IVPB     2 g 200 mL/hr over 30 Minutes Intravenous  Once 12/28/17 1645 12/28/17 1757   12/28/17 1630  ceFEPIme (MAXIPIME) 1 g in sodium chloride 0.9 % 100 mL IVPB  Status:  Discontinued     1 g 200 mL/hr over 30 Minutes Intravenous  Once 12/28/17 1612 12/28/17 1645   12/28/17  1630  vancomycin (VANCOCIN) IVPB 1000 mg/200 mL premix     1,000 mg 200 mL/hr over 60 Minutes Intravenous  Once 12/28/17 1617 12/28/17 1910      Medications:  Scheduled: . benzonatate  200 mg Oral TID  . carvedilol  6.25 mg Oral BID WC  . chlorpheniramine-HYDROcodone  5 mL Oral Q12H  . feeding supplement (ENSURE ENLIVE)  237 mL Oral TID BM  . furosemide  40 mg Intravenous Daily  . guaiFENesin  600 mg Oral BID  . ipratropium-albuterol  3 mL Nebulization BID  . losartan  25 mg Oral Daily  . mouth rinse  15 mL Mouth Rinse BID  . ondansetron (ZOFRAN) IV  4 mg Intravenous Q12H  . polyethylene glycol  17 g Oral Daily  . sodium chloride flush  3 mL Intravenous Q12H    Objective: Vital signs in last 24 hours: Temp:  [96.3 F (35.7 C)-98.1 F (36.7 C)] 97.3 F (36.3 C) (05/01 0846) Pulse Rate:  [39-99] 83 (05/01 0653) Resp:  [16-29] 21 (05/01 0653) BP: (100-159)/(81-105) 147/97 (05/01 0653) SpO2:  [93 %-100 %]  94 % (05/01 0653)   General appearance: alert and no distress Resp: clear to auscultation bilaterally Cardio: regular rate and rhythm GI: normal findings: bowel sounds normal and soft, non-tender  Lab Results Recent Labs    01/06/18 0459 01/07/18 0514  WBC 22.5* 25.4*  HGB 10.8* 11.1*  HCT 31.3* 32.5*  NA 132* 132*  K 4.7 4.4  CL 100* 95*  CO2 25 26  BUN 42* 43*  CREATININE 1.17 1.21   Liver Panel Recent Labs    01/07/18 0514  PROT 6.2*  ALBUMIN 1.4*  AST 79*  ALT 107*  ALKPHOS 120  BILITOT 0.7  BILIDIR 0.3  IBILI 0.4   Sedimentation Rate No results for input(s): ESRSEDRATE in the last 72 hours. C-Reactive Protein No results for input(s): CRP in the last 72 hours.  Microbiology: Recent Results (from the past 240 hour(s))  Culture, body fluid-bottle     Status: None (Preliminary result)   Collection Time: 01/03/18  3:04 PM  Result Value Ref Range Status   Specimen Description PLEURAL RIGHT  Final   Special Requests NONE  Final   Culture   Final    NO GROWTH 4 DAYS Performed at [facility], [address], [place], [place] [address]    Report Status PENDING  Incomplete  Culture, blood (Routine X 2) w Reflex to ID Panel     Status: None (Preliminary result)   Collection Time: 01/06/18  2:00 PM  Result Value Ref Range Status   Specimen Description BLOOD RIGHT HAND  Final   Special Requests   Final    BOTTLES DRAWN AEROBIC AND ANAEROBIC Blood Culture adequate volume   Culture   Final    NO GROWTH < 24 HOURS Performed at [facility], [place] [address], [place], [place] [address]    Report Status PENDING  Incomplete  MRSA PCR Screening     Status: None   Collection Time: 01/07/18  2:58 AM  Result Value Ref Range Status   MRSA by PCR NEGATIVE NEGATIVE Final    Comment:        The GeneXpert MRSA Assay (FDA approved for NASAL specimens only), is one component of a comprehensive MRSA colonization surveillance program. It is not intended to  diagnose MRSA infection nor to guide or monitor treatment for MRSA infections. Performed at [facility], [place]  [address], [place], [place] [address]     Studies/Results: Ct Head Wo Contrast  Result Date: 01/07/2018 CLINICAL DATA:  Initial evaluation for acute altered mental status. EXAM: CT HEAD WITHOUT CONTRAST TECHNIQUE: Contiguous axial images were obtained from the base of the skull through the vertex without intravenous contrast. COMPARISON:  Prior CT from 11/29/2017 FINDINGS: Brain: Cerebral volume stable, and remains within normal limits. There is an acute intraparenchymal hemorrhage measuring approximately 1 cm in size positioned at the right thalamus (series 3, image 14). Minimal surrounding edema without significant mass effect. No intraventricular extension or other complication. No other acute intracranial hemorrhage. No acute large vessel territory infarct. No mass lesion or midline shift. Ventriculomegaly with asymmetric dilatation of the left lateral ventricle, stable. No extra-axial fluid collection. Vascular: No hyperdense vessel. Calcified atherosclerosis at the skull base. Skull: Scalp soft tissues and calvarium within normal limits. Sinuses/Orbits: Globes and orbital soft tissues within normal limits. Moderate opacification of the right ethmoidal air cells. Paranasal sinuses are otherwise clear. Mastoids are largely clear. Other: None. IMPRESSION: 1. Approximate 1 cm acute intraparenchymal hemorrhage centered at the right thalamus without significant edema. 2. Otherwise stable appearance of the brain. Attempt is being made to contact the ordering clinician. Results will be communicated as soon as possible. Electronically Signed   By: Jeannine Boga M.D.   On: 01/07/2018 00:16   Mr Jodene Nam Head Wo Contrast  Result Date: 01/07/2018 CLINICAL DATA:  Cerebral hemorrhage suspected EXAM: MRI HEAD WITHOUT CONTRAST MRA HEAD WITHOUT CONTRAST TECHNIQUE: Multiplanar, multiecho pulse  sequences of the brain and surrounding structures were obtained without intravenous contrast. Angiographic images of the head were obtained using MRA technique without contrast. COMPARISON:  Head CT from yesterday FINDINGS: MRI HEAD FINDINGS Brain: Multiple small acute infarcts that are rounded and linear, seen about the lateral ventricles, juxta cortical white matter, and deep gray nuclei-especially the thalami. These are consistent with acute infarcts, possibly embolic. No hemorrhage. Ventriculomegaly with asymmetric lateral ventricular size, larger on the left, likely developmental and stable since at least 2014. FLAIR hyperintensity around the lateral ventricles is also stable from 2014 and attributed to chronic small vessel ischemia. Known acute infarct in the posterior right thalamus, likely hemorrhagic conversion. No masslike enhancement or abnormal regional vessels. No detected progression since prior. Vascular: Normal flow voids and vascular enhancements. 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 Atheromatous irregularity of the left V4 and mid basilar. 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 No flow limiting stenosis or branch occlusions seen in the anterior circulation. Negative for beading or aneurysm. 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 There are large bilateral posterior communicating arteries. IMPRESSION: 1. Scattered small acute infarcts in the bilateral cerebral white matter and thalami primarily. The small right thalamic hematoma is likely hemorrhagic conversion. 2. No emergent finding on intracranial MRA. 3. Atheromatous irregularity of the left V4 segment and basilar with high-grade mid basilar narrowing 4. Chronic ventriculomegaly and  asymmetric left lateral ventricle dilatation. Chronic small vessel ischemia. Electronically Signed   By: Monte Fantasia M.D.   On: 01/07/2018 13:27   Mr Jeri Cos IO Contrast  Result Date: 01/07/2018 CLINICAL DATA:  Cerebral hemorrhage suspected EXAM: MRI HEAD WITHOUT CONTRAST MRA HEAD WITHOUT CONTRAST TECHNIQUE: Multiplanar, multiecho pulse sequences of the brain and surrounding structures were obtained without  intravenous contrast. Angiographic images of the head were obtained using MRA technique without contrast. COMPARISON:  Head CT from yesterday FINDINGS: MRI HEAD FINDINGS Brain: Multiple small acute infarcts that are rounded and linear, seen about the lateral ventricles, juxta cortical white matter, and deep gray nuclei-especially the thalami. These are consistent with acute infarcts, possibly embolic. No hemorrhage. Ventriculomegaly with asymmetric lateral ventricular size, larger on the left, likely developmental and stable since at least 2014. FLAIR hyperintensity around the lateral ventricles is also stable from 2014 and attributed to chronic small vessel ischemia. Known acute infarct in the posterior right thalamus, likely hemorrhagic conversion. No masslike enhancement or abnormal regional vessels. No detected progression since prior. Vascular: Normal flow voids and vascular enhancements. 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 Atheromatous irregularity of the left V4 and mid basilar. 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 No flow limiting stenosis or branch occlusions seen in the anterior circulation. Negative for beading or aneurysm. 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 There are large bilateral posterior communicating arteries.  IMPRESSION: 1. Scattered small acute infarcts in the bilateral cerebral white matter and thalami primarily. The small right thalamic hematoma is likely hemorrhagic conversion. 2. No emergent finding on intracranial MRA. 3. Atheromatous irregularity of the left V4 segment and basilar with high-grade mid basilar narrowing 4. Chronic ventriculomegaly and asymmetric left lateral ventricle dilatation. Chronic small vessel ischemia. Electronically Signed   By: Monte Fantasia M.D.   On: 01/07/2018 13:27   US Abdomen Complete  Result Date: 01/07/2018 CLINICAL DATA:  Abdominal pain EXAM: ABDOMEN ULTRASOUND COMPLETE COMPARISON:  Abdominal and pelvic CT scan of August 26 2017 FINDINGS: Gallbladder: The gallbladder is adequately distended. It contains echogenic sludge. There is no gallbladder wall thickening or positive sonographic Murphy's sign. There is a small amount of pericholecystic fluid but no gallbladder wall thickening. Common bile duct: Diameter: 5 mm Liver: The hepatic echotexture is normal. There is a cyst in the right lobe measuring 1.7 x 1.6 x 1.3 cm which appears stable. There is no intrahepatic ductal dilation. The surface contour of the liver is normal. Portal vein is patent on color Doppler imaging with normal direction of blood flow towards the liver. IVC: No abnormality visualized. Pancreas: Visualization of the pancreatic head and tail is limited. The pancreatic body is normal. Spleen: Size and appearance within normal limits. Right Kidney: Length: 10.6 cm. There is a marked small amount of perinephric fluid. The renal cortical echotexture is similar to that of the adjacent liver. There is mild hydronephrosis. Left Kidney: Length: 9.4 cm. There is a small amount of perinephric fluid on the left. The renal cortical echotexture is increased similar to that on the right. There is no definite hydronephrosis. Abdominal aorta: No aneurysm visualized. Other findings: There is ascites. There are bilateral  pleural effusions. IMPRESSION: Ascites including pericholecystic fluid and perinephric fluid. Bilateral pleural effusions. Mildly distended gallbladder containing sludge. No sonographic evidence of acute cholecystitis. Normal hepatic echotexture. Stable simple appearing right lobe cyst measuring 1.7 cm in greatest dimension. Increased renal cortical echotexture bilaterally consistent with medical renal disease. Mild right-sided hydronephrosis. Electronically Signed   By: David  Martinique M.D.   On: 01/07/2018 12:02   [place] Ekg Site Rite  Result Date: 01/07/2018 If Site Rite image not attached, placement could not be  confirmed due to current cardiac rhythm.    Assessment/Plan: Intra-parenchymal hemorrhage (R thalamus) HTN Non-tuberculous mycobacterial infection Lung nodules CHF  Total days of antibiotics: 10cefoxitin, tigecycline, azithro  Will continue mycobacteria rx, ask for pharm help- his family is convinced his tygacil is causing his hedaches and anorexia.  Appreciate Neuro f/u Defer his pain mgmt to his primary          Bobby Rumpf MD, FACP Infectious Diseases (pager) [phone] [email or web address] 01/08/2018, 9:12 AM  LOS: 11 days

## 2018-01-08 NOTE — Progress Notes (Signed)
eLink Physician-Brief Progress Note Patient Name: Marquin Patino DOB: [birth year] MRN: [id number]   Date of Service  01/08/2018  HPI/Events of Note  Hypertension - BP = 150/97  eICU Interventions  Will order: 1. Hydralazine 10 mg IV Q 4 hours PRN SBP > 140.      Intervention Category Intermediate Interventions: Hypertension - evaluation and management  Sommer,Steven Eugene 01/08/2018, 6:34 AM

## 2018-01-08 NOTE — Progress Notes (Signed)
Responded to [facility] to assist patient with AD.  Per pt's nurse patient doesn't have capacity.  Therefore AD was not done. Supported family at bedside.  Provided guidance to daughter. Chaplain will follow as needed.  Jaclynn Major, [place], [facility], Pager [phone]

## 2018-01-08 NOTE — Consult Note (Signed)
Physical Medicine and Rehabilitation Consult Reason for Consult: Decreased functional mobility Referring Physician: [facility]   HPI: [name] is a 67 y.o. right-handed male with history of COPD, former smoker, prostate cancer of which patient deferred treatment with PET scan December 2018 showing no mets, hypertension, CAD with mild nonobstructive disease by catheterization 2004.  Per chart review, patient lives alone was independent up through early April.  One level home.  He has a daughter in the area who can be with him as needed.  Patient recently admitted 12/16/2017 until 12/21/2017 for progressive shortness of breath and productive cough.  A CT of the chest at that time showed multiple lung nodules of unknown etiology.  Pulmonary consulted for possible bronchoscopy but lesions were too peripheral.  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  3 sets of AFB smears were negative.  Infectious disease recommended discontinuing all antibiotics at that time and follow-up as outpatient.  Patient was last seen by Dr. Megan Salon infectious disease 12/24/2017 at which time the only isolate from his sputum was Candida albicans which was representative of normal respiratory flora.  Presented 12/28/2017 with increasing shortness of breath and cough.  Chest x-ray showed developing infiltrates in the upper and lower lobes.  Patient placed on broad-spectrum antibiotics.  He had another culture come back that was AFB positive but TB PCR was negative.  Also noted small pleural effusion and monitored.  Echocardiogram demonstrated severe systolic congestive heart failure with no new findings at that time ejection fraction of 30%.  Noted altered mental status a CT of the head reviewed, showing right IPH.  Per report, approximate 1 cm acute intraparenchymal hemorrhage centered at the right thalamus without significant edema.   Patient with abdominal pain ultrasound of the abdomen showed ascites including pericholecystic fluid and perinephritic fluid.  Underwent thoracentesis with 140 mL yield of pleural fluid.  A follow-up MRI of the brain showed scattered small acute infarcts in the bilateral cerebral white matter with thalami primarily.  MRA with no emergent finding.  Neurology follow-up with conservative care close monitoring of blood pressure.  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 Per nursing, patient sleepy from activity earlier, but otherwise oriented. Physical therapy evaluation completed 01/08/2018 with recommendations of physical medicine rehab consult.   Review of Systems  Unable to perform ROS: Mental acuity   Past Medical History:  Diagnosis Date  . BPH with urinary obstruction   . CAD (coronary artery disease)    per cardiac cath 01-08-2003  non-obstructive mild cad w/ normal LVSF  . COPD (chronic obstructive pulmonary disease) ([place])    per pulmologist note in 2009  GOLD 1  . DDD (degenerative disc disease), cervical    C5-7, C7-T1  . Diverticulosis of colon   . Elevated PSA   . GERD (gastroesophageal reflux disease)   . History of adenomatous polyp of colon   . History of hypertension   . History of precordial chest pain    as of 07-22-2017  denies cardiac S&S  . Prostate cancer ([place])   . Wears glasses    Past Surgical History:  Procedure Laterality Date  . CARDIAC CATHETERIZATION  01-08-2003   dr Lyndel Safe   mild non-obstructive CAD,  normal LVSF, ef 55% (positive myoview)  . COLONOSCOPY  last one 12-14-2015  . IR THORACENTESIS ASP PLEURAL SPACE W/IMG GUIDE  01/03/2018  . PROSTATE BIOPSY N/A 07/25/2017   Procedure:  BIOPSY TRANSRECTAL ULTRASONIC PROSTATE (TUBP);  Surgeon: Alexis Frock, MD;  Location: [facility];  Service: Urology;  Laterality: N/A;   Family History  Problem Relation Age of Onset  . Heart disease  Brother   . Colon cancer Brother   . Heart disease Sister   . Cancer Sister        unknown  . Cancer Mother        unknown   Social History:  reports that he quit smoking about 30 years ago. His smoking use included cigarettes. He has a 30.00 pack-year smoking history. He quit smokeless tobacco use about 30 years ago. His smokeless tobacco use included snuff. He reports that he does not drink alcohol or use drugs. Allergies: No Known Allergies Medications Prior to Admission  Medication Sig Dispense Refill  . acetaminophen (TYLENOL) 500 MG tablet Take 500 mg by mouth every 6 (six) hours as needed for headache (pain).     Marland Kitchen albuterol (PROVENTIL HFA;VENTOLIN HFA) 108 (90 Base) MCG/ACT inhaler Inhale 2 puffs into the lungs every 6 (six) hours as needed for wheezing or shortness of breath. 1 Inhaler 0  . aspirin 81 MG chewable tablet Chew 1 tablet (81 mg total) by mouth daily. 30 tablet 1  . dextromethorphan-guaiFENesin (MUCINEX DM) 30-600 MG 12hr tablet Take 1 tablet by mouth 2 (two) times daily. 14 tablet 0  . Multiple Vitamin (MULTIVITAMIN WITH MINERALS) TABS tablet Take 1 tablet by mouth daily.    Marland Kitchen OVER THE COUNTER MEDICATION Take 1 tablet by mouth 2 (two) times daily. Super Beta Prostate supplement    . OVER THE COUNTER MEDICATION Apply 1 application topically daily as needed (arthritis pain). Over the counter arthritis pain cream    . sildenafil (REVATIO) 20 MG tablet Take 60 mg by mouth daily as needed (erectile dysfunction).   1  . benzonatate (TESSALON) 100 MG capsule Take 1 capsule (100 mg total) by mouth every 8 (eight) hours. (Patient not taking: Reported on 12/08/2017) 21 capsule 0  . traMADol (ULTRAM) 50 MG tablet Take 1 tablet (50 mg total) by mouth every 6 (six) hours as needed. (Patient not taking: Reported on 12/08/2017) 15 tablet 0    Home: Home Living Family/patient expects to be discharged to:: Private residence Living Arrangements: Alone Available Help at Discharge:  Friend(s), Available PRN/intermittently Type of Home: Apartment Home Access: Level entry Home Layout: One level Bathroom Shower/Tub: Multimedia programmer: Standard Home Equipment: None  Functional History: Prior Function Level of Independence: Independent Functional Status:  Mobility: Bed Mobility Overal bed mobility: Needs Assistance Bed Mobility: Supine to Sit Supine to sit: HOB elevated, Mod assist Sit to supine: Min assist General bed mobility comments: able to bring legs off bed, but assist to lift trunk Transfers Overall transfer level: Needs assistance Equipment used: Rolling walker (2 wheeled) Transfers: Sit to/from Stand Sit to Stand: Mod assist, Max assist General transfer comment: lifting and lowering help Ambulation/Gait Ambulation/Gait assistance: Mod assist, +2 safety/equipment(assist for IV) Ambulation Distance (Feet): 15 Feet Assistive device: Rolling walker (2 wheeled) Gait Pattern/deviations: Step-through pattern, Step-to pattern, Decreased stride length, Drifts right/left, Narrow base of support General Gait Details: unsteady with walker and decreased balance during gait, SpO2 91% on RA, HR 88 Gait velocity: decr Gait velocity interpretation: 1.31 - 2.62 ft/sec, indicative of limited community ambulator    ADL: ADL Overall ADL's : Needs assistance/impaired Eating/Feeding: Independent Grooming: Wash/dry hands, Wash/dry face, Oral care, Supervision/safety, Sitting Upper Body Bathing: Minimal assistance Lower Body Bathing:  Minimal assistance, Moderate assistance Upper Body Dressing : Minimal assistance Lower Body Dressing: Moderate assistance Toilet Transfer: Minimal assistance Toileting- Clothing Manipulation and Hygiene: Minimal assistance Functional mobility during ADLs: Minimal assistance  Cognition: Cognition Overall Cognitive Status: Impaired/Different from baseline Orientation Level: Oriented to person, Oriented to place, Disoriented  to time, Disoriented to situation Cognition Arousal/Alertness: Awake/alert Behavior During Therapy: Flat affect Overall Cognitive Status: Impaired/Different from baseline Area of Impairment: Attention, Safety/judgement Current Attention Level: Sustained Safety/Judgement: Decreased awareness of safety, Decreased awareness of deficits General Comments: very quiet/weak voice, doesn't believe what "they" say that he is sick  Blood pressure (!) 133/98, pulse 86, temperature 97.6 F (36.4 C), temperature source Oral, resp. rate (!) 21, height 5\' 4"  (1.626 m), weight 50.6 kg (111 lb 9.6 oz), SpO2 94 %. Physical Exam  Vitals reviewed. Constitutional: He appears well-developed.  Frail  HENT:  Head: Normocephalic and atraumatic.  Eyes: EOM are normal. Right eye exhibits no discharge. Left eye exhibits no discharge.  Neck: Normal range of motion. Neck supple. No thyromegaly present.  Cardiovascular: Normal rate and regular rhythm.  Respiratory:  Limited inspiratory effort but clear to auscultation +Chest tube  GI: Soft. Bowel sounds are normal. He exhibits no distension.  Musculoskeletal:  No edema or tenderness in extremities  Neurological:  Lethargic Nonverbal and not following commands, trace movements noted in left hand and foot  Skin: Skin is warm and dry.  Psychiatric: His affect is blunt. His speech is delayed. Cognition and memory are impaired. He is noncommunicative. He is inattentive.    No results found for this or any previous visit (from the past 24 hour(s)). Ct Head Wo Contrast  Result Date: 01/07/2018 CLINICAL DATA:  Initial evaluation for acute altered mental status. EXAM: CT HEAD WITHOUT CONTRAST TECHNIQUE: Contiguous axial images were obtained from the base of the skull through the vertex without intravenous contrast. COMPARISON:  Prior CT from 11/29/2017 FINDINGS: Brain: Cerebral volume stable, and remains within normal limits. There is an acute intraparenchymal  hemorrhage measuring approximately 1 cm in size positioned at the right thalamus (series 3, image 14). Minimal surrounding edema without significant mass effect. No intraventricular extension or other complication. No other acute intracranial hemorrhage. No acute large vessel territory infarct. No mass lesion or midline shift. Ventriculomegaly with asymmetric dilatation of the left lateral ventricle, stable. No extra-axial fluid collection. Vascular: No hyperdense vessel. Calcified atherosclerosis at the skull base. Skull: Scalp soft tissues and calvarium within normal limits. Sinuses/Orbits: Globes and orbital soft tissues within normal limits. Moderate opacification of the right ethmoidal air cells. Paranasal sinuses are otherwise clear. Mastoids are largely clear. Other: None. IMPRESSION: 1. Approximate 1 cm acute intraparenchymal hemorrhage centered at the right thalamus without significant edema. 2. Otherwise stable appearance of the brain. Attempt is being made to contact the ordering clinician. Results will be communicated as soon as possible. Electronically Signed   By: Jeannine Boga M.D.   On: 01/07/2018 00:16   Mr Jodene Nam Head Wo Contrast  Result Date: 01/07/2018 CLINICAL DATA:  Cerebral hemorrhage suspected EXAM: MRI HEAD WITHOUT CONTRAST MRA HEAD WITHOUT CONTRAST TECHNIQUE: Multiplanar, multiecho pulse sequences of the brain and surrounding structures were obtained without intravenous contrast. Angiographic images of the head were obtained using MRA technique without contrast. COMPARISON:  Head CT from yesterday FINDINGS: MRI HEAD FINDINGS Brain: Multiple small acute infarcts that are rounded and linear, seen about the lateral ventricles, juxta cortical white matter, and deep gray nuclei-especially the thalami. These are consistent with acute infarcts,  possibly embolic. No hemorrhage. Ventriculomegaly with asymmetric lateral ventricular size, larger on the left, likely developmental and stable  since at least 2014. FLAIR hyperintensity around the lateral ventricles is also stable from 2014 and attributed to chronic small vessel ischemia. Known acute infarct in the posterior right thalamus, likely hemorrhagic conversion. No masslike enhancement or abnormal regional vessels. No detected progression since prior. Vascular: Normal flow voids and vascular enhancements. 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 Atheromatous irregularity of the left V4 and mid basilar. 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 No flow limiting stenosis or branch occlusions seen in the anterior circulation. Negative for beading or aneurysm. 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 There are large bilateral posterior communicating arteries. IMPRESSION: 1. Scattered small acute infarcts in the bilateral cerebral white matter and thalami primarily. The small right thalamic hematoma is likely hemorrhagic conversion. 2. No emergent finding on intracranial MRA. 3. Atheromatous irregularity of the left V4 segment and basilar with high-grade mid basilar narrowing 4. Chronic ventriculomegaly and asymmetric left lateral ventricle dilatation. Chronic small vessel ischemia. Electronically Signed   By: Monte Fantasia M.D.   On: 01/07/2018 13:27   Mr Jeri Cos KD Contrast  Result Date: 01/07/2018 CLINICAL DATA:  Cerebral hemorrhage suspected EXAM: MRI HEAD WITHOUT CONTRAST MRA HEAD WITHOUT CONTRAST TECHNIQUE: Multiplanar, multiecho pulse sequences of the brain and surrounding structures were obtained without intravenous contrast. Angiographic images of the head were obtained using MRA technique without contrast. COMPARISON:  Head CT from yesterday FINDINGS: MRI HEAD FINDINGS Brain: Multiple  small acute infarcts that are rounded and linear, seen about the lateral ventricles, juxta cortical white matter, and deep gray nuclei-especially the thalami. These are consistent with acute infarcts, possibly embolic. No hemorrhage. Ventriculomegaly with asymmetric lateral ventricular size, larger on the left, likely developmental and stable since at least 2014. FLAIR hyperintensity around the lateral ventricles is also stable from 2014 and attributed to chronic small vessel ischemia. Known acute infarct in the posterior right thalamus, likely hemorrhagic conversion. No masslike enhancement or abnormal regional vessels. No detected progression since prior. Vascular: Normal flow voids and vascular enhancements. 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 Atheromatous irregularity of the left V4 and mid basilar. 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 No flow limiting stenosis or branch occlusions seen in the anterior circulation. Negative for beading or aneurysm. 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 There are large bilateral posterior communicating arteries. IMPRESSION: 1. Scattered small acute infarcts in the bilateral cerebral white matter and thalami primarily. The small right thalamic hematoma is likely hemorrhagic conversion. 2. No emergent finding on intracranial MRA. 3. Atheromatous irregularity of the left V4 segment and basilar with high-grade mid basilar narrowing 4. Chronic ventriculomegaly and asymmetric left lateral ventricle dilatation. Chronic small vessel ischemia. Electronically Signed   By: Monte Fantasia M.D.   On: 01/07/2018 13:27   US Abdomen Complete  Result Date: 01/07/2018 CLINICAL DATA:  Abdominal pain EXAM: ABDOMEN ULTRASOUND  COMPLETE COMPARISON:  Abdominal and pelvic CT scan of August 26 2017 FINDINGS: Gallbladder: The gallbladder is adequately distended. It contains echogenic sludge. There is  no gallbladder wall thickening or positive sonographic Murphy's sign. There is a small amount of pericholecystic fluid but no gallbladder wall thickening. Common bile duct: Diameter: 5 mm Liver: The hepatic echotexture is normal. There is a cyst in the right lobe measuring 1.7 x 1.6 x 1.3 cm which appears stable. There is no intrahepatic ductal dilation. The surface contour of the liver is normal. Portal vein is patent on color Doppler imaging with normal direction of blood flow towards the liver. IVC: No abnormality visualized. Pancreas: Visualization of the pancreatic head and tail is limited. The pancreatic body is normal. Spleen: Size and appearance within normal limits. Right Kidney: Length: 10.6 cm. There is a marked small amount of perinephric fluid. The renal cortical echotexture is similar to that of the adjacent liver. There is mild hydronephrosis. Left Kidney: Length: 9.4 cm. There is a small amount of perinephric fluid on the left. The renal cortical echotexture is increased similar to that on the right. There is no definite hydronephrosis. Abdominal aorta: No aneurysm visualized. Other findings: There is ascites. There are bilateral pleural effusions. IMPRESSION: Ascites including pericholecystic fluid and perinephric fluid. Bilateral pleural effusions. Mildly distended gallbladder containing sludge. No sonographic evidence of acute cholecystitis. Normal hepatic echotexture. Stable simple appearing right lobe cyst measuring 1.7 cm in greatest dimension. Increased renal cortical echotexture bilaterally consistent with medical renal disease. Mild right-sided hydronephrosis. Electronically Signed   By: David  Martinique M.D.   On: 01/07/2018 12:02   [place] Ekg Site Rite  Result Date: 01/07/2018 If Site Rite image not attached, placement  could not be confirmed due to current cardiac rhythm.    Assessment/Plan: Diagnosis: Small acute infarcts in the bilateral cerebral white matter Labs and images independently reviewed.  Records reviewed and summated above.  1. Does the need for close, 24 hr/day medical supervision in concert with the patient's rehab needs make it unreasonable for this patient to be served in a less intensive setting? Yes  2. Co-Morbidities requiring supervision/potential complications: mycobacteria (cont abx per ID), ascites (pericentesis if necessary), severe systolic CHF (Monitor in accordance with increased physical activity and avoid UE resistance excercises), COPD (monitor RR and O2 sats with increased exertion), former smoker, prostate cancer, HTN (monitor and provide prns in accordance with increased physical exertion and pain, wean IV meds when possible), CAD (cont meds), tachypnea (monitor RR and O2 Sats with increased physical exertion), Tachycardia (monitor in accordance with pain and increasing activity), hyponatremia (cont to monitor, treat if necessary), leukocytosis (cont to monitor for signs and symptoms of infection, further workup if indicated), ABLA (transfuse if necessary to ensure appropriate perfusion for increased activity tolerance), AKI (avoid nephrotoxic meds) 3. Due to bladder management, safety, skin/wound care, disease management, medication administration, pain management and patient education, does the patient require 24 hr/day rehab nursing? Yes 4. Does the patient require coordinated care of a physician, rehab nurse, PT (1-2 hrs/day, 5 days/week), OT (1-2 hrs/day, 5 days/week) and SLP (1-2 hrs/day, 5 days/week) to address physical and functional deficits in the context of the above medical diagnosis(es)? Yes Addressing deficits in the following areas: balance, endurance, locomotion, strength, transferring, bowel/bladder control, bathing, dressing, toileting, cognition, speech, swallowing  and psychosocial support 5. Can the patient actively participate in an intensive therapy program of at least 3 hrs of therapy per day at least 5 days per week? In the future 6. The potential for patient to make measurable gains while on inpatient rehab is excellent 7. Anticipated functional outcomes upon discharge from inpatient  rehab are supervision and min assist  with PT, supervision and min assist with OT, modified independent and supervision with SLP. 8. Estimated rehab length of stay to reach the above functional goals is: 12-17 days. 9. Anticipated D/C setting: Home 10. Anticipated post D/C treatments: HH therapy and Home excercise program 11. Overall Rehab/Functional Prognosis: good  RECOMMENDATIONS: This patient's condition is appropriate for continued rehabilitative care in the following setting: CIR when medically stable and able to tolerate 3 hours of therapy/day. Patient has agreed to participate in recommended program. Potentially Note that insurance prior authorization may be required for reimbursement for recommended care.  Comment: Rehab Admissions Coordinator to follow up.   I have personally performed a face to face diagnostic evaluation, including, but not limited to relevant history and physical exam findings, of this patient and developed relevant assessment and plan.  Additionally, I have reviewed and concur with the physician assistant's documentation above.   Delice Lesch, MD, ABPMR Lavon Paganini Angiulli, PA-C 01/08/2018

## 2018-01-08 NOTE — Progress Notes (Deleted)
    Request was received for TEE. Dr. Percival Spanish has been following the patient for cardiology and states that pt is currenlty unable to consent

## 2018-01-08 NOTE — Progress Notes (Signed)
    A request for TEE was received. Dr. Percival Spanish has been following the patient for cardiology and states that the patient is currently unable to give consent for or to cooperate with this procedure. The procedure has been cancelled for now.   Daune Perch, AGNP-C [facility] 01/08/2018  6:00 PM Pager: [phone]

## 2018-01-09 ENCOUNTER — Inpatient Hospital Stay (HOSPITAL_COMMUNITY): Payer: Medicare Other

## 2018-01-09 DIAGNOSIS — I618 Other nontraumatic intracerebral hemorrhage: Secondary | ICD-10-CM

## 2018-01-09 LAB — URINALYSIS, ROUTINE W REFLEX MICROSCOPIC
BILIRUBIN URINE: NEGATIVE
Bacteria, UA: NONE SEEN
Glucose, UA: NEGATIVE mg/dL
Ketones, ur: NEGATIVE mg/dL
Leukocytes, UA: NEGATIVE
Nitrite: NEGATIVE
PH: 7 (ref 5.0–8.0)
Protein, ur: NEGATIVE mg/dL
SPECIFIC GRAVITY, URINE: 1.008 (ref 1.005–1.030)

## 2018-01-09 LAB — COMPREHENSIVE METABOLIC PANEL
ALT: 62 U/L (ref 17–63)
AST: 36 U/L (ref 15–41)
Albumin: 1.6 g/dL — ABNORMAL LOW (ref 3.5–5.0)
Alkaline Phosphatase: 110 U/L (ref 38–126)
Anion gap: 7 (ref 5–15)
BILIRUBIN TOTAL: 1.1 mg/dL (ref 0.3–1.2)
BUN: 37 mg/dL — AB (ref 6–20)
CO2: 29 mmol/L (ref 22–32)
CREATININE: 1.31 mg/dL — AB (ref 0.61–1.24)
Calcium: 8.5 mg/dL — ABNORMAL LOW (ref 8.9–10.3)
Chloride: 99 mmol/L — ABNORMAL LOW (ref 101–111)
GFR calc Af Amer: >60 mL/min (ref >60)
GFR, EST NON AFRICAN AMERICAN: 55 mL/min — AB (ref >60)
Glucose, Bld: 134 mg/dL — ABNORMAL HIGH (ref 65–99)
POTASSIUM: 4.5 mmol/L (ref 3.5–5.1)
Sodium: 135 mmol/L (ref 135–145)
TOTAL PROTEIN: 6 g/dL — AB (ref 6.5–8.1)

## 2018-01-09 LAB — LACTIC ACID, PLASMA
LACTIC ACID, VENOUS: 1.3 mmol/L (ref 0.5–1.9)
Lactic Acid, Venous: 1.4 mmol/L (ref 0.5–1.9)

## 2018-01-09 LAB — CALCIUM, IONIZED: CALCIUM, IONIZED, SERUM: 5.2 mg/dL (ref 4.5–5.6)

## 2018-01-09 LAB — CULTURE, FUNGUS WITHOUT SMEAR

## 2018-01-09 MED ORDER — MORPHINE BOLUS VIA INFUSION
5.0000 mg | INTRAVENOUS | Status: DC | PRN
  Filled 2018-01-09: qty 20

## 2018-01-09 MED ORDER — LORAZEPAM BOLUS VIA INFUSION
2.0000 mg | INTRAVENOUS | Status: DC | PRN
  Filled 2018-01-09: qty 5

## 2018-01-09 MED ORDER — SODIUM CHLORIDE 0.9 % IV SOLN
500.0000 mg | Freq: Four times a day (QID) | INTRAVENOUS | Status: DC
  Administered 2018-01-10: 500 mg via INTRAVENOUS
  Filled 2018-01-09 (×2): qty 500

## 2018-01-09 MED ORDER — MANNITOL 20 % IV SOLN
25.0000 g | Freq: Once | Status: AC
  Administered 2018-01-09: 25 g via INTRAVENOUS
  Filled 2018-01-09: qty 125

## 2018-01-09 MED ORDER — HALOPERIDOL LACTATE 5 MG/ML IJ SOLN: 2.0000 mg | Freq: Once | INTRAMUSCULAR | Status: DC

## 2018-01-09 MED ORDER — LORAZEPAM 2 MG/ML IJ SOLN
2.0000 mg | INTRAMUSCULAR | Status: DC | PRN
  Administered 2018-01-10 – 2018-01-13 (×3): 2 mg via INTRAVENOUS
  Filled 2018-01-09 (×3): qty 1

## 2018-01-09 MED ORDER — MORPHINE SULFATE (PF) 4 MG/ML IV SOLN
2.0000 mg | INTRAVENOUS | Status: DC | PRN
  Administered 2018-01-10 – 2018-01-15 (×9): 2 mg via INTRAVENOUS
  Filled 2018-01-09 (×9): qty 1

## 2018-01-09 MED ORDER — MANNITOL 25 % IV SOLN
INTRAVENOUS | Status: AC
  Filled 2018-01-09: qty 50

## 2018-01-09 MED ORDER — DEXTROSE 5 % IV SOLN
INTRAVENOUS | Status: DC
  Administered 2018-01-09: 17:00:00 via INTRAVENOUS

## 2018-01-09 NOTE — Progress Notes (Signed)
PULMONARY / CRITICAL CARE MEDICINE   Name: [name] MRN: [id number] DOB: 27-Oct-1950    ADMISSION DATE:  12/28/2017 CONSULTATION DATE: January 07, 2018   REFERRING MD:  Dr Jerrell Belfast  CHIEF COMPLAINT: Intraparenchymal hemorrhage  HISTORY OF PRESENT ILLNESS:   67 year old male with past medical history as below, which is significant for COPD, nonobstructive coronary artery disease, prostate cancer (Deferred therapy, PET in 08/2107 with no evidence of mets), and hypertension. was recently hospitalized for recent productive cough, shortness of breath, wheezing and chest pain.  A CT showed some new peripheral lung nodules. One of 3 AFB specimens was positive at that time. ID was consulted and recommended DC antibiotics. He had nodule biopsy done 4/11. No organisms identified. Cytology was pending. He was then admitted again to [facility] on 4/20 for worsening cough. He had another culture come back AFB positive, but mTB PCR was negative.  ID was again consulted and have been directing ABX.  Also developed small pleural effusion which was sampled. Found to be an exudate without lymph predominance making it unlikely to be related to NTM infection. He also had an echo done 4/28 which demonstrated severe systolic CHF, which was a new finding at the time (LVEF 25-30%). Around that same time he became lethargic, which persisted for several hours. He underwent contrast CT of the head, which identified a 10 mm acute IPH centered at the right thalamus without significant edema. He was seen by neurology who recommended transfer to ICU for tight BP control. PCCM consulted for medical care in ICU.   SUBJECTIVE:  Moved back to ICU this morning r/t worsening mental status.  CT head revealed substantial progression of ICH with edema, midline shift and large intraventricular extension of hemorrhage.  Protecting airway currently.    VITAL SIGNS: BP (!) 150/98   Pulse 75   Temp 97.7 F (36.5 C) (Axillary)   Resp 16    Ht 5\' 4"  (1.626 m)   Wt 50.6 kg (111 lb 9.6 oz)   SpO2 99%   BMI 19.16 kg/m    INTAKE / OUTPUT: I/O last 3 completed shifts: In: 1130.5 [P.O.:50; I.V.:3; Other:315; IV Piggyback:762.5] Out: 2000 [Urine:2000]  PHYSICAL EXAMINATION: General:  Frail, thin male who appears older than stated age, much different mental status than yesterday, somnolent  Neuro:  Lethargic, opens eyes to stimulation, does not follow commands, repositions himself in bed  HEENT:  [place]/AT, PERRL, no JVD Cardiovascular:  RRR, no MRG Lungs: resps even non labored on RA, protecting airway  Abdomen:   Non-distended. Soft.  Musculoskeletal:  Frail, no edema  Skin: Grossly intact  LABS:  BMET Recent Labs  Lab 01/06/18 0459 01/07/18 0514 01/09/18 1026  NA 132* 132* 135  K 4.7 4.4 4.5  CL 100* 95* 99*  CO2 25 26 29   BUN 42* 43* 37*  CREATININE 1.17 1.21 1.31*  GLUCOSE 104* 88 134*    Electrolytes Recent Labs  Lab 01/05/18 0607 01/06/18 0459 01/07/18 0514 01/09/18 1026  CALCIUM 8.4* 8.2* 8.5* 8.5*  MG 2.0  --   --   --     CBC Recent Labs  Lab 01/05/18 0607 01/06/18 0459 01/07/18 0514  WBC 11.5* 22.5* 25.4*  HGB 12.3* 10.8* 11.1*  HCT 35.5* 31.3* 32.5*  PLT 591* 643* 627*    Coag's No results for input(s): APTT, INR in the last 168 hours.  Sepsis Markers Recent Labs  Lab 01/09/18 1026 01/09/18 1307  LATICACIDVEN 1.3 1.4    ABG Recent  Labs  Lab 01/06/18 1530  PHART 7.471*  PCO2ART 33.8  PO2ART 84.4    Liver Enzymes Recent Labs  Lab 01/07/18 0514 01/09/18 1026  AST 79* 36  ALT 107* 62  ALKPHOS 120 110  BILITOT 0.7 1.1  ALBUMIN 1.4* 1.6*    Cardiac Enzymes No results for input(s): TROPONINI, PROBNP in the last 168 hours.  Glucose No results for input(s): GLUCAP in the last 168 hours.  Imaging Ct Head Wo Contrast  Addendum Date: 01/09/2018   ADDENDUM REPORT: 01/09/2018 11:41 ADDENDUM: Critical Value/emergent results were called by telephone at the time of  interpretation on 01/09/2018 at 1133 hours to Dr. Antony Contras , who verbally acknowledged these results. We discussed the consideration of septic emboli given this constellation of findings, although so far it seems to be only the one thalamic lacune which has hemorrhaged. Electronically Signed   By: Genevie Ann M.D.   On: 01/09/2018 11:41   Result Date: 01/09/2018 CLINICAL DATA:  67 year old male with small acute intra-axial hemorrhage in the right thalamus detected on noncontrast CT, and multiple scattered acute infarcts in both hemispheres on MRI. Increased confusion today. EXAM: CT HEAD WITHOUT CONTRAST TECHNIQUE: Contiguous axial images were obtained from the base of the skull through the vertex without intravenous contrast. COMPARISON:  Brain MRI and intracranial MRA 01/07/2018. Head CT 01/06/2018, and earlier. FINDINGS: Brain: Substantial progression of the right thalamic hemorrhage since 01/07/2018. New moderate to large intraventricular extension of blood. Estimated intra-axial blood volume is now 20 milliliters (versus approximately 1 milliliter on the initial head CT), now encompassing 38 x 30 x 35 millimeters (AP by transverse by CC). Confluent surrounding edema. New mild ventriculomegaly including evidence of transependymal edema along the left temporal horn. New leftward midline shift of 9 millimeters. Basilar cisterns remain patent. The superimposed small scattered bilateral mostly white matter infarcts appear stable to the diffusion imaging on 01/07/2018. No superimposed cortical cytotoxic edema is identified. Vascular: Calcified atherosclerosis at the skull base. Skull: No acute osseous abnormality identified. Sinuses/Orbits: Stable and clear aside from unchanged posterior right ethmoid opacification. Other: Mildly Disconjugate gaze. No other acute orbit or scalp soft tissue finding. IMPRESSION: 1. Substantial progression of the right thalamic hemorrhage since 01/06/2018 with intra-axial blood volume  now estimated at 20 mL. Confluent surrounding edema and new leftward midline shift of 9 mm. 2. New moderate to large intraventricular extension of hemorrhage with acute ventriculomegaly and mild transependymal edema. 3. Basilar cisterns remain normal. 4. Scattered small infarcts demonstrated by MRI appears stable by CT. No new infarct identified. Electronically Signed: By: Genevie Ann M.D. On: 01/09/2018 11:31   Dg Chest Port 1 View  Result Date: 01/09/2018 CLINICAL DATA:  Cough EXAM: PORTABLE CHEST 1 VIEW COMPARISON:  January 04, 2018 FINDINGS: There is somewhat less consolidation in the right upper lobe compared to recent study. There is a small right pleural effusion with patchy consolidation in the lateral right base. There is also new subtle consolidation in the medial right base. There is mild atelectatic change in the left mid lung, stable. Heart size and pulmonary vascularity are normal. No adenopathy. No bone lesions. IMPRESSION: 1. New areas of patchy opacity right base. Persistent small right pleural effusion. 2. Less consolidation right upper lobe compared to most recent study. 3.  Stable left midlung atelectasis. 4.  Stable cardiac silhouette. Electronically Signed   By: Lowella Grip III M.D.   On: 01/09/2018 10:24     STUDIES:  CT head 4/29 > 10 mm  acute IPH centered at the right thalamus without significant edema. Echo 4/29 > The cavity size was normal. Systolic function was severely reduced. The estimated ejection fraction was in the range of 25% to 30%. Diffuse hypokinesis. Doppler parameters are consistent with abnormal left ventricular relaxation (grade 1 diastolic dysfunction). Moderate MR. CT head 5/2>>> 1. Substantial progression of the right thalamic hemorrhage since 01/06/2018 with intra-axial blood volume now estimated at 20 mL. Confluent surrounding edema and new leftward midline shift of 9 mm. 2. New moderate to large intraventricular extension of hemorrhage with acute  ventriculomegaly and mild transependymal edema. 3. Basilar cisterns remain normal. 4. Scattered small infarcts demonstrated by MRI appears stable by CT. No new infarct identified.  CULTURES: Blood 4/20: neg Pleural fluid culture 4/26 >NEG Pleural fluid acid fast smear 4/26 > Pleural fluid fungal culture 4/26 >  Blood culture 4/29 >  ANTIBIOTICS: Cefoxitin Tigecycline Azitrhomycin  SIGNIFICANT EVENTS: 4/20 admit 4/26 thoracentesis (exudate, no lymph prominence) 4/29 Card consult, new HF noted on echo 4/29 lethargy. Head CT showed small IPH. Transferred to ICU for antiHTN gtt.   LINES/TUBES:   DISCUSSION:  67 year old male with recent eval for pulmonary infection initially concerning for TB, now being treated for NTM.  Admitted 4/20 for lobar PNA. ABX directed by ID. Course c/b thalamic ICH.  Was improving slowly and had acute change in mental status 5/2. CT revealed substantial progression of ICH with edema, midline shift and large intraventricular extension of hemorrhage.    ASSESSMENT / PLAN:   Large ICH with new progression 5/2 now with edema, midline shift, intraventricular extension - very poor prognosis.   ?? septic emboli per neuro.  Significant progressive AMS  PLAN -  Neuro following  Discussing goals of care with family  Protecting airway for now but suspect he will deteriorate  Mannitol per neuro  ? TEE - see below   Lobar pneumonia  Non-TB mycobacterial pulmonary infection PLAN -  Cont abx per ID  Follow pleural fluid cultures - pending - neg to date  Monitor airway protection as above  O2 as needed  F/u CXR   Chronic systolic CHF (LVEF 62-26%). Diastolic dysfunction grade 1 as well on echo Hypertension PLAN -  Cardiology following  IV hydralazine, metoprolol PRN  BP control  Goal SBP <140 Consider TEE if family decides to pursue aggressive care   Asthma/COPD without acute exacerbation PLAN -  Scheduled, PRN nebs No role steroids  Abdominal  Pain - US abdomen - neg acute   Transaminitis - follow LFT  Prostate Cancer Dx one year ago. Has deferred treatment. PET in 08/2017 shows no mets.  -Outpatient follow up if improves     FAMILY  - Updates: multiple family members updated 5/2 by Dr. Lynetta Mare.  Unstable family dynamics.   - Inter-disciplinary family meet or Palliative Care meeting due by: 5/6   Nickolas Madrid, NP 01/09/2018  2:21 PM Pager: [phone] or [phone]

## 2018-01-09 NOTE — Progress Notes (Signed)
  Speech Language Pathology Treatment: Dysphagia  Patient Details Name: [name] MRN: [id number] DOB: 1951-06-01 Today's Date: 01/09/2018 Time: 0102-7253 SLP Time Calculation (min) (ACUTE ONLY): 10 min  Assessment / Plan / Recommendation Clinical Impression  Pt awake, opens eyes to voice but otherwise poorly responsive. Max verbal and tactile cues given to encourage PO intake, cup, spoon, wet straw to lips. Repositioning and tactile cues offered also to encourage intake of meds with RN. Pt seemed aware of efforts but did not accept PO. Family not present today. Will continue efforts.   HPI HPI: 67 year old male with recent eval for pulmonary infection initially concerning for TB, now being treated for NTM. Admitted 4/20 for lobar PNA. ABX directed by ID. Pleural effusion was exudate, not lymph predominant. Course now complicated by new onset CHF with EF 25-30%. Also 4/29 he developed some lethargy and was found to have small ICH in right thalmus/       SLP Plan  Continue with current plan of care       Recommendations  Diet recommendations: Dysphagia 2 (fine chop);Thin liquid Liquids provided via: Cup;Straw Medication Administration: Whole meds with liquid Compensations: Slow rate;Small sips/bites                Oral Care Recommendations: Oral care BID Follow up Recommendations: 24 hour supervision/assistance Plan: Continue with current plan of care       GO               [facility], MA CCC-SLP [phone]  Lynann Beaver 01/09/2018, 10:50 AM

## 2018-01-09 NOTE — Progress Notes (Signed)
EEG Completed; Results Pending  

## 2018-01-09 NOTE — Progress Notes (Signed)
At bedside to attempt to place PICC . Family at bedside and they have decided  not to have picc placed because they may be considering comfort care.

## 2018-01-09 NOTE — Progress Notes (Signed)
PROGRESS NOTE    Patient: [name]     PCP: Bernerd Limbo, MD                    DOB: Dec 22, 1950            DOA: 12/28/2017 URK:[id number]             DOS: 01/09/2018, 11:28 AM   LOS: 12 days   Date of Service: The patient was seen and examined on 01/09/2018  Subjective:   Was seen and examined this morning, confused, agitated, vitals, stable, Due to agitation PICC line could not be placed, will reattempted today.  ----------------------------------------------------------------------------------------------------------------------  Brief Narrative:   67 year old male with past medical history as below, which is significant for COPD, nonobstructive coronary artery disease, prostate cancer (Deferred therapy, PET in 08/2107 with no evidence of mets), and hypertension. was recently hospitalized for recent productive cough, shortness of breath, wheezing and chest pain. A CT showed some new peripheral lung nodules. One of 3 AFB specimens was positive at that time. ID was consulted and recommended DC antibiotics. He had nodule biopsy done 4/11. No organisms identified. Cytology was pending. He was then admitted again to [facility] on 4/20 for worsening cough. He had another culture come back AFB positive, but mTB PCR was negative.  ID was again consulted and have been directing ABX.  Also developed small pleural effusion which was sampled. Found to be an exudate without lymph predominance making it unlikely to be related to NTM infection. He also had an echo done 4/28 which demonstrated severe systolic CHF, which was a new finding at the time (LVEF 25-30%). Around that same time he became lethargic, which persisted for several hours. He underwent contrast CT of the head, which identified a 10 mm acute IPH centered at the right thalamus without significant edema. He was seen by neurology who recommended transfer to ICU for tight BP control.   He was found stable and did not require any intervention for  blood pressure control was transferred to ICU 120    Principal Problem:   Lobar pneumonia ([place]) Active Problems:   G E R D   Lung nodules   Hyponatremia   Mycobacterial disease   Pleural effusion   Altered mental status   Protein-calorie malnutrition, severe   Intracerebral hemorrhage   Pleural effusion on right   Other ascites   Benign essential HTN   Tachypnea   Tachycardia   Coronary artery disease involving native coronary artery of native heart without angina pectoris   Leukocytosis   Acute blood loss anemia   AKI (acute kidney injury) ([place])   Assessment & Plan:   Acute encephalopathy out of proportion to [place] -Multifactorial, infection, stroke, dementia -Monitoring, improved -Continue Supportive care   Lobar pneumonia/Non- TB Mycobacterium infection Cont abx per ID  Follow pleural fluid cultures - pending O2 as needed  F/u CXR  as needed    Right thalamic IPH 13mm. -Neuro  Following  -we will cont BP control    Chronic systolic CHF (LVEF 83-15%). Diastolic dysfunction grade 1 as well on echo Hypertension Cardiology following  ON IV hydralazine, metoprolol PRN  Resuming coreg, losartan  POs Goal SBP <140   Asthma/COPD without acute exacerbation -stable  - Scheduled, PRN nebs  Abdominal Pain - US abdomen - neg acute   Transaminitis - Improving, following  LFT  Prostate Cancer Dx one year ago. Has deferred treatment. PET in 08/2017 shows no mets.  -Outpatient follow  up at the patient's discretion.    DVT prophylaxis:   SCDs/compression stockings (avoiding AC due to [place])     Code Status:         Full code   Family Communication: No family at bedside  Disposition Plan:  > 3 days            SNF [place]  Consultants:   Card.  ID  PCCM  Antimicrobials:  Anti-infectives (From admission, onward)   Start     Dose/Rate Route Frequency Ordered Stop   01/08/18 1130  linezolid (ZYVOX) IVPB 600 mg     600 mg 300 mL/hr over 60  Minutes Intravenous Every 12 hours 01/08/18 1055     01/08/18 0800  cefOXitin (MEFOXIN) 12 g in sodium chloride 0.9 % 190 mL continuous infusion     12 g 12.5 mL/hr over 20 Hours Intravenous Every 24 hours 01/07/18 1520     01/07/18 1700  cefOXitin (MEFOXIN) 8 g in sodium chloride 0.9 % 210 mL continuous infusion     8 g 22.7 mL/hr over 11 Hours Intravenous  Once 01/07/18 1644 01/08/18 0402   01/07/18 0800  cefOXitin (MEFOXIN) 8 g in sodium chloride 0.9 % 210 mL continuous infusion  Status:  Discontinued     8 g 12.5 mL/hr over 20 Hours Intravenous Every 24 hours 01/06/18 1125 01/07/18 1520   01/03/18 1800  cefOXitin (MEFOXIN) 3 g in dextrose 5 % 50 mL IVPB     3 g 100 mL/hr over 30 Minutes Intravenous Every 6 hours 01/03/18 1423 01/07/18 0200   12/30/17 0600  tigecycline (TYGACIL) 50 mg in sodium chloride 0.9 % 100 mL IVPB  Status:  Discontinued     50 mg 200 mL/hr over 30 Minutes Intravenous Every 12 hours 12/29/17 1747 01/08/18 1055   12/30/17 0200  tigecycline (TYGACIL) 50 mg in sodium chloride 0.9 % 100 mL IVPB  Status:  Discontinued     50 mg 200 mL/hr over 30 Minutes Intravenous Every 12 hours 12/29/17 1345 12/29/17 1747   12/29/17 1430  cefOXitin (MEFOXIN) 2 g in sodium chloride 0.9 % 100 mL IVPB  Status:  Discontinued     2 g 200 mL/hr over 30 Minutes Intravenous Every 6 hours 12/29/17 1345 01/03/18 1423   12/29/17 1400  azithromycin (ZITHROMAX) 500 mg in sodium chloride 0.9 % 250 mL IVPB     500 mg 250 mL/hr over 60 Minutes Intravenous Every 24 hours 12/29/17 1345     12/29/17 1345  tigecycline (TYGACIL) 100 mg in sodium chloride 0.9 % 100 mL IVPB    Note to Pharmacy:  Please schedule zofran 8mg  iv as anti-emetic prior to infusion   100 mg 200 mL/hr over 30 Minutes Intravenous  Once 12/29/17 1345 12/29/17 2138   12/29/17 0600  vancomycin (VANCOCIN) 500 mg in sodium chloride 0.9 % 100 mL IVPB  Status:  Discontinued     500 mg 100 mL/hr over 60 Minutes Intravenous Every 12  hours 12/28/17 1617 12/29/17 0858   12/28/17 2200  ceFEPIme (MAXIPIME) 1 g in sodium chloride 0.9 % 100 mL IVPB  Status:  Discontinued     1 g 200 mL/hr over 30 Minutes Intravenous Every 8 hours 12/28/17 2123 12/29/17 1342   12/28/17 1645  ceFEPIme (MAXIPIME) 2 g in sodium chloride 0.9 % 100 mL IVPB     2 g 200 mL/hr over 30 Minutes Intravenous  Once 12/28/17 1645 12/28/17 1757   12/28/17 1630  ceFEPIme (MAXIPIME) 1  g in sodium chloride 0.9 % 100 mL IVPB  Status:  Discontinued     1 g 200 mL/hr over 30 Minutes Intravenous  Once 12/28/17 1612 12/28/17 1645   12/28/17 1630  vancomycin (VANCOCIN) IVPB 1000 mg/200 mL premix     1,000 mg 200 mL/hr over 60 Minutes Intravenous  Once 12/28/17 1617 12/28/17 1910      Objective: Vitals:   01/08/18 2324 01/09/18 0000 01/09/18 0400 01/09/18 0810  BP:  122/84 (!) 153/94   Pulse:  70 (!) 59   Resp:  15 16   Temp: 98 F (36.7 C)  97.6 F (36.4 C)   TempSrc: Oral  Oral   SpO2:  98% 97% 99%  Weight:      Height:        Intake/Output Summary (Last 24 hours) at 01/09/2018 1128 Last data filed at 01/08/2018 2247 Gross per 24 hour  Intake 603 ml  Output 1250 ml  Net -647 ml   Filed Weights   01/03/18 0502 01/05/18 0557 01/06/18 0555  Weight: 50.3 kg (110 lb 14.4 oz) 54.9 kg (121 lb 0.5 oz) 50.6 kg (111 lb 9.6 oz)    Examination:  General exam: confused,  Psychiatry: confused, agitated, HEENT: WNLs Respiratory system: Clear to auscultation. Respiratory effort normal. Cardiovascular system: S1 & S2 heard, RRR. No JVD, murmurs, rubs, gallops or clicks. No pedal edema. Gastrointestinal system: Abd. nondistended, soft and nontender. No organomegaly or masses felt. Normal bowel sounds heard. Central nervous system: Alert and oriented. No focal neurological deficits. Extremities: Symmetric 5 x 5 power. Skin: No rashes, lesions or ulcers Wounds:   Data Reviewed: I have personally reviewed following labs and imaging studies  CBC: Recent Labs   Lab 01/04/18 2042 01/05/18 0607 01/06/18 0459 01/07/18 0514  WBC 12.6* 11.5* 22.5* 25.4*  NEUTROABS  --   --   --  23.4*  HGB 11.5* 12.3* 10.8* 11.1*  HCT 34.6* 35.5* 31.3* 32.5*  MCV 78.1 73.8* 73.1* 73.7*  PLT 635* 591* 643* 341*   Basic Metabolic Panel: Recent Labs  Lab 01/04/18 0428 01/04/18 2042 01/05/18 0607 01/06/18 0459 01/07/18 0514 01/09/18 1026  NA 130*  --  132* 132* 132* 135  K 4.7  --  4.9 4.7 4.4 4.5  CL 97*  --  95* 100* 95* 99*  CO2 24  --  26 25 26 29   GLUCOSE 89  --  130* 104* 88 134*  BUN 26*  --  33* 42* 43* 37*  CREATININE 1.07 1.14 1.80* 1.17 1.21 1.31*  CALCIUM 8.2*  --  8.4* 8.2* 8.5* 8.5*  MG  --   --  2.0  --   --   --    GFR: Estimated Creatinine Clearance: 39.2 mL/min (A) (by C-G formula based on SCr of 1.31 mg/dL (H)). Liver Function Tests: Recent Labs  Lab 01/07/18 0514 01/09/18 1026  AST 79* 36  ALT 107* 62  ALKPHOS 120 110  BILITOT 0.7 1.1  PROT 6.2* 6.0*  ALBUMIN 1.4* 1.6*   No results for input(s): LIPASE, AMYLASE in the last 168 hours. Recent Labs  Lab 01/06/18 1405  AMMONIA 30   Recent Labs    01/06/18 1709  TSH 0.383   Anemia Panel: No results for input(s): VITAMINB12, FOLATE, FERRITIN, TIBC, IRON, RETICCTPCT in the last 72 hours. Sepsis Labs: Recent Labs  Lab 01/09/18 1026  LATICACIDVEN 1.3    Recent Results (from the past 240 hour(s))  Acid Fast Smear (AFB)  Status: None   Collection Time: 01/03/18  3:03 PM  Result Value Ref Range Status   AFB Specimen Processing Concentration  Final   Acid Fast Smear Negative  Final    Comment: (NOTE) Performed At: [facility] [place], [place] [id number] Rush Farmer MD [id number]    Source (AFB) PLEURAL  Final    Comment: RIGHT  Culture, body fluid-bottle     Status: None   Collection Time: 01/03/18  3:04 PM  Result Value Ref Range Status   Specimen Description PLEURAL RIGHT  Final   Special Requests NONE  Final   Culture    Final    NO GROWTH 5 DAYS Performed at [facility], [address], [place], [place] [address]    Report Status 01/08/2018 FINAL  Final  Culture, blood (Routine X 2) w Reflex to ID Panel     Status: None (Preliminary result)   Collection Time: 01/06/18  2:00 PM  Result Value Ref Range Status   Specimen Description BLOOD RIGHT HAND  Final   Special Requests   Final    BOTTLES DRAWN AEROBIC AND ANAEROBIC Blood Culture adequate volume   Culture   Final    NO GROWTH 2 DAYS Performed at [facility], [place] [address] [place], [place] [address]    Report Status PENDING  Incomplete  MRSA PCR Screening     Status: None   Collection Time: 01/07/18  2:58 AM  Result Value Ref Range Status   MRSA by PCR NEGATIVE NEGATIVE Final    Comment:        The GeneXpert MRSA Assay (FDA approved for NASAL specimens only), is one component of a comprehensive MRSA colonization surveillance program. It is not intended to diagnose MRSA infection nor to guide or monitor treatment for MRSA infections. Performed at [facility], [place] [address], [place], [place] [address]       Radiology Studies: Mr Virgel Paling JK Contrast  Result Date: 01/07/2018 CLINICAL DATA:  Cerebral hemorrhage suspected EXAM: MRI HEAD WITHOUT CONTRAST MRA HEAD WITHOUT CONTRAST TECHNIQUE: Multiplanar, multiecho pulse sequences of the brain and surrounding structures were obtained without intravenous contrast. Angiographic images of the head were obtained using MRA technique without contrast. COMPARISON:  Head CT from yesterday FINDINGS: MRI HEAD FINDINGS Brain: Multiple small acute infarcts that are rounded and linear, seen about the lateral ventricles, juxta cortical white matter, and deep gray nuclei-especially the thalami. These are consistent with acute infarcts, possibly embolic. No hemorrhage. Ventriculomegaly with asymmetric lateral ventricular size, larger on the left, likely developmental and stable since at least  2014. FLAIR hyperintensity around the lateral ventricles is also stable from 2014 and attributed to chronic small vessel ischemia. Known acute infarct in the posterior right thalamus, likely hemorrhagic conversion. No masslike enhancement or abnormal regional vessels. No detected progression since prior. Vascular: Normal flow voids and vascular enhancements. 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 Atheromatous irregularity of the left V4 and mid basilar. 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 No flow limiting stenosis or branch occlusions seen in the anterior circulation. Negative for beading or aneurysm. 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 There are large bilateral posterior communicating arteries. IMPRESSION: 1. Scattered small acute infarcts in the bilateral cerebral white matter and  thalami primarily. The small right thalamic hematoma is likely hemorrhagic conversion. 2. No emergent finding on intracranial MRA. 3. Atheromatous irregularity of the left V4 segment and basilar with high-grade mid basilar narrowing 4. Chronic ventriculomegaly and asymmetric left lateral ventricle dilatation. Chronic small vessel ischemia. Electronically Signed   By: Monte Fantasia M.D.   On: 01/07/2018 13:27   Mr Jeri Cos HD Contrast  Result Date: 01/07/2018 CLINICAL DATA:  Cerebral hemorrhage suspected EXAM: MRI HEAD WITHOUT CONTRAST MRA HEAD WITHOUT CONTRAST TECHNIQUE: Multiplanar, multiecho pulse sequences of the brain and surrounding structures were obtained without intravenous contrast. Angiographic images of the head were obtained using MRA technique without contrast. COMPARISON:  Head CT from yesterday FINDINGS: MRI HEAD FINDINGS Brain: Multiple small acute  infarcts that are rounded and linear, seen about the lateral ventricles, juxta cortical white matter, and deep gray nuclei-especially the thalami. These are consistent with acute infarcts, possibly embolic. No hemorrhage. Ventriculomegaly with asymmetric lateral ventricular size, larger on the left, likely developmental and stable since at least 2014. FLAIR hyperintensity around the lateral ventricles is also stable from 2014 and attributed to chronic small vessel ischemia. Known acute infarct in the posterior right thalamus, likely hemorrhagic conversion. No masslike enhancement or abnormal regional vessels. No detected progression since prior. Vascular: Normal flow voids and vascular enhancements. 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 Atheromatous irregularity of the left V4 and mid basilar. 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 No flow limiting stenosis or branch occlusions seen in the anterior circulation. Negative for beading or aneurysm. 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 There are large bilateral posterior communicating arteries. IMPRESSION: 1. Scattered small acute infarcts in the bilateral cerebral white matter and thalami primarily. The small right thalamic hematoma is likely hemorrhagic conversion. 2. No emergent finding on intracranial MRA. 3. Atheromatous irregularity of the left V4 segment and basilar with high-grade mid basilar narrowing 4. Chronic ventriculomegaly and asymmetric left lateral ventricle dilatation. Chronic small vessel ischemia. Electronically Signed   By: Monte Fantasia M.D.   On: 01/07/2018 13:27   Dg Chest Port 1 View  Result Date: 01/09/2018 CLINICAL DATA:  Cough EXAM: PORTABLE CHEST 1 VIEW COMPARISON:  January 04, 2018 FINDINGS: There is somewhat less consolidation in the right upper lobe compared to recent study. There is a small right pleural effusion with patchy consolidation in the lateral right base. There is also new subtle consolidation in the medial right base. There is mild atelectatic change in the left mid lung, stable. Heart size and pulmonary vascularity are normal. No adenopathy. No bone lesions. IMPRESSION: 1. New areas of patchy opacity right base. Persistent small right pleural effusion. 2. Less consolidation right upper lobe compared to most recent study. 3.  Stable left midlung atelectasis. 4.  Stable cardiac silhouette. Electronically Signed   By: Lowella Grip III M.D.   On: 01/09/2018 10:24   [place] Ekg Site Rite  Result Date: 01/07/2018 If Site Rite image not attached, placement could not be confirmed due to current cardiac rhythm.   Scheduled Meds: . benzonatate  200 mg Oral TID  . carvedilol  12.5 mg Oral BID WC  . chlorpheniramine-HYDROcodone  5 mL Oral Q12H  . feeding supplement (ENSURE ENLIVE)  237 mL Oral TID BM  . furosemide  40 mg Intravenous  Daily  . guaiFENesin  600 mg Oral BID  . haloperidol lactate  2 mg Intravenous Once  . ipratropium-albuterol  3 mL Nebulization BID  . losartan  50 mg Oral Daily  . mouth rinse  15 mL Mouth Rinse BID  . polyethylene glycol  17 g Oral Daily  . sodium chloride flush  3 mL Intravenous Q12H   Continuous Infusions: . azithromycin Stopped (01/08/18 1645)  . cefOXitin (MEFOXIN) continuous infusion 12 g (01/09/18 0944)  . linezolid (ZYVOX) IV 600 mg (01/09/18 2706)    Time spent: >45 minutes  Deatra James, MD [facility],  Pager [phone]  If 7PM-7AM, please contact night-coverage [email or web address]   Password [facility]  01/09/2018, 11:28 AM

## 2018-01-09 NOTE — Progress Notes (Addendum)
Stroke Team Progress Note     SUBJECTIVE During a.m. rounds I notice that the patient's neurological status had declined with lethargy and increased right-sided weakness. Stat CT scan of the head was obtained which showed new right thalamic hemorrhage with intraventricular extension and early hydrocephalus. Patient was transferred from stepdown to neuro ICU. I spoke to the patient's daughter over the phone and her stepdaughter at the bedside as well as with Dr.agarwala and pulmonary critical care medicine and discussed plan of care.family needs more time to decide on CODE STATUS and intubation OBJECTIVE Most recent Vital Signs: Temp: 97.7 F (36.5 C) (05/02 1200) Temp Source: Axillary (05/02 1200) BP: 120/81 (05/02 1600) Pulse Rate: 64 (05/02 1600) Respiratory Rate: 11 O2 Saturdation: 97%  CBG (last 3)  No results for input(s): GLUCAP in the last 72 hours.  Diet:  Diet Order           Diet NPO time specified  Diet effective now           Activity: bedrest VTE Prophylaxis:  SCDs  Studies: Results for orders placed or performed during the hospital encounter of 12/28/17 (from the past 24 hour(s))  Lactic acid, plasma     Status: None   Collection Time: 01/09/18 10:26 AM  Result Value Ref Range   Lactic Acid, Venous 1.3 0.5 - 1.9 mmol/L  Comprehensive metabolic panel     Status: Abnormal   Collection Time: 01/09/18 10:26 AM  Result Value Ref Range   Sodium 135 135 - 145 mmol/L   Potassium 4.5 3.5 - 5.1 mmol/L   Chloride 99 (L) 101 - 111 mmol/L   CO2 29 22 - 32 mmol/L   Glucose, Bld 134 (H) 65 - 99 mg/dL   BUN 37 (H) 6 - 20 mg/dL   Creatinine, Ser 1.31 (H) 0.61 - 1.24 mg/dL   Calcium 8.5 (L) 8.9 - 10.3 mg/dL   Total Protein 6.0 (L) 6.5 - 8.1 g/dL   Albumin 1.6 (L) 3.5 - 5.0 g/dL   AST 36 15 - 41 U/L   ALT 62 17 - 63 U/L   Alkaline Phosphatase 110 38 - 126 U/L   Total Bilirubin 1.1 0.3 - 1.2 mg/dL   GFR calc non Af Amer 55 (L) >60 mL/min   GFR calc Af Amer >60 >60  mL/min   Anion gap 7 5 - 15  Urinalysis, Routine w reflex microscopic     Status: Abnormal   Collection Time: 01/09/18 11:15 AM  Result Value Ref Range   Color, Urine STRAW (A) YELLOW   APPearance CLEAR CLEAR   Specific Gravity, Urine 1.008 1.005 - 1.030   pH 7.0 5.0 - 8.0   Glucose, UA NEGATIVE NEGATIVE mg/dL   Hgb urine dipstick MODERATE (A) NEGATIVE   Bilirubin Urine NEGATIVE NEGATIVE   Ketones, ur NEGATIVE NEGATIVE mg/dL   Protein, ur NEGATIVE NEGATIVE mg/dL   Nitrite NEGATIVE NEGATIVE   Leukocytes, UA NEGATIVE NEGATIVE   RBC / HPF 6-10 0 - 5 RBC/hpf   WBC, UA 0-5 0 - 5 WBC/hpf   Bacteria, UA NONE SEEN NONE SEEN  Lactic acid, plasma     Status: None   Collection Time: 01/09/18  1:07 PM  Result Value Ref Range   Lactic Acid, Venous 1.4 0.5 - 1.9 mmol/L     Ct Head Wo Contrast  Addendum Date: 01/09/2018   ADDENDUM REPORT: 01/09/2018 11:41 ADDENDUM: Critical Value/emergent results were called by telephone at the time of interpretation on 01/09/2018 at 1133  hours to Dr. Antony Contras , who verbally acknowledged these results. We discussed the consideration of septic emboli given this constellation of findings, although so far it seems to be only the one thalamic lacune which has hemorrhaged. Electronically Signed   By: Genevie Ann M.D.   On: 01/09/2018 11:41   Result Date: 01/09/2018 CLINICAL DATA:  67 year old male with small acute intra-axial hemorrhage in the right thalamus detected on noncontrast CT, and multiple scattered acute infarcts in both hemispheres on MRI. Increased confusion today. EXAM: CT HEAD WITHOUT CONTRAST TECHNIQUE: Contiguous axial images were obtained from the base of the skull through the vertex without intravenous contrast. COMPARISON:  Brain MRI and intracranial MRA 01/07/2018. Head CT 01/06/2018, and earlier. FINDINGS: Brain: Substantial progression of the right thalamic hemorrhage since 01/07/2018. New moderate to large intraventricular extension of blood. Estimated  intra-axial blood volume is now 20 milliliters (versus approximately 1 milliliter on the initial head CT), now encompassing 38 x 30 x 35 millimeters (AP by transverse by CC). Confluent surrounding edema. New mild ventriculomegaly including evidence of transependymal edema along the left temporal horn. New leftward midline shift of 9 millimeters. Basilar cisterns remain patent. The superimposed small scattered bilateral mostly white matter infarcts appear stable to the diffusion imaging on 01/07/2018. No superimposed cortical cytotoxic edema is identified. Vascular: Calcified atherosclerosis at the skull base. Skull: No acute osseous abnormality identified. Sinuses/Orbits: Stable and clear aside from unchanged posterior right ethmoid opacification. Other: Mildly Disconjugate gaze. No other acute orbit or scalp soft tissue finding. IMPRESSION: 1. Substantial progression of the right thalamic hemorrhage since 01/06/2018 with intra-axial blood volume now estimated at 20 mL. Confluent surrounding edema and new leftward midline shift of 9 mm. 2. New moderate to large intraventricular extension of hemorrhage with acute ventriculomegaly and mild transependymal edema. 3. Basilar cisterns remain normal. 4. Scattered small infarcts demonstrated by MRI appears stable by CT. No new infarct identified. Electronically Signed: By: Genevie Ann M.D. On: 01/09/2018 11:31   Dg Chest Port 1 View  Result Date: 01/09/2018 CLINICAL DATA:  Cough EXAM: PORTABLE CHEST 1 VIEW COMPARISON:  January 04, 2018 FINDINGS: There is somewhat less consolidation in the right upper lobe compared to recent study. There is a small right pleural effusion with patchy consolidation in the lateral right base. There is also new subtle consolidation in the medial right base. There is mild atelectatic change in the left mid lung, stable. Heart size and pulmonary vascularity are normal. No adenopathy. No bone lesions. IMPRESSION: 1. New areas of patchy opacity right  base. Persistent small right pleural effusion. 2. Less consolidation right upper lobe compared to most recent study. 3.  Stable left midlung atelectasis. 4.  Stable cardiac silhouette. Electronically Signed   By: Lowella Grip III M.D.   On: 01/09/2018 10:24    Physical Exam:    frail middle-aged African-American male  . Marland Kitchen Afebrile. Head is nontraumatic. Neck is supple without bruit.    Cardiac exam no murmur or gallop. Lungs are clear to auscultation. Distal pulses are well felt. Neurological Exam :   obtunded and unresponsive. Caryl Pina opens eyes to sternal rub. Right gaze preference. We'll mobilize to midline with reflex eye movements. Does not blink to threat on either side. Does not speak or follow any commands. Dense right hemiplegia with hypotonia. Partial withdrawal in the left upper and lower extremity to painful stimuli. Right plantar upgoing left downgoing. Gait not tested  Deep tendon reflexes are diminished on the right normal on the  left.  ASSESSMENT Mr. [name] is a 67 y.o. 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 Etiology of the strokes possibly  cardioembolic asechocardiogram   shows diminished ejection fraction of 20-25% and septic embolism from pneumonia with atypical mycobacterium would be unlikely. Hospital day # 12  TREATMENT/PLAN Patient was given one dose of mannitol. EEG was also ordered. The patient`s  neurological status has unfortunately taken  A turn for the worse with significant neurological  worsening  From thalamic hemorrhage with intraventricular extension and hydrocephalus. His prognosis is quite poor. Will likely need long-term ventilatory support and survival with difficulty and need nursing home placement with tracheostomy and PEG tube. The patient's daughter is quite distraught at the  moment and needs more time to digest and make decisions about goals of care. I had a long discussion with the patient's daughter, stepdaughter and niece at the bedside and answered questions.Marland Kitchen  Marland KitchenThis patient is critically ill and at significant risk of neurological worsening, death and care requires constant monitoring of vital signs, hemodynamics,respiratory and cardiac monitoring, extensive review of multiple databases, frequent neurological assessment, discussion with family, other specialists and medical decision making of high complexity.I have made any additions or clarifications directly to the above note.This critical care time does not reflect procedure time, or teaching time or supervisory time of PA/NP/Med Resident etc but could involve care discussion time.  I spent 60 minutes of neurocritical care time  in the care of  this patient.      Antony Contras, MD [facility] Pager: [phone] 01/09/2018 4:41 PM

## 2018-01-09 NOTE — Progress Notes (Signed)
Progress Note  Patient Name: [name] Date of Encounter: 01/09/2018  Primary Cardiologist:   No primary care provider on file.   Subjective   Worsening mental status and noted to have progression of right thalamic hemorrhage since previous MRI.  Patient is minimally responsive.    Inpatient Medications    Scheduled Meds: . benzonatate  200 mg Oral TID  . carvedilol  12.5 mg Oral BID WC  . chlorpheniramine-HYDROcodone  5 mL Oral Q12H  . feeding supplement (ENSURE ENLIVE)  237 mL Oral TID BM  . furosemide  40 mg Intravenous Daily  . guaiFENesin  600 mg Oral BID  . haloperidol lactate  2 mg Intravenous Once  . ipratropium-albuterol  3 mL Nebulization BID  . losartan  50 mg Oral Daily  . mannitol      . mouth rinse  15 mL Mouth Rinse BID  . polyethylene glycol  17 g Oral Daily  . sodium chloride flush  3 mL Intravenous Q12H   Continuous Infusions: . azithromycin 500 mg (01/09/18 1344)  . cefOXitin (MEFOXIN) continuous infusion 12 g (01/09/18 0944)  . linezolid (ZYVOX) IV Stopped (01/09/18 1043)   PRN Meds: acetaminophen **OR** acetaminophen, alum & mag hydroxide-simeth, benzonatate, hydrALAZINE, ipratropium-albuterol, lidocaine (PF), metoprolol tartrate, ondansetron **OR** ondansetron (ZOFRAN) IV   Vital Signs    Vitals:   01/09/18 1215 01/09/18 1230 01/09/18 1245 01/09/18 1300  BP: (!) 168/88 (!) 157/98 (!) 175/113 (!) 153/93  Pulse: 61 62 (!) 102 66  Resp: 15 17 (!) 22 14  Temp:      TempSrc:      SpO2: 100% 99% 98% 99%  Weight:      Height:        Intake/Output Summary (Last 24 hours) at 01/09/2018 1409 Last data filed at 01/09/2018 1300 Gross per 24 hour  Intake 874 ml  Output 1050 ml  Net -176 ml   Filed Weights   01/03/18 0502 01/05/18 0557 01/06/18 0555  Weight: 110 lb 14.4 oz (50.3 kg) 121 lb 0.5 oz (54.9 kg) 111 lb 9.6 oz (50.6 kg)    Telemetry    NSR  - Personally Reviewed  ECG    NA - Personally Reviewed  Physical Exam   GEN:   Opens  eyes, no acute distress Neck: No  JVD Cardiac: RRR, no murmurs, rubs, or gallops.  Respiratory: Clear   to auscultation bilaterally. GI: Soft, nontender, non-distended, normal bowel sounds  MS:  No edema; No deformity. Neuro:   Minimally responsive in the bed   Labs    Chemistry Recent Labs  Lab 01/06/18 0459 01/07/18 0514 01/09/18 1026  NA 132* 132* 135  K 4.7 4.4 4.5  CL 100* 95* 99*  CO2 25 26 29   GLUCOSE 104* 88 134*  BUN 42* 43* 37*  CREATININE 1.17 1.21 1.31*  CALCIUM 8.2* 8.5* 8.5*  PROT  --  6.2* 6.0*  ALBUMIN  --  1.4* 1.6*  AST  --  79* 36  ALT  --  107* 62  ALKPHOS  --  120 110  BILITOT  --  0.7 1.1  GFRNONAA >60 >60 55*  GFRAA >60 >60 >60  ANIONGAP 7 11 7      Hematology Recent Labs  Lab 01/05/18 0607 01/06/18 0459 01/07/18 0514  WBC 11.5* 22.5* 25.4*  RBC 4.81 4.28 4.41  HGB 12.3* 10.8* 11.1*  HCT 35.5* 31.3* 32.5*  MCV 73.8* 73.1* 73.7*  MCH 25.6* 25.2* 25.2*  MCHC 34.6 34.5 34.2  RDW 13.0 13.2 13.1  PLT 591* 643* 627*    Cardiac EnzymesNo results for input(s): TROPONINI in the last 168 hours. No results for input(s): TROPIPOC in the last 168 hours.   BNPNo results for input(s): BNP, PROBNP in the last 168 hours.   DDimer No results for input(s): DDIMER in the last 168 hours.   Radiology    Ct Head Wo Contrast  Addendum Date: 01/09/2018   ADDENDUM REPORT: 01/09/2018 11:41 ADDENDUM: Critical Value/emergent results were called by telephone at the time of interpretation on 01/09/2018 at 1133 hours to Dr. Antony Contras , who verbally acknowledged these results. We discussed the consideration of septic emboli given this constellation of findings, although so far it seems to be only the one thalamic lacune which has hemorrhaged. Electronically Signed   By: Genevie Ann M.D.   On: 01/09/2018 11:41   Result Date: 01/09/2018 CLINICAL DATA:  67 year old male with small acute intra-axial hemorrhage in the right thalamus detected on noncontrast CT, and multiple  scattered acute infarcts in both hemispheres on MRI. Increased confusion today. EXAM: CT HEAD WITHOUT CONTRAST TECHNIQUE: Contiguous axial images were obtained from the base of the skull through the vertex without intravenous contrast. COMPARISON:  Brain MRI and intracranial MRA 01/07/2018. Head CT 01/06/2018, and earlier. FINDINGS: Brain: Substantial progression of the right thalamic hemorrhage since 01/07/2018. New moderate to large intraventricular extension of blood. Estimated intra-axial blood volume is now 20 milliliters (versus approximately 1 milliliter on the initial head CT), now encompassing 38 x 30 x 35 millimeters (AP by transverse by CC). Confluent surrounding edema. New mild ventriculomegaly including evidence of transependymal edema along the left temporal horn. New leftward midline shift of 9 millimeters. Basilar cisterns remain patent. The superimposed small scattered bilateral mostly white matter infarcts appear stable to the diffusion imaging on 01/07/2018. No superimposed cortical cytotoxic edema is identified. Vascular: Calcified atherosclerosis at the skull base. Skull: No acute osseous abnormality identified. Sinuses/Orbits: Stable and clear aside from unchanged posterior right ethmoid opacification. Other: Mildly Disconjugate gaze. No other acute orbit or scalp soft tissue finding. IMPRESSION: 1. Substantial progression of the right thalamic hemorrhage since 01/06/2018 with intra-axial blood volume now estimated at 20 mL. Confluent surrounding edema and new leftward midline shift of 9 mm. 2. New moderate to large intraventricular extension of hemorrhage with acute ventriculomegaly and mild transependymal edema. 3. Basilar cisterns remain normal. 4. Scattered small infarcts demonstrated by MRI appears stable by CT. No new infarct identified. Electronically Signed: By: Genevie Ann M.D. On: 01/09/2018 11:31   Dg Chest Port 1 View  Result Date: 01/09/2018 CLINICAL DATA:  Cough EXAM: PORTABLE  CHEST 1 VIEW COMPARISON:  January 04, 2018 FINDINGS: There is somewhat less consolidation in the right upper lobe compared to recent study. There is a small right pleural effusion with patchy consolidation in the lateral right base. There is also new subtle consolidation in the medial right base. There is mild atelectatic change in the left mid lung, stable. Heart size and pulmonary vascularity are normal. No adenopathy. No bone lesions. IMPRESSION: 1. New areas of patchy opacity right base. Persistent small right pleural effusion. 2. Less consolidation right upper lobe compared to most recent study. 3.  Stable left midlung atelectasis. 4.  Stable cardiac silhouette. Electronically Signed   By: Lowella Grip III M.D.   On: 01/09/2018 10:24   [place] Ekg Site Rite  Result Date: 01/07/2018 If Site Rite image not attached, placement could not be confirmed due to current  cardiac rhythm.   Cardiac Studies   ECHO:  01/07/18  Study Conclusions  - Left ventricle: The cavity size was normal. Systolic function was   severely reduced. The estimated ejection fraction was in the   range of 25% to 30%. Diffuse hypokinesis. Doppler parameters are   consistent with abnormal left ventricular relaxation (grade 1   diastolic dysfunction). Doppler parameters are consistent with   elevated ventricular end-diastolic filling pressure. - Mitral valve: There was moderate regurgitation directed centrally   and posteriorly. - Right ventricle: The cavity size was normal. Wall thickness was   normal. Systolic function was normal. - Tricuspid valve: There was trivial regurgitation. - Pulmonary arteries: Systolic pressure was within the normal   range. - Inferior vena cava: The vessel was not visualized. - Pericardium, extracardiac: There was no pericardial effusion.   Patient Profile     67 y.o. male with a hx of prostate cancer, hypertension, GERD, COPD, CAD with mild nonobstructive disease by cath 2004 who is  being seen for the evaluation of CHF and new diagnosis of cardiomyopathy at the request of Dr. Tawanna Solo.   Assessment & Plan    CVA:    Neuro is worried about possible cardiac source as there is evidence of bilateral embolic infarcts on MRI.  Now with acute hemorrhagic conversion.  Initial source of emboli is not clear.  However, considering comfort care.  Await further neurology input.  No further testing suggested at this point.    HTN:  Goal is SBP less than 140.   Cozaar and beta blocker doses increased yesterday.   Creat is up slightly.  NPO.  Will manage BP with IV therapy as needed.       CARDIOMYOPATHY:  New diagnosis without clear etiology but I suspect non ischemic.  I/O incomplete.       For questions or updates, please contact [place] Please consult [email or web address] for contact info under Cardiology/STEMI.   Signed, Minus Breeding, MD  01/09/2018, 2:09 PM

## 2018-01-09 NOTE — Procedures (Signed)
History: 67 year old male with intra-cranial hemorrhage and encephalopathy  Sedation: None  Technique: This is a 21 channel routine scalp EEG performed at the bedside with bipolar and monopolar montages arranged in accordance to the international 10/20 system of electrode placement. One channel was dedicated to EKG recording.    Background: The study is limited by muscle artifact continuous throughout the study except for brief quiescent periods.  In the posterior leads on transverse montage there is as much muscle artifact and there is no evidence of ongoing seizure activity on either side.  There is also no evidence of discharges during the quiescent periods.  There is a PDR visible of 8 Hz.  There is also some some irregular slow activity in the visible leads  Photic stimulation: Physiologic driving is not performed  EEG Abnormalities: 1) presumably generalized irregular slow activity in a limited EEG  Clinical Interpretation: This EEG was somewhat limited due to muscle artifact, however there was no evidence of ongoing seizure or epileptic tendency seen on the study.  Please note that a normal EEG does not preclude the possibility of epilepsy.   Roland Rack, MD [facility] [phone]  If 7pm- 7am, please page neurology on call as listed in [place].

## 2018-01-09 NOTE — Progress Notes (Signed)
At bedside to place PICC and patient is agitated and kicking legs off the bed. Due to the patients inability to remain still at this time we will attempt again at another time. Primary RN aware and will reach out to MD.

## 2018-01-09 NOTE — Social Work (Signed)
CSW to follow, FL2 to be completed, but not faxed out until determined SNF is more appropriate plan.   Alexander Mt, [place] Work [phone]

## 2018-01-09 NOTE — Progress Notes (Signed)
INFECTIOUS DISEASE PROGRESS NOTE  ID: [name] is a 67 y.o. male with  Principal Problem:   Lobar pneumonia ([place]) Active Problems:   G E R D   Lung nodules   Hyponatremia   Mycobacterial disease   Pleural effusion   Altered mental status   Protein-calorie malnutrition, severe   Intracerebral hemorrhage   Pleural effusion on right   Other ascites   Benign essential HTN   Tachypnea   Tachycardia   Coronary artery disease involving native coronary artery of native heart without angina pectoris   Leukocytosis   Acute blood loss anemia   AKI (acute kidney injury) ([place])  Subjective: Further CVAs  Abtx:  Anti-infectives (From admission, onward)   Start     Dose/Rate Route Frequency Ordered Stop   01/08/18 1130  linezolid (ZYVOX) IVPB 600 mg     600 mg 300 mL/hr over 60 Minutes Intravenous Every 12 hours 01/08/18 1055     01/08/18 0800  cefOXitin (MEFOXIN) 12 g in sodium chloride 0.9 % 190 mL continuous infusion     12 g 12.5 mL/hr over 20 Hours Intravenous Every 24 hours 01/07/18 1520     01/07/18 1700  cefOXitin (MEFOXIN) 8 g in sodium chloride 0.9 % 210 mL continuous infusion     8 g 22.7 mL/hr over 11 Hours Intravenous  Once 01/07/18 1644 01/08/18 0402   01/07/18 0800  cefOXitin (MEFOXIN) 8 g in sodium chloride 0.9 % 210 mL continuous infusion  Status:  Discontinued     8 g 12.5 mL/hr over 20 Hours Intravenous Every 24 hours 01/06/18 1125 01/07/18 1520   01/03/18 1800  cefOXitin (MEFOXIN) 3 g in dextrose 5 % 50 mL IVPB     3 g 100 mL/hr over 30 Minutes Intravenous Every 6 hours 01/03/18 1423 01/07/18 0200   12/30/17 0600  tigecycline (TYGACIL) 50 mg in sodium chloride 0.9 % 100 mL IVPB  Status:  Discontinued     50 mg 200 mL/hr over 30 Minutes Intravenous Every 12 hours 12/29/17 1747 01/08/18 1055   12/30/17 0200  tigecycline (TYGACIL) 50 mg in sodium chloride 0.9 % 100 mL IVPB  Status:  Discontinued     50 mg 200 mL/hr over 30 Minutes Intravenous Every 12 hours  12/29/17 1345 12/29/17 1747   12/29/17 1430  cefOXitin (MEFOXIN) 2 g in sodium chloride 0.9 % 100 mL IVPB  Status:  Discontinued     2 g 200 mL/hr over 30 Minutes Intravenous Every 6 hours 12/29/17 1345 01/03/18 1423   12/29/17 1400  azithromycin (ZITHROMAX) 500 mg in sodium chloride 0.9 % 250 mL IVPB     500 mg 250 mL/hr over 60 Minutes Intravenous Every 24 hours 12/29/17 1345     12/29/17 1345  tigecycline (TYGACIL) 100 mg in sodium chloride 0.9 % 100 mL IVPB    Note to Pharmacy:  Please schedule zofran 8mg  iv as anti-emetic prior to infusion   100 mg 200 mL/hr over 30 Minutes Intravenous  Once 12/29/17 1345 12/29/17 2138   12/29/17 0600  vancomycin (VANCOCIN) 500 mg in sodium chloride 0.9 % 100 mL IVPB  Status:  Discontinued     500 mg 100 mL/hr over 60 Minutes Intravenous Every 12 hours 12/28/17 1617 12/29/17 0858   12/28/17 2200  ceFEPIme (MAXIPIME) 1 g in sodium chloride 0.9 % 100 mL IVPB  Status:  Discontinued     1 g 200 mL/hr over 30 Minutes Intravenous Every 8 hours 12/28/17 2123 12/29/17 1342  12/28/17 1645  ceFEPIme (MAXIPIME) 2 g in sodium chloride 0.9 % 100 mL IVPB     2 g 200 mL/hr over 30 Minutes Intravenous  Once 12/28/17 1645 12/28/17 1757   12/28/17 1630  ceFEPIme (MAXIPIME) 1 g in sodium chloride 0.9 % 100 mL IVPB  Status:  Discontinued     1 g 200 mL/hr over 30 Minutes Intravenous  Once 12/28/17 1612 12/28/17 1645   12/28/17 1630  vancomycin (VANCOCIN) IVPB 1000 mg/200 mL premix     1,000 mg 200 mL/hr over 60 Minutes Intravenous  Once 12/28/17 1617 12/28/17 1910      Medications:  Scheduled: . benzonatate  200 mg Oral TID  . carvedilol  12.5 mg Oral BID WC  . chlorpheniramine-HYDROcodone  5 mL Oral Q12H  . feeding supplement (ENSURE ENLIVE)  237 mL Oral TID BM  . furosemide  40 mg Intravenous Daily  . guaiFENesin  600 mg Oral BID  . haloperidol lactate  2 mg Intravenous Once  . ipratropium-albuterol  3 mL Nebulization BID  . losartan  50 mg Oral Daily  .  mouth rinse  15 mL Mouth Rinse BID  . polyethylene glycol  17 g Oral Daily  . sodium chloride flush  3 mL Intravenous Q12H    Objective: Vital signs in last 24 hours: Temp:  [96.4 F (35.8 C)-98 F (36.7 C)] 97.6 F (36.4 C) (05/02 0400) Pulse Rate:  [59-103] 59 (05/02 0400) Resp:  [15-25] 16 (05/02 0400) BP: (122-161)/(84-118) 153/94 (05/02 0400) SpO2:  [94 %-99 %] 99 % (05/02 0810)   General appearance: no distress Resp: clear to auscultation bilaterally Cardio: regular rate and rhythm GI: normal findings: bowel sounds normal and soft, non-tender Neurologic: Mental status: eyes open, not clear that he tracks. he does not respond to voice or tactile stim. no posturing.   Lab Results Recent Labs    01/07/18 0514 01/09/18 1026  WBC 25.4*  --   HGB 11.1*  --   HCT 32.5*  --   NA 132* 135  K 4.4 4.5  CL 95* 99*  CO2 26 29  BUN 43* 37*  CREATININE 1.21 1.31*   Liver Panel Recent Labs    01/07/18 0514 01/09/18 1026  PROT 6.2* 6.0*  ALBUMIN 1.4* 1.6*  AST 79* 36  ALT 107* 62  ALKPHOS 120 110  BILITOT 0.7 1.1  BILIDIR 0.3  --   IBILI 0.4  --    Sedimentation Rate No results for input(s): ESRSEDRATE in the last 72 hours. C-Reactive Protein No results for input(s): CRP in the last 72 hours.  Microbiology: Recent Results (from the past 240 hour(s))  Acid Fast Smear (AFB)     Status: None   Collection Time: 01/03/18  3:03 PM  Result Value Ref Range Status   AFB Specimen Processing Concentration  Final   Acid Fast Smear Negative  Final    Comment: (NOTE) Performed At: [facility] [place], [place] [id number] Rush Farmer MD SW:[id number]    Source (AFB) PLEURAL  Final    Comment: RIGHT  Culture, body fluid-bottle     Status: None   Collection Time: 01/03/18  3:04 PM  Result Value Ref Range Status   Specimen Description PLEURAL RIGHT  Final   Special Requests NONE  Final   Culture   Final    NO GROWTH 5 DAYS Performed at [facility], [address], [place], [place] [address]    Report Status 01/08/2018  FINAL  Final  Culture, blood (Routine X 2) w Reflex to ID Panel     Status: None (Preliminary result)   Collection Time: 01/06/18  2:00 PM  Result Value Ref Range Status   Specimen Description BLOOD RIGHT HAND  Final   Special Requests   Final    BOTTLES DRAWN AEROBIC AND ANAEROBIC Blood Culture adequate volume   Culture   Final    NO GROWTH 2 DAYS Performed at [facility], [place] [address] [place], [place] [address]    Report Status PENDING  Incomplete  MRSA PCR Screening     Status: None   Collection Time: 01/07/18  2:58 AM  Result Value Ref Range Status   MRSA by PCR NEGATIVE NEGATIVE Final    Comment:        The GeneXpert MRSA Assay (FDA approved for NASAL specimens only), is one component of a comprehensive MRSA colonization surveillance program. It is not intended to diagnose MRSA infection nor to guide or monitor treatment for MRSA infections. Performed at [facility], [place] [address] [place], [place] [address]     Studies/Results: Ct Head Wo Contrast  Addendum Date: 01/09/2018   ADDENDUM REPORT: 01/09/2018 11:41 ADDENDUM: Critical Value/emergent results were called by telephone at the time of interpretation on 01/09/2018 at 1133 hours to Dr. Antony Contras , who verbally acknowledged these results. We discussed the consideration of septic emboli given this constellation of findings, although so far it seems to be only the one thalamic lacune which has hemorrhaged. Electronically Signed   By: Genevie Ann M.D.   On: 01/09/2018 11:41   Result Date: 01/09/2018 CLINICAL DATA:  67 year old male with small acute intra-axial hemorrhage in the right thalamus detected on noncontrast CT, and multiple scattered acute infarcts in both hemispheres on MRI. Increased confusion today. EXAM: CT HEAD WITHOUT CONTRAST TECHNIQUE: Contiguous axial images were obtained from the base of the skull through the  vertex without intravenous contrast. COMPARISON:  Brain MRI and intracranial MRA 01/07/2018. Head CT 01/06/2018, and earlier. FINDINGS: Brain: Substantial progression of the right thalamic hemorrhage since 01/07/2018. New moderate to large intraventricular extension of blood. Estimated intra-axial blood volume is now 20 milliliters (versus approximately 1 milliliter on the initial head CT), now encompassing 38 x 30 x 35 millimeters (AP by transverse by CC). Confluent surrounding edema. New mild ventriculomegaly including evidence of transependymal edema along the left temporal horn. New leftward midline shift of 9 millimeters. Basilar cisterns remain patent. The superimposed small scattered bilateral mostly white matter infarcts appear stable to the diffusion imaging on 01/07/2018. No superimposed cortical cytotoxic edema is identified. Vascular: Calcified atherosclerosis at the skull base. Skull: No acute osseous abnormality identified. Sinuses/Orbits: Stable and clear aside from unchanged posterior right ethmoid opacification. Other: Mildly Disconjugate gaze. No other acute orbit or scalp soft tissue finding. IMPRESSION: 1. Substantial progression of the right thalamic hemorrhage since 01/06/2018 with intra-axial blood volume now estimated at 20 mL. Confluent surrounding edema and new leftward midline shift of 9 mm. 2. New moderate to large intraventricular extension of hemorrhage with acute ventriculomegaly and mild transependymal edema. 3. Basilar cisterns remain normal. 4. Scattered small infarcts demonstrated by MRI appears stable by CT. No new infarct identified. Electronically Signed: By: Genevie Ann M.D. On: 01/09/2018 11:31   Mr Jodene Nam Head Wo Contrast  Result Date: 01/07/2018 CLINICAL DATA:  Cerebral hemorrhage suspected EXAM: MRI HEAD WITHOUT CONTRAST MRA HEAD WITHOUT CONTRAST TECHNIQUE: Multiplanar, multiecho pulse sequences of the brain  and surrounding structures were obtained without intravenous  contrast. Angiographic images of the head were obtained using MRA technique without contrast. COMPARISON:  Head CT from yesterday FINDINGS: MRI HEAD FINDINGS Brain: Multiple small acute infarcts that are rounded and linear, seen about the lateral ventricles, juxta cortical white matter, and deep gray nuclei-especially the thalami. These are consistent with acute infarcts, possibly embolic. No hemorrhage. Ventriculomegaly with asymmetric lateral ventricular size, larger on the left, likely developmental and stable since at least 2014. FLAIR hyperintensity around the lateral ventricles is also stable from 2014 and attributed to chronic small vessel ischemia. Known acute infarct in the posterior right thalamus, likely hemorrhagic conversion. No masslike enhancement or abnormal regional vessels. No detected progression since prior. Vascular: Normal flow voids and vascular enhancements. 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 Atheromatous irregularity of the left V4 and mid basilar. 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 No flow limiting stenosis or branch occlusions seen in the anterior circulation. Negative for beading or aneurysm. 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 There are large bilateral posterior communicating arteries. IMPRESSION: 1. Scattered small acute infarcts in the bilateral cerebral white matter and thalami primarily. The small right thalamic hematoma is likely hemorrhagic conversion. 2. No emergent finding on intracranial MRA. 3. Atheromatous irregularity of the left V4 segment and basilar with high-grade mid basilar narrowing 4. Chronic ventriculomegaly and asymmetric left lateral ventricle dilatation. Chronic small vessel ischemia.  Electronically Signed   By: Monte Fantasia M.D.   On: 01/07/2018 13:27   Mr Jeri Cos QJ Contrast  Result Date: 01/07/2018 CLINICAL DATA:  Cerebral hemorrhage suspected EXAM: MRI HEAD WITHOUT CONTRAST MRA HEAD WITHOUT CONTRAST TECHNIQUE: Multiplanar, multiecho pulse sequences of the brain and surrounding structures were obtained without intravenous contrast. Angiographic images of the head were obtained using MRA technique without contrast. COMPARISON:  Head CT from yesterday FINDINGS: MRI HEAD FINDINGS Brain: Multiple small acute infarcts that are rounded and linear, seen about the lateral ventricles, juxta cortical white matter, and deep gray nuclei-especially the thalami. These are consistent with acute infarcts, possibly embolic. No hemorrhage. Ventriculomegaly with asymmetric lateral ventricular size, larger on the left, likely developmental and stable since at least 2014. FLAIR hyperintensity around the lateral ventricles is also stable from 2014 and attributed to chronic small vessel ischemia. Known acute infarct in the posterior right thalamus, likely hemorrhagic conversion. No masslike enhancement or abnormal regional vessels. No detected progression since prior. Vascular: Normal flow voids and vascular enhancements. 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 Atheromatous irregularity of the left V4 and mid basilar. 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 No flow limiting stenosis or branch occlusions seen in the anterior circulation. Negative for beading or aneurysm. 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 There are large bilateral posterior communicating arteries. IMPRESSION: 1. Scattered small acute infarcts in the bilateral cerebral white matter  and thalami primarily. The small right thalamic hematoma is likely hemorrhagic conversion. 2. No emergent finding on intracranial MRA. 3. Atheromatous irregularity of the left V4 segment and basilar with high-grade mid basilar narrowing 4. Chronic ventriculomegaly and asymmetric left lateral ventricle  dilatation. Chronic small vessel ischemia. Electronically Signed   By: Monte Fantasia M.D.   On: 01/07/2018 13:27   Dg Chest Port 1 View  Result Date: 01/09/2018 CLINICAL DATA:  Cough EXAM: PORTABLE CHEST 1 VIEW COMPARISON:  January 04, 2018 FINDINGS: There is somewhat less consolidation in the right upper lobe compared to recent study. There is a small right pleural effusion with patchy consolidation in the lateral right base. There is also new subtle consolidation in the medial right base. There is mild atelectatic change in the left mid lung, stable. Heart size and pulmonary vascularity are normal. No adenopathy. No bone lesions. IMPRESSION: 1. New areas of patchy opacity right base. Persistent small right pleural effusion. 2. Less consolidation right upper lobe compared to most recent study. 3.  Stable left midlung atelectasis. 4.  Stable cardiac silhouette. Electronically Signed   By: Lowella Grip III M.D.   On: 01/09/2018 10:24   [place] Ekg Site Rite  Result Date: 01/07/2018 If Site Rite image not attached, placement could not be confirmed due to current cardiac rhythm.    Assessment/Plan: Intra-parenchymal hemorrhage (R thalamus) HTN Non-tuberculous mycobacterial infection Lung nodules CHF  Total days of antibiotics:11cefoxitin, tigecycline --> zyvox, azithro  Due to his families concerns about tygacil (after reading drug reactions online) changed to zyvox Watch plt count Concern for septic emboli from his CT. This would be very unusual for mycobacteria (reportable?).  TEE not currently able to be done.  Defer to Neuro on further imaging.  Available as needed.          Bobby Rumpf MD, FACP Infectious Diseases (pager) [phone] [email or web address] 01/09/2018, 11:50 AM  LOS: 12 days

## 2018-01-09 NOTE — Progress Notes (Signed)
This writer was called to patients room by the IV nurse that was trying to place a PICC, however, patient was uncooperative and moving causing the sterile field not to remain sterile. I reviewed MAR to see if patient had PRN's for agiation and there was no medicines ordered. This Nurse, learning disability MD, MD returned call and gave order for Haldol 2mg , IV nurse stated she could not wait due to having to place other PICC's, this writer asked if they could call before they came back so that the Haldol could be given. Upon this wrtiter returning to room from Med Room to obtain medication patient was being evaluated by Speech and he would not open his eyes and would not take what was being offered to drink (Ensure) or eat ( Grits), Speech stated she can not evaluated if patient will not participate. As the Speech Therapies was leaving the MD enter room and stated if I give him Ativan we woukd need to intubate patient, I stated this is not how patient was acting when I paged you , MD called patient name a few times then stated ill come back later to assess him again. Approximately 5-10 minutes later the Neurologist asked what happen he looks different than yesterday and did he have a seizure this Probation officer expalined to Neurologist the same information given to MD. Neurologist stated he was placing an order for STAT CT SCAN. This Probation officer and Rapid Response nurse prepared patient to go to CT SCAN, patient was taken to CT SCAN by rapid response nurse.

## 2018-01-10 NOTE — Progress Notes (Signed)
OT Cancellation and Discharge.  Note  Patient Details Name: [name] MRN: [id number] DOB: 29-May-1951   Cancelled Treatment:    Reason Eval/Treat Not Completed: Medical issues which prohibited therapy.  OT discontinue orders received.  Pt is now comfort care.  Will sign off.   [place], OTR/L [phone]   Lucille Passy M 01/10/2018, 9:48 AM

## 2018-01-10 NOTE — Progress Notes (Signed)
PULMONARY / CRITICAL CARE MEDICINE   Name: [name] MRN: [id number] DOB: 05/06/1951    ADMISSION DATE:  12/28/2017 CONSULTATION DATE: January 07, 2018   REFERRING MD:  Dr Jerrell Belfast  CHIEF COMPLAINT: Intraparenchymal hemorrhage  HISTORY OF PRESENT ILLNESS:   67 year old male with past medical history as below, which is significant for COPD, nonobstructive coronary artery disease, prostate cancer (Deferred therapy, PET in 08/2107 with no evidence of mets), and hypertension. was recently hospitalized for recent productive cough, shortness of breath, wheezing and chest pain.  A CT showed some new peripheral lung nodules. One of 3 AFB specimens was positive at that time. ID was consulted and recommended DC antibiotics. He had nodule biopsy done 4/11. No organisms identified. Cytology was pending. He was then admitted again to [facility] on 4/20 for worsening cough. He had another culture come back AFB positive, but mTB PCR was negative.  ID was again consulted and have been directing ABX.  Also developed small pleural effusion which was sampled. Found to be an exudate without lymph predominance making it unlikely to be related to NTM infection. He also had an echo done 4/28 which demonstrated severe systolic CHF, which was a new finding at the time (LVEF 25-30%). Around that same time he became lethargic, which persisted for several hours. He underwent contrast CT of the head, which identified a 10 mm acute IPH centered at the right thalamus without significant edema. He was seen by neurology who recommended transfer to ICU for tight BP control. PCCM consulted for medical care in ICU.   After long discussion, family has opted for comfort care.    SUBJECTIVE:  Currently protecting airway. Good response to morphine for grimacing.   VITAL SIGNS: BP (!) 163/92   Pulse 70   Temp 97.7 F (36.5 C) (Axillary)   Resp 13   Ht 5\' 4"  (1.626 m)   Wt 111 lb 9.6 oz (50.6 kg)   SpO2 97%   BMI 19.16 kg/m     INTAKE / OUTPUT: I/O last 3 completed shifts: In: 75 [I.V.:1; IV Piggyback:610] Out: 1225 [Urine:1225]  PHYSICAL EXAMINATION: General:  Frail, thin male who appears older than stated age, much different mental status than yesterday, somnolent  Neuro:  Lethargic, opens eyes to stimulation, does not follow commands, repositions himself in bed  HEENT:  [place]/AT, PERRL, no JVD Cardiovascular:  RRR, no MRG Lungs: resps even non labored on RA, protecting airway  Abdomen:   Non-distended. Soft.  Musculoskeletal:  Frail, no edema  Skin: Grossly intact  LABS:  BMET Recent Labs  Lab 01/06/18 0459 01/07/18 0514 01/09/18 1026  NA 132* 132* 135  K 4.7 4.4 4.5  CL 100* 95* 99*  CO2 25 26 29   BUN 42* 43* 37*  CREATININE 1.17 1.21 1.31*  GLUCOSE 104* 88 134*    Electrolytes Recent Labs  Lab 01/05/18 0607 01/06/18 0459 01/07/18 0514 01/09/18 1026  CALCIUM 8.4* 8.2* 8.5* 8.5*  MG 2.0  --   --   --     CBC Recent Labs  Lab 01/05/18 0607 01/06/18 0459 01/07/18 0514  WBC 11.5* 22.5* 25.4*  HGB 12.3* 10.8* 11.1*  HCT 35.5* 31.3* 32.5*  PLT 591* 643* 627*    Coag's No results for input(s): APTT, INR in the last 168 hours.  Sepsis Markers Recent Labs  Lab 01/09/18 1026 01/09/18 1307  LATICACIDVEN 1.3 1.4    ABG Recent Labs  Lab 01/06/18 1530  PHART 7.471*  PCO2ART 33.8  PO2ART 84.4  Liver Enzymes Recent Labs  Lab 01/07/18 0514 01/09/18 1026  AST 79* 36  ALT 107* 62  ALKPHOS 120 110  BILITOT 0.7 1.1  ALBUMIN 1.4* 1.6*    Cardiac Enzymes No results for input(s): TROPONINI, PROBNP in the last 168 hours.  Glucose No results for input(s): GLUCAP in the last 168 hours.  Imaging Ct Head Wo Contrast  Addendum Date: 01/09/2018   ADDENDUM REPORT: 01/09/2018 11:41 ADDENDUM: Critical Value/emergent results were called by telephone at the time of interpretation on 01/09/2018 at 1133 hours to Dr. Antony Contras , who verbally acknowledged these results. We  discussed the consideration of septic emboli given this constellation of findings, although so far it seems to be only the one thalamic lacune which has hemorrhaged. Electronically Signed   By: Genevie Ann M.D.   On: 01/09/2018 11:41   Result Date: 01/09/2018 CLINICAL DATA:  67 year old male with small acute intra-axial hemorrhage in the right thalamus detected on noncontrast CT, and multiple scattered acute infarcts in both hemispheres on MRI. Increased confusion today. EXAM: CT HEAD WITHOUT CONTRAST TECHNIQUE: Contiguous axial images were obtained from the base of the skull through the vertex without intravenous contrast. COMPARISON:  Brain MRI and intracranial MRA 01/07/2018. Head CT 01/06/2018, and earlier. FINDINGS: Brain: Substantial progression of the right thalamic hemorrhage since 01/07/2018. New moderate to large intraventricular extension of blood. Estimated intra-axial blood volume is now 20 milliliters (versus approximately 1 milliliter on the initial head CT), now encompassing 38 x 30 x 35 millimeters (AP by transverse by CC). Confluent surrounding edema. New mild ventriculomegaly including evidence of transependymal edema along the left temporal horn. New leftward midline shift of 9 millimeters. Basilar cisterns remain patent. The superimposed small scattered bilateral mostly white matter infarcts appear stable to the diffusion imaging on 01/07/2018. No superimposed cortical cytotoxic edema is identified. Vascular: Calcified atherosclerosis at the skull base. Skull: No acute osseous abnormality identified. Sinuses/Orbits: Stable and clear aside from unchanged posterior right ethmoid opacification. Other: Mildly Disconjugate gaze. No other acute orbit or scalp soft tissue finding. IMPRESSION: 1. Substantial progression of the right thalamic hemorrhage since 01/06/2018 with intra-axial blood volume now estimated at 20 mL. Confluent surrounding edema and new leftward midline shift of 9 mm. 2. New moderate  to large intraventricular extension of hemorrhage with acute ventriculomegaly and mild transependymal edema. 3. Basilar cisterns remain normal. 4. Scattered small infarcts demonstrated by MRI appears stable by CT. No new infarct identified. Electronically Signed: By: Genevie Ann M.D. On: 01/09/2018 11:31   Dg Chest Port 1 View  Result Date: 01/09/2018 CLINICAL DATA:  Cough EXAM: PORTABLE CHEST 1 VIEW COMPARISON:  January 04, 2018 FINDINGS: There is somewhat less consolidation in the right upper lobe compared to recent study. There is a small right pleural effusion with patchy consolidation in the lateral right base. There is also new subtle consolidation in the medial right base. There is mild atelectatic change in the left mid lung, stable. Heart size and pulmonary vascularity are normal. No adenopathy. No bone lesions. IMPRESSION: 1. New areas of patchy opacity right base. Persistent small right pleural effusion. 2. Less consolidation right upper lobe compared to most recent study. 3.  Stable left midlung atelectasis. 4.  Stable cardiac silhouette. Electronically Signed   By: Lowella Grip III M.D.   On: 01/09/2018 10:24     STUDIES:  CT head 4/29 > 10 mm acute IPH centered at the right thalamus without significant edema. Echo 4/29 > The cavity size was  normal. Systolic function was severely reduced. The estimated ejection fraction was in the range of 25% to 30%. Diffuse hypokinesis. Doppler parameters are consistent with abnormal left ventricular relaxation (grade 1 diastolic dysfunction). Moderate MR. CT head 5/2>>> 1. Substantial progression of the right thalamic hemorrhage since 01/06/2018 with intra-axial blood volume now estimated at 20 mL. Confluent surrounding edema and new leftward midline shift of 9 mm. 2. New moderate to large intraventricular extension of hemorrhage with acute ventriculomegaly and mild transependymal edema. 3. Basilar cisterns remain normal. 4. Scattered small infarcts  demonstrated by MRI appears stable by CT. No new infarct identified.  CULTURES: Blood 4/20: neg Pleural fluid culture 4/26 >NEG Pleural fluid acid fast smear 4/26 > Pleural fluid fungal culture 4/26 >  Blood culture 4/29 >  ANTIBIOTICS: Cefoxitin Tigecycline Azitrhomycin  SIGNIFICANT EVENTS: 4/20 admit 4/26 thoracentesis (exudate, no lymph prominence) 4/29 Card consult, new HF noted on echo 4/29 lethargy. Head CT showed small IPH. Transferred to ICU for antiHTN gtt.   LINES/TUBES:   DISCUSSION:  67 year old male with recent eval for pulmonary infection initially concerning for TB, now being treated for NTM.  Admitted 4/20 for lobar PNA. ABX directed by ID. Course c/b thalamic ICH.  Was improving slowly and had acute change in mental status 5/2. CT revealed substantial progression of ICH with edema, midline shift and large intraventricular extension of hemorrhage.  Family have opted for palliative care.  ASSESSMENT / PLAN:  Palliative care consultation for hospice.  Kipp Brood, MD 01/10/2018  9:36 AM Pager: [phone] or [phone]

## 2018-01-10 NOTE — Care Management Note (Signed)
Case Management Note  Original note by: Carles Collet, RN 12/31/2017, 10:01 AM    Patient Details  Name: Beckam Abdulaziz MRN: [id number] Date of Birth: 05-18-1951  Subjective/Objective:                 Spoke w patient a t bedside. Readmission after being home for a week. Patient being treating for severe PNA. Patient from home alone, has a daughter in [place] 30 minutes away. Denies having any DME at home, no O2, no nebulizer. Will benefit from [facility] likely at DC. Patient is open to any [place]. CM will continue to follow.    Action/Plan:   Expected Discharge Date:                  Expected Discharge Plan:  [place]  In-House Referral:     Discharge planning Services  CM Consult  Post Acute Care Choice:    Choice offered to:     DME Arranged:    DME Agency:     HH Arranged:    [place] Agency:     Status of Service:  In process, will continue to follow  If discussed at Long Length of Stay Meetings, dates discussed:    Additional Comments:  01/10/18 J. Shonn Farruggia, RN, BSN Pt with medical decline; now transitioning to comfort care and transferring to palliative unit.   Ella Bodo, RN 01/10/2018, 2:02 PM

## 2018-01-10 NOTE — Progress Notes (Signed)
   01/10/18 0929  Clinical Encounter Type  Visited With Patient and family together;Health care provider  Visit Type Initial;Patient actively dying  Referral From Nurse  Consult/Referral To Chaplain  Spiritual Encounters  Spiritual Needs Prayer;Grief support   Responded to a SCC for EOL.  Daughter and another family member at the bedside.  Daughter indicated her mother died 2 years ago.  Family shared about his faith life and that they had spiritual support from his church.  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  Prayed with the family.  Will follow and support as needed. Chaplain Katherene Ponto

## 2018-01-10 NOTE — Progress Notes (Signed)
Stroke Team Progress Note     SUBJECTIVE Patient significant other is at the bedside.  Patient has been made DNR and comfort care by family.Patient's significant other is at the bedside. Patient has been made DO NOT RESUSCITATE and comfort care by family.  He is resting peacefully. . OBJECTIVE Most recent Vital Signs:   Respiratory Rate: 13 O2 Saturdation: 97%  CBG (last 3)  No results for input(s): GLUCAP in the last 72 hours.  Diet:  Diet Order           Diet NPO time specified  Diet effective now           Activity: bedrest VTE Prophylaxis:  SCDs  Studies: Results for orders placed or performed during the hospital encounter of 12/28/17 (from the past 24 hour(s))  Lactic acid, plasma     Status: None   Collection Time: 01/09/18 10:26 AM  Result Value Ref Range   Lactic Acid, Venous 1.3 0.5 - 1.9 mmol/L  Comprehensive metabolic panel     Status: Abnormal   Collection Time: 01/09/18 10:26 AM  Result Value Ref Range   Sodium 135 135 - 145 mmol/L   Potassium 4.5 3.5 - 5.1 mmol/L   Chloride 99 (L) 101 - 111 mmol/L   CO2 29 22 - 32 mmol/L   Glucose, Bld 134 (H) 65 - 99 mg/dL   BUN 37 (H) 6 - 20 mg/dL   Creatinine, Ser 1.31 (H) 0.61 - 1.24 mg/dL   Calcium 8.5 (L) 8.9 - 10.3 mg/dL   Total Protein 6.0 (L) 6.5 - 8.1 g/dL   Albumin 1.6 (L) 3.5 - 5.0 g/dL   AST 36 15 - 41 U/L   ALT 62 17 - 63 U/L   Alkaline Phosphatase 110 38 - 126 U/L   Total Bilirubin 1.1 0.3 - 1.2 mg/dL   GFR calc non Af Amer 55 (L) >60 mL/min   GFR calc Af Amer >60 >60 mL/min   Anion gap 7 5 - 15  Urinalysis, Routine w reflex microscopic     Status: Abnormal   Collection Time: 01/09/18 11:15 AM  Result Value Ref Range   Color, Urine STRAW (A) YELLOW   APPearance CLEAR CLEAR   Specific Gravity, Urine 1.008 1.005 - 1.030   pH 7.0 5.0 - 8.0   Glucose, UA NEGATIVE NEGATIVE mg/dL   Hgb urine dipstick MODERATE (A) NEGATIVE   Bilirubin Urine NEGATIVE NEGATIVE   Ketones, ur NEGATIVE NEGATIVE mg/dL   Protein, ur NEGATIVE NEGATIVE mg/dL   Nitrite NEGATIVE NEGATIVE   Leukocytes, UA NEGATIVE NEGATIVE   RBC / HPF 6-10 0 - 5 RBC/hpf   WBC, UA 0-5 0 - 5 WBC/hpf   Bacteria, UA NONE SEEN NONE SEEN  Lactic acid, plasma     Status: None   Collection Time: 01/09/18  1:07 PM  Result Value Ref Range   Lactic Acid, Venous 1.4 0.5 - 1.9 mmol/L     Ct Head Wo Contrast  Addendum Date: 01/09/2018   ADDENDUM REPORT: 01/09/2018 11:41 ADDENDUM: Critical Value/emergent results were called by telephone at the time of interpretation on 01/09/2018 at 1133 hours to Dr. Antony Contras , who verbally acknowledged these results. We discussed the consideration of septic emboli given this constellation of findings, although so far it seems to be only the one thalamic lacune which has hemorrhaged. Electronically Signed   By: Genevie Ann M.D.   On: 01/09/2018 11:41   Result Date: 01/09/2018 CLINICAL DATA:  67 year old male with small  acute intra-axial hemorrhage in the right thalamus detected on noncontrast CT, and multiple scattered acute infarcts in both hemispheres on MRI. Increased confusion today. EXAM: CT HEAD WITHOUT CONTRAST TECHNIQUE: Contiguous axial images were obtained from the base of the skull through the vertex without intravenous contrast. COMPARISON:  Brain MRI and intracranial MRA 01/07/2018. Head CT 01/06/2018, and earlier. FINDINGS: Brain: Substantial progression of the right thalamic hemorrhage since 01/07/2018. New moderate to large intraventricular extension of blood. Estimated intra-axial blood volume is now 20 milliliters (versus approximately 1 milliliter on the initial head CT), now encompassing 38 x 30 x 35 millimeters (AP by transverse by CC). Confluent surrounding edema. New mild ventriculomegaly including evidence of transependymal edema along the left temporal horn. New leftward midline shift of 9 millimeters. Basilar cisterns remain patent. The superimposed small scattered bilateral mostly white matter  infarcts appear stable to the diffusion imaging on 01/07/2018. No superimposed cortical cytotoxic edema is identified. Vascular: Calcified atherosclerosis at the skull base. Skull: No acute osseous abnormality identified. Sinuses/Orbits: Stable and clear aside from unchanged posterior right ethmoid opacification. Other: Mildly Disconjugate gaze. No other acute orbit or scalp soft tissue finding. IMPRESSION: 1. Substantial progression of the right thalamic hemorrhage since 01/06/2018 with intra-axial blood volume now estimated at 20 mL. Confluent surrounding edema and new leftward midline shift of 9 mm. 2. New moderate to large intraventricular extension of hemorrhage with acute ventriculomegaly and mild transependymal edema. 3. Basilar cisterns remain normal. 4. Scattered small infarcts demonstrated by MRI appears stable by CT. No new infarct identified. Electronically Signed: By: Genevie Ann M.D. On: 01/09/2018 11:31   Dg Chest Port 1 View  Result Date: 01/09/2018 CLINICAL DATA:  Cough EXAM: PORTABLE CHEST 1 VIEW COMPARISON:  January 04, 2018 FINDINGS: There is somewhat less consolidation in the right upper lobe compared to recent study. There is a small right pleural effusion with patchy consolidation in the lateral right base. There is also new subtle consolidation in the medial right base. There is mild atelectatic change in the left mid lung, stable. Heart size and pulmonary vascularity are normal. No adenopathy. No bone lesions. IMPRESSION: 1. New areas of patchy opacity right base. Persistent small right pleural effusion. 2. Less consolidation right upper lobe compared to most recent study. 3.  Stable left midlung atelectasis. 4.  Stable cardiac silhouette. Electronically Signed   By: Lowella Grip III M.D.   On: 01/09/2018 10:24    Physical Exam:    frail middle-aged African-American male  . Marland Kitchen Afebrile. Head is nontraumatic. Neck is supple without bruit.    Cardiac exam no murmur or gallop. Lungs are  clear to auscultation. Distal pulses are well felt. Neurological Exam :   obtunded and unresponsive. Does not  opens eyes to sternal rub. Right gaze preference  has reflex eye movements. Does not blink to threat on either side. Does not speak or follow any commands. Dense right hemiplegia with hypotonia. Partial withdrawal in the left upper and lower extremity to painful stimuli. Right plantar upgoing left downgoing. Gait not tested  Deep tendon reflexes are diminished on the right normal on the left.  ASSESSMENT Mr. [name] is a 67 y.o. 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 Etiology of the strokes possibly  cardioembolic asechocardiogram   shows diminished ejection fraction of 20-25% and septic embolism from pneumonia with  atypical mycobacterium would be unlikely. Hospital day # 13  TREATMENT/PLAN  he has had significant neurological  worsening  from thalamic hemorrhage with intraventricular extension and hydrocephalus. His prognosis is quite poor. Will likely need long-term ventilatory support and survival with difficulty and need nursing home placement with tracheostomy and PEG tube. The patient's daughter is has made  Decision about goals of care. And prefers comfort care and DNR I had a long discussion with the patient's daughter, stepdaughter and niece at the bedside  Y`day and answered questions.Marland Kitchen  Marland KitchenThis patient is critically ill and at significant risk of neurological worsening, death and care requires constant monitoring of vital signs, hemodynamics,respiratory and cardiac monitoring, extensive review of multiple databases, frequent neurological assessment, discussion with family, other specialists and medical decision making of high complexity.I have made any additions or clarifications directly to the above note.This  critical care time does not reflect procedure time, or teaching time or supervisory time of PA/NP/Med Resident etc but could involve care discussion time.  I spent 30 minutes of neurocritical care time  in the care of  this patient.   d/e Dr Lynetta Mare. Stroke team will sign off. Call for questions   Antony Contras, MD [facility] Pager: [phone] 01/10/2018 10:14 AM

## 2018-01-10 NOTE — Progress Notes (Signed)
PT Cancellation/Discharge Note  Patient Details Name: Shonte Soderlund MRN: [id number] DOB: [birth year]   Cancelled Treatment:    Reason Eval/Treat Not Completed: Medical issues which prohibited therapy  Noted PT order discontinued with decline in pt status and now on comfort measures.  PT signing off.   Reginia Naas 01/10/2018, 8:51 AM  Magda Kiel, [place] 01/10/2018

## 2018-01-10 NOTE — Progress Notes (Signed)
Palliative Medicine RN Note: Consult order noted to set up residential hospice. Spoke with Dr Lynetta Mare; family is in agreement, and goals are clear. SW can set up hospice without waiting on PMT conversations. Obtained order for SW assistance. PMT consult cancelled.  Marjie Skiff Nataniel Gasper, RN, BSN, [facility] Palliative Medicine Team 01/10/2018 10:10 AM Office [phone]

## 2018-01-10 NOTE — Progress Notes (Signed)
[facility] Liaison:  RN   Received request from [place], Yaak, for family interest in [facility].  Chart reviewed and tried to contact daughter, Janett Billow, for acknowledgement of referral.  Mailbox was full.  Not in patient room.  Unfortunately, [facility] is unable to offer a room today.  Michiel Cowboy, CSW, is aware [facility]  Liaison will follow up with CSW and family tomorrow or sooner if room becomes available.  Please do not hesitate to call with questions.   Thank you for this referral.   Edyth Gunnels, RN, [facility] Liaison [phone]  All hospital liaisons are now on [place].

## 2018-01-10 NOTE — Social Work (Signed)
[place] referral made to [facility], there are currently no beds available for residential hospice transfer today, they will continue to follow and alert CSW dept if there is availability for pt.   Will leave handoff for weekend CSW to follow up on referral.   Dajon Rowe H Medea Deines, [place] Work [phone]

## 2018-01-10 NOTE — Clinical Social Work Note (Signed)
Clinical Social Worker met with patient biological daughter, Janett Billow at bedside along with patient fiance and "step son" per RN request.  Patient daughter requesting power of attorney.  CSW explained the process for power of attorney and inability to complete healthcare power of attorney due to patient current medical state (obtunded).  Patient daughter not pleased with CSW answer but found comfort in knowing that she is to consent and be decision maker on patient behalf for healthcare decisions as closest next of kin.  CSW also relayed that if patient daughter or other family acts out inappropriately the hospital will have the ability to limit visitation and have potential restrictions - patient daughter verbalized understanding.  Patient daughter agreed that patient has transitioned to comfort care and she remains in agreement with this plan.    Please reconsult CSW if patient becomes medically stable and requires discharge planning needs, such as residential hospice vs. SNF with Hospice services following.

## 2018-01-11 LAB — CULTURE, BLOOD (ROUTINE X 2)
Culture: NO GROWTH
Special Requests: ADEQUATE

## 2018-01-11 NOTE — Progress Notes (Signed)
[facility] Liaison: RN note  Spoke with daughter Janett Billow by phone today to advise that there is no bed availability today for [facility].  Michiel Cowboy, CSW, is aware. [facility] Liaison will follow up with CSW and family tomorrow or sooner if room becomes available.  Please do not hesitate to call with questions.  Thank you for this referral.  Farrel Gordon, RN, [facility] Liaison [place] are on [place]

## 2018-01-12 NOTE — Progress Notes (Signed)
[facility] PCCM Progress Note    Brief Summary: 67 y/o M admitted with AMS.  Found to have bilateral embolic infarcts with hemorrhagic right thalamic infarct.  CVA source felt to be cardioembolic with EF of 20-10%.  In addition, he has PNA raising possible source as septic embolic.  Course prolonged with ventilatory support.  He had worsening from thalamic hemorrhage with intraventricular extension / hydrocephalus.  Family elected to proceed with comfort measures.     S:  RN reports pt required PRN dose morphine this am.  Family feels he looks more comfortable.     O: Blood pressure (!) 160/118, pulse (!) 110, temperature 97.9 F (36.6 C), temperature source Axillary, resp. rate 20, height 5\' 4"  (1.626 m), weight 111 lb 9.6 oz (50.6 kg), SpO2 97 %.  General: cachectic male in NAD HEENT: MM pink/moist Neuro: no response to voice CV: s1s2 rrr, no m/r/g PULM: even/non-labored, lungs bilaterally clear  OF:HQRF, non-tender, bsx4 active  Extremities: warm/dry, no edema  Skin: no rashes or lesions   A: CVA  PNA  Acute hypoxic respiratory failure   P: Full comfort care DNR/DNI  Care management working on bed at [facility]  PRN morphine for pain / increased work of breathing  PRN ativan for anxiety / comfort    Noe Gens, NP-C [place] Pgr: [phone] or if no answer [phone] 01/12/2018, 11:20 AM

## 2018-01-13 MED ORDER — MORPHINE SULFATE (CONCENTRATE) 10 MG/0.5ML PO SOLN
5.0000 mg | Freq: Three times a day (TID) | ORAL | Status: DC
  Administered 2018-01-13 – 2018-01-15 (×7): 5 mg via SUBLINGUAL
  Filled 2018-01-13 (×7): qty 0.5

## 2018-01-13 NOTE — Progress Notes (Signed)
Nutrition Brief Note  Chart reviewed. Pt now transitioning to comfort care.  No further nutrition interventions warranted at this time.  Please re-consult as needed.   Johnte Portnoy A. Heba Ige, RD, LDN, CDE Pager: [phone] After hours Pager: [phone]  

## 2018-01-13 NOTE — Progress Notes (Addendum)
6N-19 [facility] Liaison RN note  Met with daughter Romelle Starcher in the room to advise that [facility] does not have a bed to offer today. Thomia has concerns with patient not having scheduled pain medications as patient was a bit restless in the bed when she arrived and "had tears in his eyes". Paged Noe Gens, NP who is placing scheduled medication orders.  Thomia also inquired about placement at [facility] if [facility] does not have a bed available today. Phone call placed to [facility] with [place] to inquire of bed availability. Message left on voicemail.  Notified Thomia of Hospital Liaison from [place] coming to see patient and family. Thomia had spoken to sister Janett Billow who lives in [place] and she prefers not to transfer patient to [facility] due to the commute. Family is requesting to wait for bed to become available at [facility].  Updated Kathlee Nations, CSW of above.  Thank you, Margaretmary Eddy, RN, [facility] Liaison [phone]  [place] are on [facility].

## 2018-01-13 NOTE — Progress Notes (Signed)
PCCM Progress Note  Admission date: 12/28/2017 Referring provider: Dr. Sarajane Jews, [facility] CC: Short of breath  HPI: 67 yo male former smoker presented with dyspnea, cough from PNA with hyponatremia.  ID consulted.  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  Had Rt thoracentesis.  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  Neurology consulted.  He was found to have acute systolic CHF and cardiology consulted.  Mental status worse on 01/09/18 with progression of ICH.  Family opted for comfort measures and hospice care. PMHx of COPD, prostate cancer, HTN, CAD.  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

## 2018-01-13 NOTE — Plan of Care (Signed)
  Problem: Ischemic Stroke/TIA Tissue Perfusion: Goal: Complications of ischemic stroke/TIA will be minimized Outcome: Not Progressing   

## 2018-01-13 NOTE — Progress Notes (Signed)
CSW following for discharge planning. CSW contacted by Olivia Mackie with [facility] that there are no beds available today; please see her note. Per family, wanting to wait for a bed at [facility].  Patient still struggling to manage pain today; updated meds to be scheduled to work on pain management more appropriately before transfer. CSW will continue to follow.  Laveda Abbe, [place] Clinical Social Worker [phone]

## 2018-01-14 NOTE — Progress Notes (Signed)
[facility] [place]  Continue to follow for family interest in [facility]. Unfortunately [facility] is not able to offer room today. CSWs [place] aware. Will continue to follow, update staff and family if room becomes available.   Thank you,  Erling Conte, LCSW [phone]

## 2018-01-14 NOTE — Plan of Care (Signed)
  Problem: Ischemic Stroke/TIA Tissue Perfusion: Goal: Complications of ischemic stroke/TIA will be minimized Outcome: Not Progressing   

## 2018-01-14 NOTE — Progress Notes (Signed)
PCCM Interval Progress Note  Called by RN around 1420 informing me that pt's daughter, Janett Billow had called requesting a head CT.  I called Janett Billow and asked if I could answer questions.  She was confused on the plan of care since she said "he's just lying there and is still breathing on his own".  I updated her on his clinical condition and informed her that yes while pt is in fact breathing on his own, we have no control over how long he will be able to sustain this and that at any point, he may expire. I explained that imaging and procedures would not provide any benefit in his situation.  She voiced understanding and appeared to be possibly be frustrated with other family members who might have informed her of different circumstances, etc.  She thanked me for further explanation of her fathers condition as well as advocating for his comfort rather than putting him through more pain and suffering.  Comfort care to continue.  Emotional support offered to daughter.   Montey Hora, [place] [facility] Pager: [phone]  or [phone] 01/14/2018, 6:06 PM

## 2018-01-14 NOTE — Progress Notes (Signed)
PCCM Progress Note  Admission date: 12/28/2017 Referring provider: Dr. Sarajane Jews, [facility] CC: Short of breath  HPI: 67 yo male former smoker presented with dyspnea, cough from PNA with hyponatremia.  ID consulted.  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  Had Rt thoracentesis.  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  Neurology consulted.  He was found to have acute systolic CHF and cardiology consulted.  Mental status worse on 01/09/18 with progression of ICH.  Family opted for comfort measures and hospice care. PMHx of COPD, prostate cancer, HTN, CAD.  Subjective: Comfortable resting in bed, not awake, not responsive.  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   Montey Hora, [place] [facility] Pager: [phone]  or [phone] 01/14/2018, 10:20 AM

## 2018-01-14 NOTE — Progress Notes (Signed)
Received a call from daughter Janett Billow requesting if we can do CT scan of the head on this patient. When asked why is she requesting for such, daughter stated I just want know what is going on inside the patients head. Explained to the patients daughter about the plan of care and asked her if she is changing her mind, She said no but I just want to know what's going on. Md made aware of patient daughter request.

## 2018-01-15 MED ORDER — MORPHINE SULFATE (CONCENTRATE) 10 MG/0.5ML PO SOLN: 5.0000 mg | Freq: Three times a day (TID) | ORAL | Status: AC

## 2018-01-15 NOTE — Social Work (Signed)
Aware pt has a bed today for discharge to [facility], have paged critical care/pulmonary regarding discharge.   Alexander Mt, [place] Work [phone]

## 2018-01-15 NOTE — Progress Notes (Signed)
Report called to [facility] PIV removed Paperwork sent went PTAR to [facility]

## 2018-01-15 NOTE — Discharge Summary (Signed)
[facility] Physician Discharge Summary  [name] GUY:[id number] DOB: 22-Feb-1951 DOA: 12/28/2017  PCP: Bernerd Limbo, MD  Admit date: 12/28/2017 Discharge date: 01/15/2018  Time spent: 35 minutes  Recommendations for Outpatient Follow-up:  [facility] for Hospice  Discharge Diagnoses:  Principal Problem:   Lobar pneumonia ([place]) Active Problems:   G E R D   Lung nodules   Hyponatremia   Mycobacterial disease   Pleural effusion   Altered mental status   Protein-calorie malnutrition, severe   Intracerebral hemorrhage   Pleural effusion on right   Other ascites   Benign essential HTN   Tachypnea   Tachycardia   Coronary artery disease involving native coronary artery of native heart without angina pectoris   Leukocytosis   Acute blood loss anemia   AKI (acute kidney injury) [facility])   Discharge Condition: Guarded  Diet recommendation: Comfort Diet  Filed Weights   01/03/18 0502 01/05/18 0557 01/06/18 0555  Weight: 50.3 kg (110 lb 14.4 oz) 54.9 kg (121 lb 0.5 oz) 50.6 kg (111 lb 9.6 oz)    History of present illness:  67 year old man PMH former smoker, subclinical COPD, prostate cancer who presented with worsening shortness of breath and cough.  Chest x-ray showed developing infiltrates in the upper and lower lung zones, patient was started on antibiotics and referred for admission.  Patient was admitted 4/8-4/13 for progressive shortness of breath and productive cough.  CT chest showed multiple lung nodules of unknown etiology and the patient was seen by infectious disease.  Pulmonology was consulted for possible bronchoscopy but lesions were too peripheral.  Patient underwent CT-guided lung nodule biopsy (possibilities include resolving pneumonia or less likely inflammatory myofibroblastic tumor, not concerning for lymphoproliferative disorder. 3 sets of AFB smears were negative.  Infectious disease recommended discontinuing antibiotics and follow-up as an outpatient, especially  culture and pathology data.  In detailed history, the patient's only known risk factor for tuberculosis was remote incarceration.  No significant travel history.  See Dr. Lucianne Lei Dam's note from 4/9 for more details.  He was seen in the outpatient setting by Dr. Megan Salon 4/16 at which time the only isolate from his sputum was Candida albicans which was representative of normal respiratory flora.  He presented today 4/20 with several day history of worsening shortness of breath, cough, without specific aggravating or alleviating factors.  Associated shortness of breath.  Symptoms are becoming worse.  Of note AFB culture obtained April 9, was reported positive today 4/20, negative M tuberculosis complex, other Mycobacterium species pending.  Chest x-ray today shows developing infiltrates in the upper and lower lung fields.  Hospital Course:  Patient admitted with pneumonia and hyponatremia.  Had thoracentesis of right-sided pleural effusion.  Patient then later had a right thalamus intracerebral hemorrhage.  Neurology was consulted.  Patient then also developed acute systolic congestive heart failure cardiology was consulted.  Patient began to have worsening of his mental status.  Family then opted for comfort measures and hospice care.  Procedures:  ECHO  Consultations:  Neurology, Cardiology, ID, Critical Care, PMR  Discharge Exam: Vitals:   01/15/18 0417 01/15/18 0951  BP: (!) 148/93   Pulse: (!) 139   Resp: 15 (!) 38  Temp: 99.8 F (37.7 C)   SpO2: 94%     General: somnolent, comfortable Cardiovascular: RRR, no mrg Respiratory: nl wob, no wheeze  Discharge Instructions   Discharge Instructions    Call MD for:  difficulty breathing, headache or visual disturbances   Complete by:  As directed  Call MD for:  persistant nausea and vomiting   Complete by:  As directed    Call MD for:  severe uncontrolled pain   Complete by:  As directed    Call MD for:  temperature >100.4    Complete by:  As directed    Diet - low sodium heart healthy   Complete by:  As directed    Increase activity slowly   Complete by:  As directed      Allergies as of 01/15/2018   No Known Allergies     Medication List    STOP taking these medications   albuterol 108 (90 Base) MCG/ACT inhaler Commonly known as:  PROVENTIL HFA;VENTOLIN HFA   aspirin 81 MG chewable tablet   benzonatate 100 MG capsule Commonly known as:  TESSALON   dextromethorphan-guaiFENesin 30-600 MG 12hr tablet Commonly known as:  MUCINEX DM   multivitamin with minerals Tabs tablet   OVER THE COUNTER MEDICATION   OVER THE COUNTER MEDICATION   sildenafil 20 MG tablet Commonly known as:  REVATIO   traMADol 50 MG tablet Commonly known as:  ULTRAM     TAKE these medications   acetaminophen 500 MG tablet Commonly known as:  TYLENOL Take 500 mg by mouth every 6 (six) hours as needed for headache (pain).   morphine CONCENTRATE 10 MG/0.5ML Soln concentrated solution Place 0.25 mLs (5 mg total) under the tongue every 8 (eight) hours.      No Known Allergies    The results of significant diagnostics from this hospitalization (including imaging, microbiology, ancillary and laboratory) are listed below for reference.    Significant Diagnostic Studies: Dg Chest 1 View  Result Date: 01/03/2018 CLINICAL DATA:  Post thoracentesis. EXAM: CHEST  1 VIEW COMPARISON:  Chest radiograph earlier today. FINDINGS: RIGHT pleural effusion is decreased. There is no pneumothorax. Stable cardiac silhouette and BILATERAL pulmonary opacities. IMPRESSION: Improved RIGHT pleural effusion post thoracentesis. Electronically Signed   By: Staci Righter M.D.   On: 01/03/2018 15:10   Dg Chest 2 View  Result Date: 12/28/2017 CLINICAL DATA:  Chest pain and shortness of breath for 1 week EXAM: CHEST - 2 VIEW COMPARISON:  12/19/2017 FINDINGS: Cardiac shadow is within normal limits. Nodular densities are again identified throughout the  lungs bilaterally. Small right-sided pleural effusion is noted new from the prior study. Some patchy changes are noted in the upper lobes bilaterally which may represent some early infiltrate. No pneumothorax is identified. IMPRESSION: Stable nodular densities bilaterally.  No pneumothorax is noted. New small right-sided pleural effusion. Patchy changes in the upper lobes bilaterally increased from the previous exam which may represent some early infiltrate. Electronically Signed   By: Inez Catalina M.D.   On: 12/28/2017 15:45   Dg Chest 2 View  Result Date: 12/16/2017 CLINICAL DATA:  Productive cough for 1 week.  Chest pain. EXAM: CHEST - 2 VIEW COMPARISON:  Chest CT and chest radiograph on 12/08/2017. FINDINGS: Heart size is normal. Mild mediastinal lymphadenopathy in the right superior paratracheal region shows no significant change. Multiple small irregular nodular densities are again seen bilaterally, with upper lobe predominance. One of these nodules in the lateral right lung base shows cystic lucency which may represent cavitation. No evidence of pleural effusion. IMPRESSION: No significant change in multiple ill-defined pulmonary nodular opacities with upper lobe predominance and right paratracheal lymphadenopathy. These findings are suspicious for infectious etiology, possibly fungal pneumonia, tuberculosis, or septic emboli. Electronically Signed   By: Sharrie Rothman.D.  On: 12/16/2017 20:26   Ct Head Wo Contrast  Addendum Date: 01/09/2018   ADDENDUM REPORT: 01/09/2018 11:41 ADDENDUM: Critical Value/emergent results were called by telephone at the time of interpretation on 01/09/2018 at 1133 hours to Dr. Antony Contras , who verbally acknowledged these results. We discussed the consideration of septic emboli given this constellation of findings, although so far it seems to be only the one thalamic lacune which has hemorrhaged. Electronically Signed   By: Genevie Ann M.D.   On: 01/09/2018 11:41   Result  Date: 01/09/2018 CLINICAL DATA:  67 year old male with small acute intra-axial hemorrhage in the right thalamus detected on noncontrast CT, and multiple scattered acute infarcts in both hemispheres on MRI. Increased confusion today. EXAM: CT HEAD WITHOUT CONTRAST TECHNIQUE: Contiguous axial images were obtained from the base of the skull through the vertex without intravenous contrast. COMPARISON:  Brain MRI and intracranial MRA 01/07/2018. Head CT 01/06/2018, and earlier. FINDINGS: Brain: Substantial progression of the right thalamic hemorrhage since 01/07/2018. New moderate to large intraventricular extension of blood. Estimated intra-axial blood volume is now 20 milliliters (versus approximately 1 milliliter on the initial head CT), now encompassing 38 x 30 x 35 millimeters (AP by transverse by CC). Confluent surrounding edema. New mild ventriculomegaly including evidence of transependymal edema along the left temporal horn. New leftward midline shift of 9 millimeters. Basilar cisterns remain patent. The superimposed small scattered bilateral mostly white matter infarcts appear stable to the diffusion imaging on 01/07/2018. No superimposed cortical cytotoxic edema is identified. Vascular: Calcified atherosclerosis at the skull base. Skull: No acute osseous abnormality identified. Sinuses/Orbits: Stable and clear aside from unchanged posterior right ethmoid opacification. Other: Mildly Disconjugate gaze. No other acute orbit or scalp soft tissue finding. IMPRESSION: 1. Substantial progression of the right thalamic hemorrhage since 01/06/2018 with intra-axial blood volume now estimated at 20 mL. Confluent surrounding edema and new leftward midline shift of 9 mm. 2. New moderate to large intraventricular extension of hemorrhage with acute ventriculomegaly and mild transependymal edema. 3. Basilar cisterns remain normal. 4. Scattered small infarcts demonstrated by MRI appears stable by CT. No new infarct identified.  Electronically Signed: By: Genevie Ann M.D. On: 01/09/2018 11:31   Ct Head Wo Contrast  Result Date: 01/07/2018 CLINICAL DATA:  Initial evaluation for acute altered mental status. EXAM: CT HEAD WITHOUT CONTRAST TECHNIQUE: Contiguous axial images were obtained from the base of the skull through the vertex without intravenous contrast. COMPARISON:  Prior CT from 11/29/2017 FINDINGS: Brain: Cerebral volume stable, and remains within normal limits. There is an acute intraparenchymal hemorrhage measuring approximately 1 cm in size positioned at the right thalamus (series 3, image 14). Minimal surrounding edema without significant mass effect. No intraventricular extension or other complication. No other acute intracranial hemorrhage. No acute large vessel territory infarct. No mass lesion or midline shift. Ventriculomegaly with asymmetric dilatation of the left lateral ventricle, stable. No extra-axial fluid collection. Vascular: No hyperdense vessel. Calcified atherosclerosis at the skull base. Skull: Scalp soft tissues and calvarium within normal limits. Sinuses/Orbits: Globes and orbital soft tissues within normal limits. Moderate opacification of the right ethmoidal air cells. Paranasal sinuses are otherwise clear. Mastoids are largely clear. Other: None. IMPRESSION: 1. Approximate 1 cm acute intraparenchymal hemorrhage centered at the right thalamus without significant edema. 2. Otherwise stable appearance of the brain. Attempt is being made to contact the ordering clinician. Results will be communicated as soon as possible. Electronically Signed   By: Jeannine Boga M.D.   On: 01/07/2018  00:16   Mr Virgel Paling JQ Contrast  Result Date: 01/07/2018 CLINICAL DATA:  Cerebral hemorrhage suspected EXAM: MRI HEAD WITHOUT CONTRAST MRA HEAD WITHOUT CONTRAST TECHNIQUE: Multiplanar, multiecho pulse sequences of the brain and surrounding structures were obtained without intravenous contrast. Angiographic images of the  head were obtained using MRA technique without contrast. COMPARISON:  Head CT from yesterday FINDINGS: MRI HEAD FINDINGS Brain: Multiple small acute infarcts that are rounded and linear, seen about the lateral ventricles, juxta cortical white matter, and deep gray nuclei-especially the thalami. These are consistent with acute infarcts, possibly embolic. No hemorrhage. Ventriculomegaly with asymmetric lateral ventricular size, larger on the left, likely developmental and stable since at least 2014. FLAIR hyperintensity around the lateral ventricles is also stable from 2014 and attributed to chronic small vessel ischemia. Known acute infarct in the posterior right thalamus, likely hemorrhagic conversion. No masslike enhancement or abnormal regional vessels. No detected progression since prior. Vascular: Normal flow voids and vascular enhancements. 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 Atheromatous irregularity of the left V4 and mid basilar. 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 No flow limiting stenosis or branch occlusions seen in the anterior circulation. Negative for beading or aneurysm. 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 There are large bilateral posterior communicating arteries. IMPRESSION: 1. Scattered small acute infarcts in the bilateral cerebral white matter and thalami primarily. The small right thalamic hematoma is likely hemorrhagic conversion. 2. No emergent finding on intracranial MRA. 3. Atheromatous irregularity of the left V4 segment and basilar with high-grade mid basilar narrowing 4. Chronic ventriculomegaly and asymmetric left lateral ventricle dilatation. Chronic small vessel ischemia. Electronically Signed   By: Monte Fantasia  M.D.   On: 01/07/2018 13:27   Mr Jeri Cos BH Contrast  Result Date: 01/07/2018 CLINICAL DATA:  Cerebral hemorrhage suspected EXAM: MRI HEAD WITHOUT CONTRAST MRA HEAD WITHOUT CONTRAST TECHNIQUE: Multiplanar, multiecho pulse sequences of the brain and surrounding structures were obtained without intravenous contrast. Angiographic images of the head were obtained using MRA technique without contrast. COMPARISON:  Head CT from yesterday FINDINGS: MRI HEAD FINDINGS Brain: Multiple small acute infarcts that are rounded and linear, seen about the lateral ventricles, juxta cortical white matter, and deep gray nuclei-especially the thalami. These are consistent with acute infarcts, possibly embolic. No hemorrhage. Ventriculomegaly with asymmetric lateral ventricular size, larger on the left, likely developmental and stable since at least 2014. FLAIR hyperintensity around the lateral ventricles is also stable from 2014 and attributed to chronic small vessel ischemia. Known acute infarct in the posterior right thalamus, likely hemorrhagic conversion. No masslike enhancement or abnormal regional vessels. No detected progression since prior. Vascular: Normal flow voids and vascular enhancements. 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 Atheromatous irregularity of the left V4 and mid basilar. 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 No flow limiting stenosis or branch occlusions seen in the anterior circulation. Negative for beading or aneurysm. 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 There are large bilateral posterior communicating arteries. IMPRESSION: 1. Scattered small acute infarcts in the bilateral cerebral white matter and thalami primarily. The small right  thalamic  hematoma is likely hemorrhagic conversion. 2. No emergent finding on intracranial MRA. 3. Atheromatous irregularity of the left V4 segment and basilar with high-grade mid basilar narrowing 4. Chronic ventriculomegaly and asymmetric left lateral ventricle dilatation. Chronic small vessel ischemia. Electronically Signed   By: Monte Fantasia M.D.   On: 01/07/2018 13:27   US Abdomen Complete  Result Date: 01/07/2018 CLINICAL DATA:  Abdominal pain EXAM: ABDOMEN ULTRASOUND COMPLETE COMPARISON:  Abdominal and pelvic CT scan of August 26 2017 FINDINGS: Gallbladder: The gallbladder is adequately distended. It contains echogenic sludge. There is no gallbladder wall thickening or positive sonographic Murphy's sign. There is a small amount of pericholecystic fluid but no gallbladder wall thickening. Common bile duct: Diameter: 5 mm Liver: The hepatic echotexture is normal. There is a cyst in the right lobe measuring 1.7 x 1.6 x 1.3 cm which appears stable. There is no intrahepatic ductal dilation. The surface contour of the liver is normal. Portal vein is patent on color Doppler imaging with normal direction of blood flow towards the liver. IVC: No abnormality visualized. Pancreas: Visualization of the pancreatic head and tail is limited. The pancreatic body is normal. Spleen: Size and appearance within normal limits. Right Kidney: Length: 10.6 cm. There is a marked small amount of perinephric fluid. The renal cortical echotexture is similar to that of the adjacent liver. There is mild hydronephrosis. Left Kidney: Length: 9.4 cm. There is a small amount of perinephric fluid on the left. The renal cortical echotexture is increased similar to that on the right. There is no definite hydronephrosis. Abdominal aorta: No aneurysm visualized. Other findings: There is ascites. There are bilateral pleural effusions. IMPRESSION: Ascites including pericholecystic fluid and perinephric fluid. Bilateral pleural effusions.  Mildly distended gallbladder containing sludge. No sonographic evidence of acute cholecystitis. Normal hepatic echotexture. Stable simple appearing right lobe cyst measuring 1.7 cm in greatest dimension. Increased renal cortical echotexture bilaterally consistent with medical renal disease. Mild right-sided hydronephrosis. Electronically Signed   By: David  Martinique M.D.   On: 01/07/2018 12:02   Ct Biopsy  Result Date: 12/19/2017 INDICATION: 67 year old with multiple pulmonary nodules. Request for a lung biopsy for culture and histology. EXAM: CT-GUIDED CORE BIOPSIES OF RIGHT PULMONARY NODULE MEDICATIONS: None. ANESTHESIA/SEDATION: Moderate (conscious) sedation was employed during this procedure. A total of Versed 2.0 mg and Fentanyl 100 mcg was administered intravenously. Moderate Sedation Time: 19 minutes. The patient's level of consciousness and vital signs were monitored continuously by radiology nursing throughout the procedure under my direct supervision. FLUOROSCOPY TIME:  None COMPLICATIONS: None immediate. PROCEDURE: Informed written consent was obtained from the patient after a thorough discussion of the procedural risks, benefits and alternatives. All questions were addressed. A timeout was performed prior to the initiation of the procedure. Patient was placed supine on the CT scanner. Images through the chest were obtained. A peripheral nodule in the right upper lobe was targeted. The right axillary region was prepped and draped in a sterile fashion. Skin and soft tissues were anesthetized with 1% lidocaine. 17 gauge coaxial needle was directed into the peripheral lesion with CT guidance. Two core biopsies were obtained with an 18 gauge core device. One specimen placed in saline for culture and the other was placed in formalin. 17 gauge needle was removed without complication. Bandage placed over the puncture site. FINDINGS: Innumerable pulmonary nodules, predominantly along the pleural surfaces.  Lesion in the lateral right upper lung was biopsied. Small amount of parenchymal hemorrhage following the core biopsies. Negative for pneumothorax. IMPRESSION: CT-guided  core biopsies of right upper lobe pulmonary nodule. Electronically Signed   By: Markus Daft M.D.   On: 12/19/2017 18:36   Dg Chest Port 1 View  Result Date: 01/09/2018 CLINICAL DATA:  Cough EXAM: PORTABLE CHEST 1 VIEW COMPARISON:  January 04, 2018 FINDINGS: There is somewhat less consolidation in the right upper lobe compared to recent study. There is a small right pleural effusion with patchy consolidation in the lateral right base. There is also new subtle consolidation in the medial right base. There is mild atelectatic change in the left mid lung, stable. Heart size and pulmonary vascularity are normal. No adenopathy. No bone lesions. IMPRESSION: 1. New areas of patchy opacity right base. Persistent small right pleural effusion. 2. Less consolidation right upper lobe compared to most recent study. 3.  Stable left midlung atelectasis. 4.  Stable cardiac silhouette. Electronically Signed   By: Lowella Grip III M.D.   On: 01/09/2018 10:24   Dg Chest Port 1 View  Result Date: 01/04/2018 CLINICAL DATA:  Evaluate for pleural effusion. EXAM: PORTABLE CHEST 1 VIEW COMPARISON:  Chest radiograph 01/03/2018. FINDINGS: Monitoring leads overlie the patient. Stable enlarged cardiac and mediastinal contours. Minimal left basilar atelectasis. Persistent moderate right pleural effusion. Interval increase in consolidation throughout the right mid and upper lung. IMPRESSION: Persistent moderate right pleural effusion. Increasing consolidation throughout the mid and upper right lung, potentially secondary to infection. Electronically Signed   By: Lovey Newcomer M.D.   On: 01/04/2018 12:43   Dg Chest Port 1 View  Result Date: 01/03/2018 CLINICAL DATA:  Pleural effusion. EXAM: PORTABLE CHEST 1 VIEW COMPARISON:  Radiograph of January 02, 2018. FINDINGS: Stable  cardiomediastinal silhouette. No pneumothorax or pleural effusion is noted. Stable right upper and lower lobe opacities are noted concerning for pneumonia or atelectasis. Small right pleural effusion is noted. Minimal left basilar subsegmental atelectasis is noted with pleural effusion. Bony thorax is unremarkable. IMPRESSION: Stable right lung opacities are noted concerning for pneumonia or atelectasis. Stable mild right pleural effusion is noted. Minimal left basilar subsegmental atelectasis is noted with minimal left pleural effusion. Electronically Signed   By: Marijo Conception, M.D.   On: 01/03/2018 09:46   Dg Chest Port 1 View  Result Date: 01/02/2018 CLINICAL DATA:  Short of breath EXAM: PORTABLE CHEST 1 VIEW COMPARISON:  01/01/2018, 12/28/2017, 12/19/2017, CT chest 12/08/2017 FINDINGS: Similar appearance of small right pleural effusion and upper lobe right greater than left nodular foci of airspace disease. Slight increased atelectasis or infiltrate at the right base. Stable cardiomediastinal silhouette. No pneumothorax. IMPRESSION: 1. No significant change in small right greater than left pleural effusions and right greater than left upper lobe nodules. Mild diffuse ground-glass opacity in the right upper lobe, no change compared to 01/01/2018, slight increased compared to 12/28/2017. Electronically Signed   By: Donavan Foil M.D.   On: 01/02/2018 19:57   Dg Chest Port 1 View  Result Date: 01/01/2018 CLINICAL DATA:  Cough. EXAM: PORTABLE CHEST 1 VIEW COMPARISON:  12/28/2017 FINDINGS: Lungs are adequately inflated demonstrate persistent minimal patchy opacification of the right mid to upper lung slightly improved with mild linear scarring/atelectasis over the left midlung. Worsening small right pleural effusion. No pneumothorax. Cardiomediastinal silhouette and remainder of the exam is unchanged. IMPRESSION: Minimal patchy opacification over the right mid to upper lung slightly improved with minimal  linear scarring over the left midlung. Worsening small right pleural effusion. Electronically Signed   By: Marin Olp M.D.   On: 01/01/2018 13:57  Dg Chest Port 1 View  Result Date: 12/19/2017 CLINICAL DATA:  Status post lung biopsy. EXAM: PORTABLE CHEST 1 VIEW COMPARISON:  CT scan, same day. FINDINGS: No postprocedural pneumothorax is identified. Stable upper lung predominant nodularity. The cardiac silhouette, mediastinal and hilar contours are stable. No pleural effusion. IMPRESSION: No postprocedural pneumothorax. Electronically Signed   By: Marijo Sanes M.D.   On: 12/19/2017 18:47   [place] Ekg Site Rite  Result Date: 01/07/2018 If Site Rite image not attached, placement could not be confirmed due to current cardiac rhythm.  Ir Thoracentesis Asp Pleural Space W/img Guide  Result Date: 01/03/2018 INDICATION: History pulmonary nodules. Status post image guided biopsy of right lung nodule on 12/19/2017. Patient now with small right-sided pleural effusion. Request for diagnostic and therapeutic thoracentesis. EXAM: ULTRASOUND GUIDED RIGHT THORACENTESIS MEDICATIONS: None. COMPLICATIONS: None immediate. Postprocedural chest x-ray negative for pneumothorax. PROCEDURE: An ultrasound guided thoracentesis was thoroughly discussed with the patient and questions answered. The benefits, risks, alternatives and complications were also discussed. The patient understands and wishes to proceed with the procedure. Written consent was obtained. Ultrasound was performed to localize and mark an very small but adequate pocket of fluid in the right chest. The area was then prepped and draped in the normal sterile fashion. 1% Lidocaine was used for local anesthesia. Under ultrasound guidance a 6 Fr Safe-T-Centesis catheter was introduced. Thoracentesis was performed. The catheter was removed and a dressing applied. FINDINGS: A total of approximately 140 mL of hazy yellow fluid was removed. Samples were sent to the  laboratory as requested by the clinical team. IMPRESSION: Successful ultrasound guided right thoracentesis yielding 140 mL of pleural fluid. Read by: Ascencion Dike PA-C No pneumothorax on follow-up radiography. Electronically Signed   By: Lucrezia Europe M.D.   On: 01/03/2018 15:16    Microbiology: Recent Results (from the past 240 hour(s))  Culture, blood (Routine X 2) w Reflex to ID Panel     Status: None   Collection Time: 01/06/18  2:00 PM  Result Value Ref Range Status   Specimen Description BLOOD RIGHT HAND  Final   Special Requests   Final    BOTTLES DRAWN AEROBIC AND ANAEROBIC Blood Culture adequate volume   Culture   Final    NO GROWTH 5 DAYS Performed at [facility], [place] [address], [place], [place] [address]    Report Status 01/11/2018 FINAL  Final  MRSA PCR Screening     Status: None   Collection Time: 01/07/18  2:58 AM  Result Value Ref Range Status   MRSA by PCR NEGATIVE NEGATIVE Final    Comment:        The GeneXpert MRSA Assay (FDA approved for NASAL specimens only), is one component of a comprehensive MRSA colonization surveillance program. It is not intended to diagnose MRSA infection nor to guide or monitor treatment for MRSA infections. Performed at [facility], [place] [address] [place], [place] [address]      Labs: Basic Metabolic Panel: Recent Labs  Lab 01/09/18 1026  NA 135  K 4.5  CL 99*  CO2 29  GLUCOSE 134*  BUN 37*  CREATININE 1.31*  CALCIUM 8.5*   Liver Function Tests: Recent Labs  Lab 01/09/18 1026  AST 36  ALT 62  ALKPHOS 110  BILITOT 1.1  PROT 6.0*  ALBUMIN 1.6*   No results for input(s): LIPASE, AMYLASE in the last 168 hours. No results for input(s): AMMONIA in the last 168 hours. CBC: No results  for input(s): WBC, NEUTROABS, HGB, HCT, MCV, PLT in the last 168 hours. Cardiac Enzymes: No results for input(s): CKTOTAL, CKMB, CKMBINDEX, TROPONINI in the last 168 hours. BNP: BNP (last 3 results) No results for  input(s): BNP in the last 8760 hours.  ProBNP (last 3 results) No results for input(s): PROBNP in the last 8760 hours.  CBG: No results for input(s): GLUCAP in the last 168 hours.     Signed:  Elwin Mocha MD   [facility] 01/15/2018, 12:27 PM

## 2018-01-15 NOTE — Progress Notes (Signed)
Paged the MD for him to sign the Pt's DNR.

## 2018-01-15 NOTE — Progress Notes (Signed)
[facility])  [place] room available for Mr. Steinhart today. Spoke with daughter by phone to confirm desire to transfer today. She plans to meet me today at [facility] at 11:30 to complete paper work. Updated CSW Michiel Cowboy re this plan.   Will need discharge summary sent to [phone].  RN please call report to [phone].  Thank you,  Erling Conte, LCSW  [phone]

## 2018-01-15 NOTE — Social Work (Signed)
DNR signed on chart. Discharge summary faxed to [facility].   Clinical Social Worker facilitated patient discharge including contacting patient family and facility to confirm patient discharge plans.  Clinical information faxed to facility and family agreeable with plan.  CSW arranged ambulance transport via [facility] to [facility].  RN to call [phone] with report prior to discharge.  Clinical Social Worker will sign off for now as social work intervention is no longer needed. Please consult [place] again if new need arises.  Alexander Mt, University Social Worker [phone]

## 2018-01-15 NOTE — Care Management Important Message (Signed)
Important Message  Patient Details  Name: [name] MRN: [id number] Date of Birth: December 08, 1950   Medicare Important Message Given:  Yes    Barb Merino Sharis Keeran 01/15/2018, 3:57 PM

## 2018-01-24 ENCOUNTER — Ambulatory Visit (HOSPITAL_COMMUNITY): Admission: RE | Admit: 2018-01-24 | Payer: Medicare Other | Source: Ambulatory Visit | Admitting: Urology

## 2018-01-24 SURGERY — PROSTATECTOMY, RADICAL, ROBOT-ASSISTED, LAPAROSCOPIC
Anesthesia: General

## 2018-01-28 LAB — ACID FAST CULTURE WITH REFLEXED SENSITIVITIES (MYCOBACTERIA): Acid Fast Culture: NEGATIVE

## 2018-01-28 LAB — ACID FAST CULTURE WITH REFLEXED SENSITIVITIES: ACID FAST CULTURE - AFSCU3: NEGATIVE

## 2018-01-31 LAB — RAPID GROWER BROTH SUSCEP.
Ciprofloxacin: 0.5
Tigecycline: 0.25

## 2018-01-31 LAB — AFB ORGANISM ID BY DNA PROBE
M TUBERCULOSIS COMPLEX: NEGATIVE
M avium complex: NEGATIVE

## 2018-01-31 LAB — ORG ID BY SEQUENCING RFLX AST

## 2018-01-31 LAB — ACID FAST CULTURE WITH REFLEXED SENSITIVITIES

## 2018-01-31 LAB — ACID FAST CULTURE WITH REFLEXED SENSITIVITIES (MYCOBACTERIA): Acid Fast Culture: POSITIVE — AB

## 2018-02-01 LAB — ACID FAST CULTURE WITH REFLEXED SENSITIVITIES (MYCOBACTERIA): Acid Fast Culture: NEGATIVE

## 2018-02-01 LAB — ACID FAST CULTURE WITH REFLEXED SENSITIVITIES

## 2018-02-07 LAB — FUNGAL ORGANISM REFLEX

## 2018-02-07 LAB — FUNGUS CULTURE WITH STAIN

## 2018-02-07 LAB — FUNGUS CULTURE RESULT

## 2018-02-08 DEATH — deceased

## 2018-02-18 LAB — ACID FAST CULTURE WITH REFLEXED SENSITIVITIES (MYCOBACTERIA): Acid Fast Culture: NEGATIVE

## 2018-10-28 IMAGING — CT CT HEAD W/O CM
3 of 4 series · 14 of 47 positions shown, 16 images · non-contrast
Comparison: Brain MRI and intracranial MRA 01/07/2018. Head CT
01/06/2018, and earlier.

ADDENDUM:
Critical Value/emergent results were called by telephone at the time
of interpretation on 01/09/2018 at 7766 hours to Dr. HEI KE BENDEL ,
who verbally acknowledged these results.

We discussed the consideration of septic emboli given this
constellation of findings, although so far it seems to be only the
one thalamic lacune which has hemorrhaged.
CLINICAL DATA: 66-year-old male with small acute intra-axial
hemorrhage in the right thalamus detected on noncontrast CT, and
multiple scattered acute infarcts in both hemispheres on MRI.
Increased confusion today.
EXAM:
CT HEAD WITHOUT CONTRAST
TECHNIQUE: Contiguous axial images were obtained from the base of the skull
through the vertex without intravenous contrast.

[Series 4: head 2.0 h70h · axial · 0.43mm/px · z∈[-89,+39]mm · 8 of 82 slices shown, 10 images]
[im 9/82  brain]
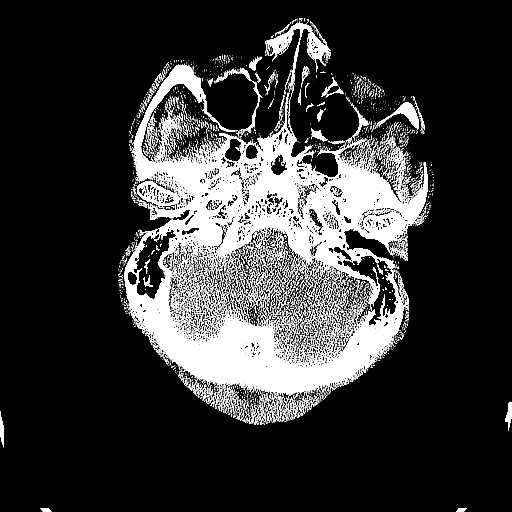
[im 9/82  bone]
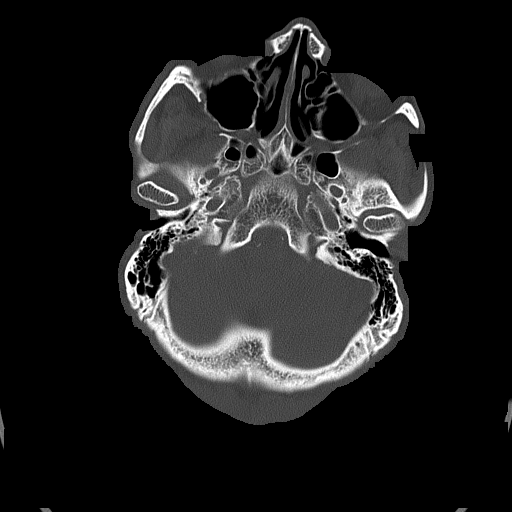
[im 17/82  brain]
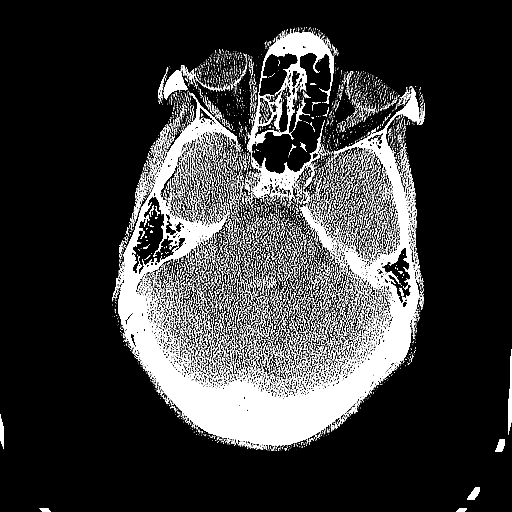
[im 25/82  brain]
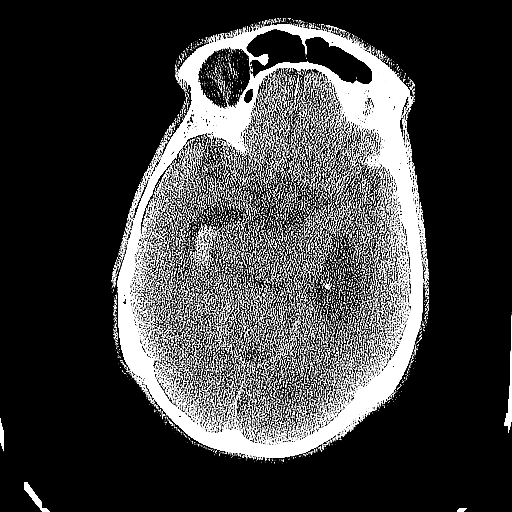
[im 37/82  brain]
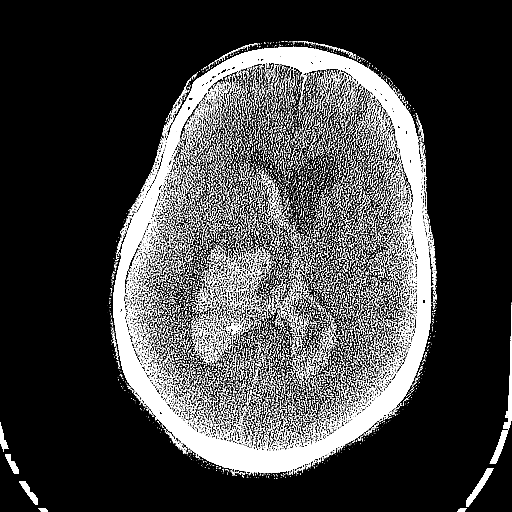
[im 45/82  brain]
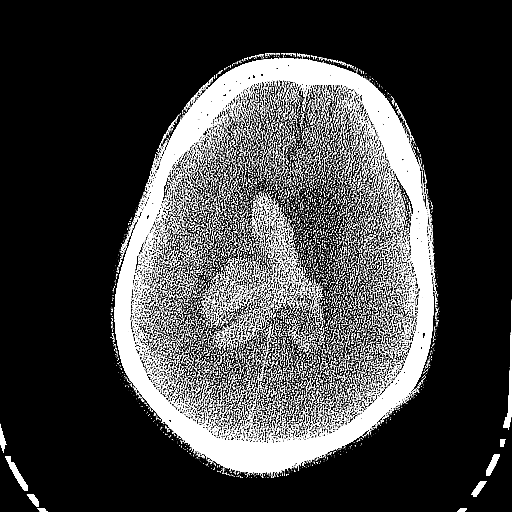
[im 45/82  bone]
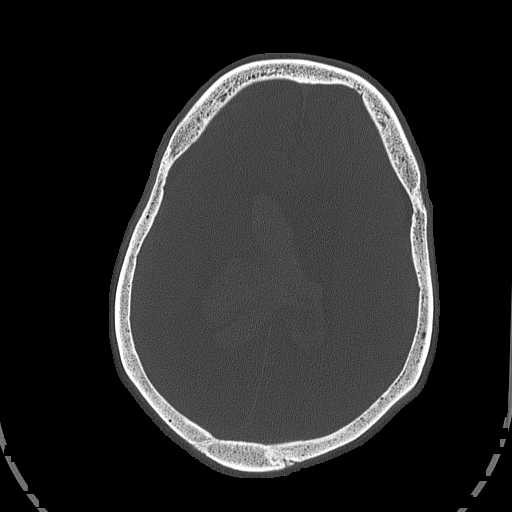
[im 57/82  brain]
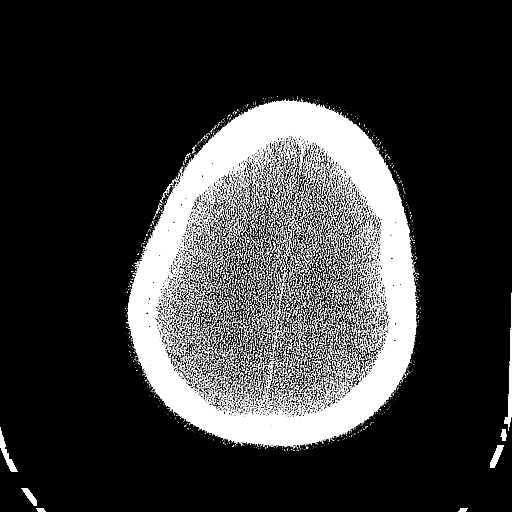
[im 65/82  brain]
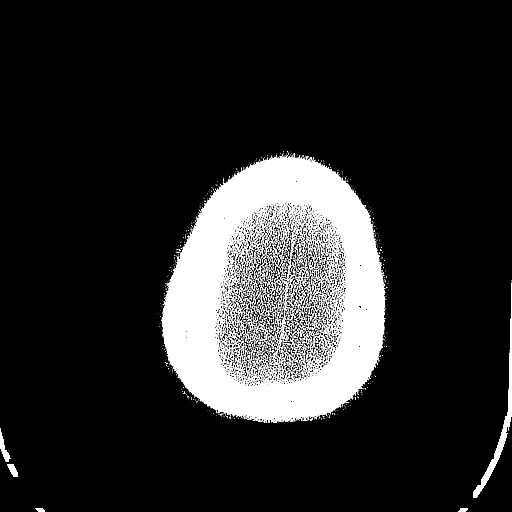
[im 73/82  brain]
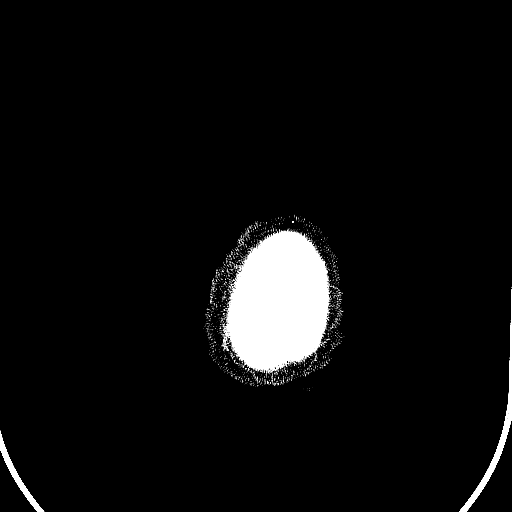

[Series 5: head 3.0 mpr cor · coronal · 0.31mm/px · 3 of 68 slices shown]
[im 23/68  brain]
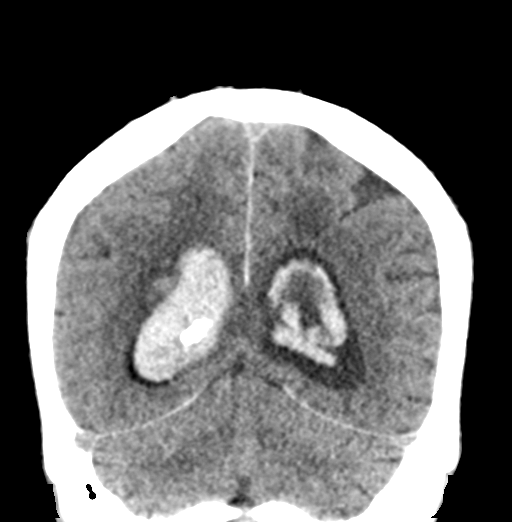
[im 30/68  brain]
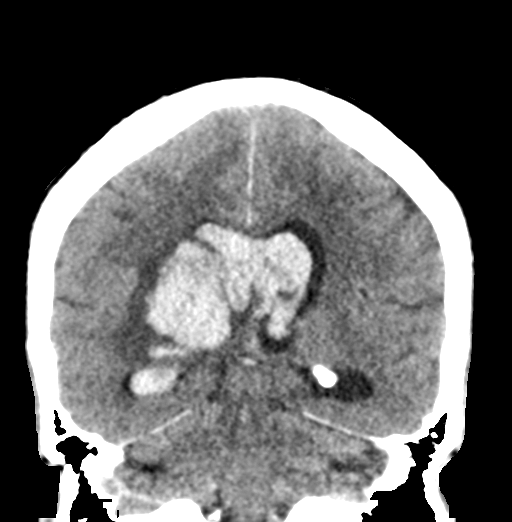
[im 38/68  brain]
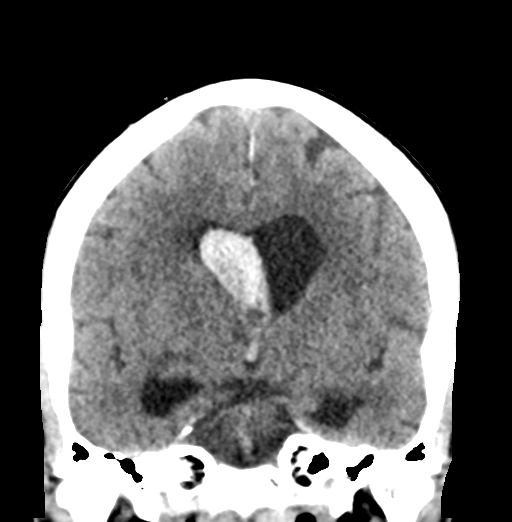

[Series 6: head 3.0 mpr sag · sagittal · 0.32mm/px · 3 of 52 slices shown]
[im 18/52  brain]
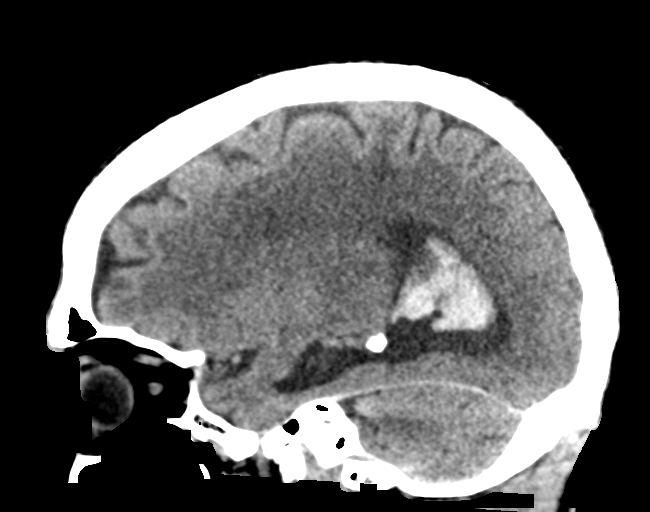
[im 26/52  brain]
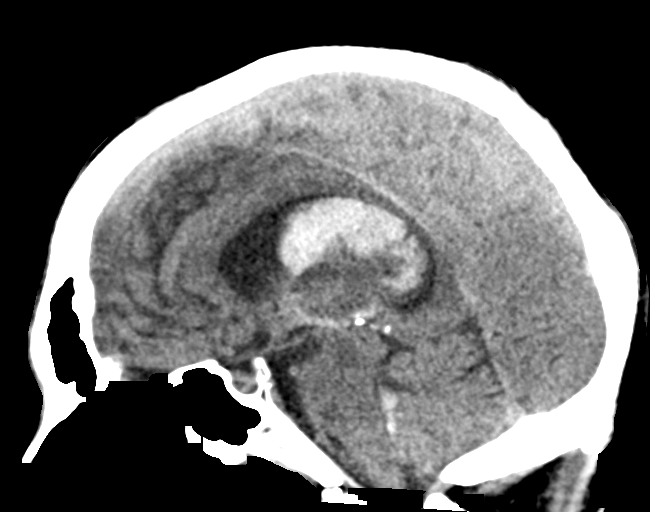
[im 35/52  brain]
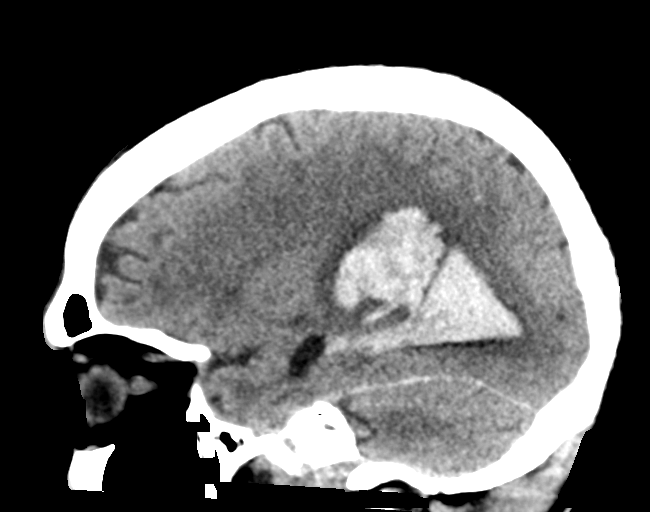

[14 of 47 positions shown; findings below may reference images not displayed]

FINDINGS: Brain: Substantial progression of the right thalamic hemorrhage
since 01/07/2018. New moderate to large intraventricular extension
of blood.

Estimated intra-axial blood volume is now 20 milliliters (versus
approximately 1 milliliter on the initial head CT), now encompassing
38 x 30 x 35 millimeters (AP by transverse by CC). Confluent
surrounding edema. New mild ventriculomegaly including evidence of
transependymal edema along the left temporal horn. New leftward
midline shift of 9 millimeters.

Basilar cisterns remain patent. The superimposed small scattered
bilateral mostly white matter infarcts appear stable to the
diffusion imaging on 01/07/2018. No superimposed cortical cytotoxic
edema is identified.

Vascular: Calcified atherosclerosis at the skull base.

Skull: No acute osseous abnormality identified.

Sinuses/Orbits: Stable and clear aside from unchanged posterior
right ethmoid opacification.

Other: Mildly Disconjugate gaze. No other acute orbit or scalp soft
tissue finding.
IMPRESSION: 1. Substantial progression of the right thalamic hemorrhage since
01/06/2018 with intra-axial blood volume now estimated at 20 mL.
Confluent surrounding edema and new leftward midline shift of 9 mm.
2. New moderate to large intraventricular extension of hemorrhage
with acute ventriculomegaly and mild transependymal edema.
3. Basilar cisterns remain normal.
4. Scattered small infarcts demonstrated by MRI appears stable by
CT. No new infarct identified.
